# Patient Record
Sex: Male | Born: 1940 | Race: White | Hispanic: No | Marital: Married | Smoking: Former smoker
Health system: Southern US, Community
[De-identification: ages and names within clinical notes are randomized; demographics above are authoritative.]

## PROBLEM LIST (undated history)

## (undated) DIAGNOSIS — M519 Unspecified thoracic, thoracolumbar and lumbosacral intervertebral disc disorder: Secondary | ICD-10-CM

## (undated) DIAGNOSIS — N200 Calculus of kidney: Secondary | ICD-10-CM

## (undated) DIAGNOSIS — M069 Rheumatoid arthritis, unspecified: Secondary | ICD-10-CM

## (undated) DIAGNOSIS — I499 Cardiac arrhythmia, unspecified: Secondary | ICD-10-CM

## (undated) DIAGNOSIS — K279 Peptic ulcer, site unspecified, unspecified as acute or chronic, without hemorrhage or perforation: Secondary | ICD-10-CM

## (undated) DIAGNOSIS — E119 Type 2 diabetes mellitus without complications: Secondary | ICD-10-CM

## (undated) DIAGNOSIS — K219 Gastro-esophageal reflux disease without esophagitis: Secondary | ICD-10-CM

## (undated) DIAGNOSIS — G20A1 Parkinson's disease without dyskinesia, without mention of fluctuations: Secondary | ICD-10-CM

## (undated) DIAGNOSIS — J45909 Unspecified asthma, uncomplicated: Secondary | ICD-10-CM

## (undated) DIAGNOSIS — Z86711 Personal history of pulmonary embolism: Secondary | ICD-10-CM

## (undated) DIAGNOSIS — D472 Monoclonal gammopathy: Secondary | ICD-10-CM

## (undated) DIAGNOSIS — I219 Acute myocardial infarction, unspecified: Secondary | ICD-10-CM

## (undated) DIAGNOSIS — E039 Hypothyroidism, unspecified: Secondary | ICD-10-CM

## (undated) DIAGNOSIS — I2510 Atherosclerotic heart disease of native coronary artery without angina pectoris: Secondary | ICD-10-CM

## (undated) DIAGNOSIS — Z8249 Family history of ischemic heart disease and other diseases of the circulatory system: Secondary | ICD-10-CM

## (undated) DIAGNOSIS — I10 Essential (primary) hypertension: Secondary | ICD-10-CM

## (undated) DIAGNOSIS — K635 Polyp of colon: Secondary | ICD-10-CM

## (undated) DIAGNOSIS — M1990 Unspecified osteoarthritis, unspecified site: Secondary | ICD-10-CM

## (undated) DIAGNOSIS — E785 Hyperlipidemia, unspecified: Secondary | ICD-10-CM

## (undated) DIAGNOSIS — G20 Parkinson's disease: Secondary | ICD-10-CM

## (undated) DIAGNOSIS — D649 Anemia, unspecified: Secondary | ICD-10-CM

## (undated) DIAGNOSIS — Z87442 Personal history of urinary calculi: Secondary | ICD-10-CM

## (undated) DIAGNOSIS — N289 Disorder of kidney and ureter, unspecified: Secondary | ICD-10-CM

## (undated) DIAGNOSIS — Z86718 Personal history of other venous thrombosis and embolism: Secondary | ICD-10-CM

## (undated) DIAGNOSIS — R296 Repeated falls: Secondary | ICD-10-CM

## (undated) HISTORY — PX: CORONARY ANGIOPLASTY: SHX604

## (undated) HISTORY — DX: Family history of ischemic heart disease and other diseases of the circulatory system: Z82.49

## (undated) HISTORY — PX: JOINT REPLACEMENT: SHX530

## (undated) HISTORY — DX: Unspecified osteoarthritis, unspecified site: M19.90

## (undated) HISTORY — PX: ABLATION: SHX5711

## (undated) HISTORY — DX: Monoclonal gammopathy: D47.2

## (undated) HISTORY — DX: Polyp of colon: K63.5

## (undated) HISTORY — PX: NASAL/SINUS ENDOSCOPY: SHX288

## (undated) HISTORY — DX: Atherosclerotic heart disease of native coronary artery without angina pectoris: I25.10

## (undated) HISTORY — PX: REPLACEMENT TOTAL KNEE BILATERAL: SUR1225

## (undated) HISTORY — PX: CORONARY ARTERY BYPASS GRAFT: SHX141

## (undated) HISTORY — DX: Anemia, unspecified: D64.9

## (undated) HISTORY — DX: Personal history of other venous thrombosis and embolism: Z86.718

## (undated) HISTORY — DX: Gastro-esophageal reflux disease without esophagitis: K21.9

## (undated) HISTORY — PX: TOTAL HIP ARTHROPLASTY: SHX124

## (undated) HISTORY — PX: EYE SURGERY: SHX253

## (undated) HISTORY — DX: Disorder of kidney and ureter, unspecified: N28.9

## (undated) HISTORY — DX: Unspecified thoracic, thoracolumbar and lumbosacral intervertebral disc disorder: M51.9

## (undated) HISTORY — PX: COLONOSCOPY: SHX174

## (undated) HISTORY — DX: Peptic ulcer, site unspecified, unspecified as acute or chronic, without hemorrhage or perforation: K27.9

## (undated) HISTORY — DX: Hyperlipidemia, unspecified: E78.5

## (undated) HISTORY — DX: Rheumatoid arthritis, unspecified: M06.9

## (undated) HISTORY — PX: CARDIAC CATHETERIZATION: SHX172

## (undated) HISTORY — DX: Personal history of pulmonary embolism: Z86.711

## (undated) HISTORY — DX: Calculus of kidney: N20.0

## (undated) HISTORY — PX: CHOLECYSTECTOMY: SHX55

---

## 1996-03-17 DIAGNOSIS — Z8249 Family history of ischemic heart disease and other diseases of the circulatory system: Secondary | ICD-10-CM

## 1996-03-17 HISTORY — DX: Family history of ischemic heart disease and other diseases of the circulatory system: Z82.49

## 2004-02-19 ENCOUNTER — Ambulatory Visit: Payer: Self-pay | Admitting: Unknown Physician Specialty

## 2004-06-14 ENCOUNTER — Ambulatory Visit: Payer: Self-pay | Admitting: Otolaryngology

## 2004-06-19 ENCOUNTER — Ambulatory Visit: Payer: Self-pay | Admitting: Otolaryngology

## 2004-10-11 ENCOUNTER — Emergency Department: Payer: Self-pay | Admitting: General Practice

## 2004-10-23 ENCOUNTER — Ambulatory Visit: Payer: Self-pay | Admitting: Internal Medicine

## 2004-12-27 ENCOUNTER — Ambulatory Visit: Payer: Self-pay | Admitting: Unknown Physician Specialty

## 2005-01-10 ENCOUNTER — Ambulatory Visit: Payer: Self-pay | Admitting: Unknown Physician Specialty

## 2005-05-24 ENCOUNTER — Ambulatory Visit: Payer: Self-pay | Admitting: Ophthalmology

## 2006-06-22 ENCOUNTER — Other Ambulatory Visit: Payer: Self-pay

## 2006-06-22 ENCOUNTER — Emergency Department: Payer: Self-pay | Admitting: Emergency Medicine

## 2006-06-24 ENCOUNTER — Ambulatory Visit: Payer: Self-pay

## 2006-06-29 ENCOUNTER — Ambulatory Visit: Payer: Self-pay

## 2006-07-13 ENCOUNTER — Ambulatory Visit: Payer: Self-pay | Admitting: Rheumatology

## 2006-12-03 ENCOUNTER — Ambulatory Visit: Payer: Self-pay | Admitting: Cardiology

## 2007-02-01 ENCOUNTER — Other Ambulatory Visit: Payer: Self-pay

## 2007-02-01 ENCOUNTER — Ambulatory Visit: Payer: Self-pay | Admitting: Ophthalmology

## 2007-02-16 ENCOUNTER — Ambulatory Visit: Payer: Self-pay | Admitting: Ophthalmology

## 2007-03-18 DIAGNOSIS — Z86718 Personal history of other venous thrombosis and embolism: Secondary | ICD-10-CM

## 2007-03-18 HISTORY — DX: Personal history of other venous thrombosis and embolism: Z86.718

## 2007-03-22 ENCOUNTER — Ambulatory Visit: Payer: Self-pay | Admitting: Unknown Physician Specialty

## 2007-04-27 ENCOUNTER — Ambulatory Visit: Payer: Self-pay | Admitting: Ophthalmology

## 2007-05-11 ENCOUNTER — Ambulatory Visit: Payer: Self-pay | Admitting: Ophthalmology

## 2007-09-06 ENCOUNTER — Ambulatory Visit: Payer: Self-pay | Admitting: General Practice

## 2007-09-21 ENCOUNTER — Inpatient Hospital Stay: Payer: Self-pay | Admitting: General Practice

## 2007-11-19 ENCOUNTER — Inpatient Hospital Stay: Payer: Self-pay | Admitting: Specialist

## 2007-11-22 ENCOUNTER — Other Ambulatory Visit: Payer: Self-pay

## 2008-05-18 ENCOUNTER — Ambulatory Visit: Payer: Self-pay | Admitting: Vascular Surgery

## 2008-08-15 DIAGNOSIS — Z86711 Personal history of pulmonary embolism: Secondary | ICD-10-CM

## 2008-08-15 HISTORY — DX: Personal history of pulmonary embolism: Z86.711

## 2008-09-14 ENCOUNTER — Ambulatory Visit: Payer: Self-pay | Admitting: Internal Medicine

## 2008-10-03 ENCOUNTER — Ambulatory Visit: Payer: Self-pay | Admitting: Internal Medicine

## 2008-10-15 ENCOUNTER — Ambulatory Visit: Payer: Self-pay | Admitting: Internal Medicine

## 2009-01-03 ENCOUNTER — Ambulatory Visit: Payer: Self-pay | Admitting: Internal Medicine

## 2009-01-15 ENCOUNTER — Ambulatory Visit: Payer: Self-pay | Admitting: Internal Medicine

## 2009-04-05 ENCOUNTER — Ambulatory Visit: Payer: Self-pay | Admitting: Internal Medicine

## 2009-04-09 ENCOUNTER — Ambulatory Visit: Payer: Self-pay

## 2009-04-17 ENCOUNTER — Ambulatory Visit: Payer: Self-pay | Admitting: Internal Medicine

## 2009-05-16 ENCOUNTER — Ambulatory Visit: Payer: Self-pay | Admitting: Pain Medicine

## 2009-06-15 ENCOUNTER — Ambulatory Visit: Payer: Self-pay | Admitting: Internal Medicine

## 2009-07-04 ENCOUNTER — Ambulatory Visit: Payer: Self-pay | Admitting: Internal Medicine

## 2009-07-15 ENCOUNTER — Ambulatory Visit: Payer: Self-pay | Admitting: Internal Medicine

## 2009-08-02 ENCOUNTER — Ambulatory Visit: Payer: Self-pay | Admitting: Internal Medicine

## 2009-08-06 ENCOUNTER — Ambulatory Visit: Payer: Self-pay | Admitting: Internal Medicine

## 2009-08-15 ENCOUNTER — Ambulatory Visit: Payer: Self-pay | Admitting: Internal Medicine

## 2009-08-20 ENCOUNTER — Encounter: Admission: RE | Admit: 2009-08-20 | Discharge: 2009-08-20 | Payer: Self-pay | Admitting: Neurosurgery

## 2009-09-04 ENCOUNTER — Encounter: Admission: RE | Admit: 2009-09-04 | Discharge: 2009-09-04 | Payer: Self-pay | Admitting: Neurosurgery

## 2010-01-15 ENCOUNTER — Ambulatory Visit: Payer: Self-pay | Admitting: Internal Medicine

## 2010-08-21 ENCOUNTER — Ambulatory Visit: Payer: Self-pay | Admitting: General Practice

## 2010-08-30 ENCOUNTER — Ambulatory Visit: Payer: Self-pay | Admitting: Vascular Surgery

## 2010-09-02 ENCOUNTER — Inpatient Hospital Stay: Payer: Self-pay | Admitting: General Practice

## 2011-01-21 ENCOUNTER — Ambulatory Visit: Payer: Self-pay | Admitting: Vascular Surgery

## 2012-07-20 ENCOUNTER — Ambulatory Visit: Payer: Self-pay | Admitting: Neurology

## 2012-09-06 ENCOUNTER — Encounter: Payer: Self-pay | Admitting: Otolaryngology

## 2012-09-14 ENCOUNTER — Encounter: Payer: Self-pay | Admitting: Otolaryngology

## 2012-10-15 ENCOUNTER — Encounter: Payer: Self-pay | Admitting: Otolaryngology

## 2012-10-21 ENCOUNTER — Ambulatory Visit: Payer: Self-pay | Admitting: Cardiology

## 2012-10-27 ENCOUNTER — Ambulatory Visit: Payer: Self-pay | Admitting: Internal Medicine

## 2012-11-15 ENCOUNTER — Encounter: Payer: Self-pay | Admitting: Otolaryngology

## 2012-12-08 ENCOUNTER — Ambulatory Visit: Payer: Self-pay | Admitting: Unknown Physician Specialty

## 2012-12-20 ENCOUNTER — Other Ambulatory Visit: Payer: Self-pay | Admitting: Unknown Physician Specialty

## 2012-12-22 LAB — STOOL CULTURE

## 2013-01-26 ENCOUNTER — Ambulatory Visit: Payer: Self-pay | Admitting: Physical Medicine and Rehabilitation

## 2013-06-10 ENCOUNTER — Ambulatory Visit: Payer: Self-pay | Admitting: Internal Medicine

## 2013-06-21 ENCOUNTER — Ambulatory Visit: Payer: Self-pay | Admitting: Internal Medicine

## 2013-06-23 LAB — KAPPA/LAMBDA FREE LIGHT CHAINS (ARMC)

## 2013-06-23 LAB — PROT IMMUNOELECTROPHORES(ARMC)

## 2013-06-30 ENCOUNTER — Ambulatory Visit: Payer: Self-pay | Admitting: Unknown Physician Specialty

## 2013-07-01 LAB — PATHOLOGY REPORT

## 2013-07-15 ENCOUNTER — Ambulatory Visit: Payer: Self-pay | Admitting: Internal Medicine

## 2014-06-23 ENCOUNTER — Ambulatory Visit
Admit: 2014-06-23 | Disposition: A | Discharge status: Home / Self Care | Payer: Self-pay | Attending: Internal Medicine | Admitting: Internal Medicine

## 2014-06-23 LAB — CBC CANCER CENTER
BASOS ABS: 0.1 x10 3/mm (ref 0.0–0.1)
Basophil %: 1 %
EOS ABS: 0.2 x10 3/mm (ref 0.0–0.7)
Eosinophil %: 3.2 %
HCT: 41.8 % (ref 40.0–52.0)
HGB: 14 g/dL (ref 13.0–18.0)
LYMPHS ABS: 1.3 x10 3/mm (ref 1.0–3.6)
Lymphocyte %: 18.1 %
MCH: 31.3 pg (ref 26.0–34.0)
MCHC: 33.5 g/dL (ref 32.0–36.0)
MCV: 93 fL (ref 80–100)
MONO ABS: 0.7 x10 3/mm (ref 0.2–1.0)
Monocyte %: 9.4 %
NEUTROS PCT: 68.3 %
Neutrophil #: 5 x10 3/mm (ref 1.4–6.5)
PLATELETS: 217 x10 3/mm (ref 150–440)
RBC: 4.49 10*6/uL (ref 4.40–5.90)
RDW: 15.4 % — AB (ref 11.5–14.5)
WBC: 7.3 x10 3/mm (ref 3.8–10.6)

## 2014-06-23 LAB — URINALYSIS, COMPLETE
Bilirubin,UR: NEGATIVE
Blood: NEGATIVE
Glucose,UR: 50 mg/dL (ref 0–75)
KETONE: NEGATIVE
Nitrite: NEGATIVE
PH: 5 (ref 4.5–8.0)
SPECIFIC GRAVITY: 1.018 (ref 1.003–1.030)

## 2014-06-23 LAB — CALCIUM: Calcium, Total: 9.7 mg/dL

## 2014-06-23 LAB — CREATININE, SERUM
Creatinine: 1.59 mg/dL — ABNORMAL HIGH
EGFR (Non-African Amer.): 42 — ABNORMAL LOW
GFR CALC AF AMER: 48 — AB

## 2014-06-25 LAB — URINE CULTURE

## 2014-06-26 LAB — PROT IMMUNOELECTROPHORES(ARMC)

## 2015-06-18 ENCOUNTER — Emergency Department: Payer: Medicare Other

## 2015-06-18 ENCOUNTER — Inpatient Hospital Stay
Admission: EM | Admit: 2015-06-18 | Discharge: 2015-06-20 | DRG: 871 | Disposition: A | Discharge status: Home Health | Payer: Medicare Other | Attending: Internal Medicine | Admitting: Internal Medicine

## 2015-06-18 ENCOUNTER — Encounter: Payer: Self-pay | Admitting: Emergency Medicine

## 2015-06-18 DIAGNOSIS — I129 Hypertensive chronic kidney disease with stage 1 through stage 4 chronic kidney disease, or unspecified chronic kidney disease: Secondary | ICD-10-CM | POA: Diagnosis present

## 2015-06-18 DIAGNOSIS — Z7951 Long term (current) use of inhaled steroids: Secondary | ICD-10-CM | POA: Diagnosis not present

## 2015-06-18 DIAGNOSIS — N170 Acute kidney failure with tubular necrosis: Secondary | ICD-10-CM | POA: Diagnosis present

## 2015-06-18 DIAGNOSIS — E1122 Type 2 diabetes mellitus with diabetic chronic kidney disease: Secondary | ICD-10-CM | POA: Diagnosis present

## 2015-06-18 DIAGNOSIS — N183 Chronic kidney disease, stage 3 (moderate): Secondary | ICD-10-CM | POA: Diagnosis present

## 2015-06-18 DIAGNOSIS — Z7984 Long term (current) use of oral hypoglycemic drugs: Secondary | ICD-10-CM

## 2015-06-18 DIAGNOSIS — E872 Acidosis: Secondary | ICD-10-CM | POA: Diagnosis present

## 2015-06-18 DIAGNOSIS — Z7952 Long term (current) use of systemic steroids: Secondary | ICD-10-CM | POA: Diagnosis not present

## 2015-06-18 DIAGNOSIS — M069 Rheumatoid arthritis, unspecified: Secondary | ICD-10-CM | POA: Diagnosis present

## 2015-06-18 DIAGNOSIS — A419 Sepsis, unspecified organism: Secondary | ICD-10-CM | POA: Diagnosis present

## 2015-06-18 DIAGNOSIS — A047 Enterocolitis due to Clostridium difficile: Secondary | ICD-10-CM | POA: Diagnosis present

## 2015-06-18 DIAGNOSIS — A414 Sepsis due to anaerobes: Secondary | ICD-10-CM | POA: Diagnosis present

## 2015-06-18 DIAGNOSIS — Z794 Long term (current) use of insulin: Secondary | ICD-10-CM

## 2015-06-18 DIAGNOSIS — Z881 Allergy status to other antibiotic agents status: Secondary | ICD-10-CM | POA: Diagnosis not present

## 2015-06-18 DIAGNOSIS — N400 Enlarged prostate without lower urinary tract symptoms: Secondary | ICD-10-CM | POA: Diagnosis present

## 2015-06-18 DIAGNOSIS — R159 Full incontinence of feces: Secondary | ICD-10-CM | POA: Diagnosis present

## 2015-06-18 DIAGNOSIS — R6520 Severe sepsis without septic shock: Secondary | ICD-10-CM | POA: Diagnosis present

## 2015-06-18 DIAGNOSIS — I714 Abdominal aortic aneurysm, without rupture: Secondary | ICD-10-CM | POA: Diagnosis present

## 2015-06-18 DIAGNOSIS — Z79899 Other long term (current) drug therapy: Secondary | ICD-10-CM

## 2015-06-18 DIAGNOSIS — E876 Hypokalemia: Secondary | ICD-10-CM | POA: Diagnosis present

## 2015-06-18 DIAGNOSIS — I4891 Unspecified atrial fibrillation: Secondary | ICD-10-CM | POA: Diagnosis present

## 2015-06-18 DIAGNOSIS — N390 Urinary tract infection, site not specified: Secondary | ICD-10-CM | POA: Diagnosis present

## 2015-06-18 DIAGNOSIS — I2510 Atherosclerotic heart disease of native coronary artery without angina pectoris: Secondary | ICD-10-CM | POA: Diagnosis present

## 2015-06-18 DIAGNOSIS — Z7982 Long term (current) use of aspirin: Secondary | ICD-10-CM

## 2015-06-18 DIAGNOSIS — Z888 Allergy status to other drugs, medicaments and biological substances status: Secondary | ICD-10-CM

## 2015-06-18 DIAGNOSIS — R1084 Generalized abdominal pain: Secondary | ICD-10-CM

## 2015-06-18 HISTORY — DX: Type 2 diabetes mellitus without complications: E11.9

## 2015-06-18 HISTORY — DX: Essential (primary) hypertension: I10

## 2015-06-18 LAB — COMPREHENSIVE METABOLIC PANEL
ALBUMIN: 3.8 g/dL (ref 3.5–5.0)
ALK PHOS: 50 U/L (ref 38–126)
ALT: 29 U/L (ref 17–63)
AST: 33 U/L (ref 15–41)
Anion gap: 11 (ref 5–15)
BUN: 38 mg/dL — ABNORMAL HIGH (ref 6–20)
CALCIUM: 10.1 mg/dL (ref 8.9–10.3)
CHLORIDE: 105 mmol/L (ref 101–111)
CO2: 21 mmol/L — AB (ref 22–32)
CREATININE: 1.75 mg/dL — AB (ref 0.61–1.24)
GFR calc non Af Amer: 36 mL/min — ABNORMAL LOW (ref >60)
GFR, EST AFRICAN AMERICAN: 42 mL/min — AB (ref >60)
GLUCOSE: 246 mg/dL — AB (ref 65–99)
Potassium: 2.7 mmol/L — CL (ref 3.5–5.1)
SODIUM: 137 mmol/L (ref 135–145)
Total Bilirubin: 0.9 mg/dL (ref 0.3–1.2)
Total Protein: 7.2 g/dL (ref 6.5–8.1)

## 2015-06-18 LAB — URINALYSIS COMPLETE WITH MICROSCOPIC (ARMC ONLY)
Bilirubin Urine: NEGATIVE
Glucose, UA: 50 mg/dL — AB
KETONES UR: NEGATIVE mg/dL
NITRITE: NEGATIVE
PH: 5 (ref 5.0–8.0)
PROTEIN: 100 mg/dL — AB
SPECIFIC GRAVITY, URINE: 1.025 (ref 1.005–1.030)
Squamous Epithelial / LPF: NONE SEEN

## 2015-06-18 LAB — CBC WITH DIFFERENTIAL/PLATELET
BASOS PCT: 0 %
Basophils Absolute: 0.1 10*3/uL (ref 0–0.1)
EOS ABS: 0.1 10*3/uL (ref 0–0.7)
Eosinophils Relative: 1 %
HCT: 47.5 % (ref 40.0–52.0)
HEMOGLOBIN: 16 g/dL (ref 13.0–18.0)
LYMPHS ABS: 0.4 10*3/uL — AB (ref 1.0–3.6)
Lymphocytes Relative: 3 %
MCH: 30.3 pg (ref 26.0–34.0)
MCHC: 33.8 g/dL (ref 32.0–36.0)
MCV: 89.8 fL (ref 80.0–100.0)
MONOS PCT: 4 %
Monocytes Absolute: 0.5 10*3/uL (ref 0.2–1.0)
NEUTROS PCT: 92 %
Neutro Abs: 13.6 10*3/uL — ABNORMAL HIGH (ref 1.4–6.5)
Platelets: 202 10*3/uL (ref 150–440)
RBC: 5.29 MIL/uL (ref 4.40–5.90)
RDW: 16.7 % — ABNORMAL HIGH (ref 11.5–14.5)
WBC: 14.7 10*3/uL — ABNORMAL HIGH (ref 3.8–10.6)

## 2015-06-18 LAB — GLUCOSE, CAPILLARY: Glucose-Capillary: 151 mg/dL — ABNORMAL HIGH (ref 65–99)

## 2015-06-18 LAB — LIPASE, BLOOD: Lipase: 42 U/L (ref 11–51)

## 2015-06-18 LAB — RAPID INFLUENZA A&B ANTIGENS
Influenza A (ARMC): NEGATIVE
Influenza B (ARMC): NEGATIVE

## 2015-06-18 LAB — LACTIC ACID, PLASMA
Lactic Acid, Venous: 2.6 mmol/L (ref 0.5–2.0)
Lactic Acid, Venous: 3.6 mmol/L (ref 0.5–2.0)

## 2015-06-18 LAB — TROPONIN I: Troponin I: 0.08 ng/mL — ABNORMAL HIGH (ref <0.031)

## 2015-06-18 MED ORDER — DIATRIZOATE MEGLUMINE & SODIUM 66-10 % PO SOLN
15.0000 mL | Freq: Once | ORAL | Status: AC
  Administered 2015-06-18: 15 mL via ORAL

## 2015-06-18 MED ORDER — METHOTREXATE 2.5 MG PO TABS
12.5000 mg | ORAL_TABLET | ORAL | Status: DC
  Administered 2015-06-20: 12.5 mg via ORAL
  Filled 2015-06-18: qty 5

## 2015-06-18 MED ORDER — ENOXAPARIN SODIUM 40 MG/0.4ML ~~LOC~~ SOLN: 40.0000 mg | SUBCUTANEOUS | Status: DC

## 2015-06-18 MED ORDER — VANCOMYCIN 50 MG/ML ORAL SOLUTION
125.0000 mg | Freq: Four times a day (QID) | ORAL | Status: DC
  Administered 2015-06-19 (×2): 125 mg via ORAL
  Filled 2015-06-18 (×8): qty 2.5

## 2015-06-18 MED ORDER — FLUTICASONE PROPIONATE 50 MCG/ACT NA SUSP
2.0000 | Freq: Every day | NASAL | Status: DC
  Administered 2015-06-19 – 2015-06-20 (×2): 2 via NASAL
  Filled 2015-06-18 (×2): qty 16

## 2015-06-18 MED ORDER — ACETAMINOPHEN 325 MG PO TABS: 650.0000 mg | ORAL_TABLET | Freq: Four times a day (QID) | ORAL | Status: DC | PRN

## 2015-06-18 MED ORDER — LORATADINE 10 MG PO TABS
10.0000 mg | ORAL_TABLET | Freq: Every day | ORAL | Status: DC
  Administered 2015-06-19 – 2015-06-20 (×2): 10 mg via ORAL
  Filled 2015-06-18 (×2): qty 1

## 2015-06-18 MED ORDER — ONDANSETRON HCL 4 MG/2ML IJ SOLN: 4.0000 mg | Freq: Four times a day (QID) | INTRAMUSCULAR | Status: DC | PRN

## 2015-06-18 MED ORDER — ONDANSETRON HCL 4 MG/2ML IJ SOLN
4.0000 mg | Freq: Once | INTRAMUSCULAR | Status: AC
  Administered 2015-06-18: 4 mg via INTRAVENOUS
  Filled 2015-06-18: qty 2

## 2015-06-18 MED ORDER — GABAPENTIN 400 MG PO CAPS
400.0000 mg | ORAL_CAPSULE | Freq: Two times a day (BID) | ORAL | Status: DC
  Administered 2015-06-18 – 2015-06-20 (×4): 400 mg via ORAL
  Filled 2015-06-18 (×4): qty 1

## 2015-06-18 MED ORDER — HYDROXYCHLOROQUINE SULFATE 200 MG PO TABS
200.0000 mg | ORAL_TABLET | Freq: Every day | ORAL | Status: DC
  Administered 2015-06-19 – 2015-06-20 (×2): 200 mg via ORAL
  Filled 2015-06-18 (×2): qty 1

## 2015-06-18 MED ORDER — SIMVASTATIN 20 MG PO TABS
20.0000 mg | ORAL_TABLET | Freq: Every day | ORAL | Status: DC
  Administered 2015-06-18 – 2015-06-19 (×2): 20 mg via ORAL
  Filled 2015-06-18 (×2): qty 1

## 2015-06-18 MED ORDER — SODIUM CHLORIDE 0.9 % IV BOLUS (SEPSIS)
1000.0000 mL | Freq: Once | INTRAVENOUS | Status: AC
  Administered 2015-06-18: 1000 mL via INTRAVENOUS

## 2015-06-18 MED ORDER — INSULIN ASPART 100 UNIT/ML ~~LOC~~ SOLN
0.0000 [IU] | Freq: Three times a day (TID) | SUBCUTANEOUS | Status: DC
  Administered 2015-06-19 (×3): 2 [IU] via SUBCUTANEOUS
  Administered 2015-06-20: 1 [IU] via SUBCUTANEOUS
  Filled 2015-06-18 (×2): qty 2
  Filled 2015-06-18: qty 1
  Filled 2015-06-18: qty 2

## 2015-06-18 MED ORDER — IOPAMIDOL (ISOVUE-300) INJECTION 61%
75.0000 mL | Freq: Once | INTRAVENOUS | Status: AC | PRN
  Administered 2015-06-18: 75 mL via INTRAVENOUS

## 2015-06-18 MED ORDER — POTASSIUM CHLORIDE IN NACL 40-0.9 MEQ/L-% IV SOLN
INTRAVENOUS | Status: DC
  Administered 2015-06-18 – 2015-06-19 (×2): 100 mL/h via INTRAVENOUS
  Administered 2015-06-20: 50 mL/h via INTRAVENOUS
  Filled 2015-06-18 (×4): qty 1000

## 2015-06-18 MED ORDER — TAMSULOSIN HCL 0.4 MG PO CAPS
0.4000 mg | ORAL_CAPSULE | Freq: Every day | ORAL | Status: DC
  Administered 2015-06-19 – 2015-06-20 (×2): 0.4 mg via ORAL
  Filled 2015-06-18 (×2): qty 1

## 2015-06-18 MED ORDER — ACETAMINOPHEN 650 MG RE SUPP: 650.0000 mg | Freq: Four times a day (QID) | RECTAL | Status: DC | PRN

## 2015-06-18 MED ORDER — PREDNISONE 5 MG PO TABS
5.0000 mg | ORAL_TABLET | Freq: Every day | ORAL | Status: DC
  Administered 2015-06-19 – 2015-06-20 (×2): 5 mg via ORAL
  Filled 2015-06-18 (×2): qty 1

## 2015-06-18 MED ORDER — ASPIRIN EC 81 MG PO TBEC
81.0000 mg | DELAYED_RELEASE_TABLET | Freq: Every day | ORAL | Status: DC
  Administered 2015-06-19 – 2015-06-20 (×2): 81 mg via ORAL
  Filled 2015-06-18 (×2): qty 1

## 2015-06-18 MED ORDER — POTASSIUM CHLORIDE CRYS ER 20 MEQ PO TBCR
40.0000 meq | EXTENDED_RELEASE_TABLET | Freq: Once | ORAL | Status: AC
  Administered 2015-06-18: 40 meq via ORAL
  Filled 2015-06-18: qty 2

## 2015-06-18 MED ORDER — PANTOPRAZOLE SODIUM 40 MG PO TBEC
40.0000 mg | DELAYED_RELEASE_TABLET | Freq: Every day | ORAL | Status: DC
  Administered 2015-06-19 – 2015-06-20 (×2): 40 mg via ORAL
  Filled 2015-06-18 (×2): qty 1

## 2015-06-18 MED ORDER — ONDANSETRON HCL 4 MG PO TABS: 4.0000 mg | ORAL_TABLET | Freq: Four times a day (QID) | ORAL | Status: DC | PRN

## 2015-06-18 MED ORDER — FOLIC ACID 1 MG PO TABS
1.0000 mg | ORAL_TABLET | Freq: Every day | ORAL | Status: DC
  Administered 2015-06-19 – 2015-06-20 (×2): 1 mg via ORAL
  Filled 2015-06-18 (×3): qty 1

## 2015-06-18 MED ORDER — VANCOMYCIN HCL IN DEXTROSE 1-5 GM/200ML-% IV SOLN
1000.0000 mg | Freq: Once | INTRAVENOUS | Status: AC
  Administered 2015-06-18: 1000 mg via INTRAVENOUS
  Filled 2015-06-18: qty 200

## 2015-06-18 MED ORDER — LORAZEPAM 0.5 MG PO TABS: 0.5000 mg | ORAL_TABLET | Freq: Two times a day (BID) | ORAL | Status: DC | PRN

## 2015-06-18 MED ORDER — ATENOLOL 50 MG PO TABS
25.0000 mg | ORAL_TABLET | Freq: Every day | ORAL | Status: DC
  Administered 2015-06-19 – 2015-06-20 (×2): 25 mg via ORAL
  Filled 2015-06-18 (×2): qty 1

## 2015-06-18 MED ORDER — PIPERACILLIN-TAZOBACTAM 3.375 G IVPB
3.3750 g | Freq: Once | INTRAVENOUS | Status: AC
Start: 2015-06-18 — End: 2015-06-18
  Administered 2015-06-18: 3.375 g via INTRAVENOUS
  Filled 2015-06-18: qty 50

## 2015-06-18 NOTE — ED Notes (Signed)
Fever began yesterday, weak today. Denies cough. States has had burning with urination.

## 2015-06-18 NOTE — ED Notes (Signed)
Patient has had liquid yellow stool x 3 during ER stay. Soaked into diaper and unable to get specimen. Kept on enteric precautions. Pt speaks coherently when spoken to, cont weak and confused at time. Abdominal pain decreasing. Brother at bedside.

## 2015-06-18 NOTE — ED Provider Notes (Signed)
[facility] Emergency Department Provider Note  ____________________________________________  Time seen: Approximately 11:33 AM  I have reviewed the triage vital signs and the nursing notes.   HISTORY  Chief Complaint Code Sepsis    HPI Sean Armstrong is a 75 y.o. male with hypertension, diabetes, coronary artery disease who presents for evaluation of fever, nausea, vomiting, diarrhea since yesterday, gradual onset, constant since onset, severe. No coughing, sneezing, runny nose or congestion. He has had dysuria. He is complaining of diffuse abdominal pain.   Past Medical History  Diagnosis Date  . Diabetes mellitus without complication ([place])   . Hypertension     There are no active problems to display for this patient.   History reviewed. No pertinent past surgical history.  Current Outpatient Rx  Name  Route  Sig  Dispense  Refill  . aspirin EC 81 MG tablet   Oral   Take 81 mg by mouth daily.         Marland Kitchen atenolol (TENORMIN) 25 MG tablet   Oral   Take 25 mg by mouth daily.         . cetirizine (ZYRTEC) 10 MG tablet   Oral   Take 10 mg by mouth daily.         . fluticasone (FLONASE) 50 MCG/ACT nasal spray   Each Nare   Place 2 sprays into both nostrils daily.         . folic acid (FOLVITE) 1 MG tablet   Oral   Take 1 mg by mouth daily.         Marland Kitchen gabapentin (NEURONTIN) 400 MG capsule   Oral   Take 400 mg by mouth 2 (two) times daily.          Marland Kitchen glimepiride (AMARYL) 2 MG tablet   Oral   Take 2 mg by mouth 2 (two) times daily.          . hydroxychloroquine (PLAQUENIL) 200 MG tablet   Oral   Take 200 mg by mouth daily.         . Insulin Detemir (LEVEMIR FLEXTOUCH) 100 UNIT/ML Pen   Subcutaneous   Inject 48 Units into the skin at bedtime.         Marland Kitchen LORazepam (ATIVAN) 0.5 MG tablet   Oral   Take 0.5 mg by mouth 2 (two) times daily as needed for anxiety.          . methotrexate (RHEUMATREX) 2.5 MG tablet    Oral   Take 12.5 mg by mouth once a week. Pt takes on Wednesday.  Caution:Chemotherapy. Protect from light.         Marland Kitchen omeprazole (PRILOSEC) 20 MG capsule   Oral   Take 40 mg by mouth daily.         . potassium chloride SA (K-DUR,KLOR-CON) 20 MEQ tablet   Oral   Take 40 mEq by mouth daily.          . predniSONE (DELTASONE) 5 MG tablet   Oral   Take 5 mg by mouth daily with breakfast.         . simvastatin (ZOCOR) 20 MG tablet   Oral   Take 20 mg by mouth at bedtime.         . tamsulosin (FLOMAX) 0.4 MG CAPS capsule   Oral   Take 0.4 mg by mouth daily after breakfast.          . triamterene-hydrochlorothiazide (MAXZIDE) 75-50 MG tablet  Oral   Take 1 tablet by mouth daily.           Allergies Gentamycin and Propranolol  No family history on file.  Social History Social History  Substance Use Topics  . Smoking status: Never Smoker   . Smokeless tobacco: None  . Alcohol Use: No    Review of Systems Constitutional: + fever/chills Eyes: No visual changes. ENT: No sore throat. Cardiovascular: Denies chest pain. Respiratory: Denies shortness of breath. Gastrointestinal: +abdominal pain.  + nausea, + vomiting.  + diarrhea.  No constipation. Genitourinary: Negative for dysuria. Musculoskeletal: Negative for back pain. Skin: Negative for rash. Neurological: Negative for headaches, focal weakness or numbness.  10-point ROS otherwise negative.  ____________________________________________   PHYSICAL EXAM:  VITAL SIGNS: ED Triage Vitals  Enc Vitals Group     BP 06/18/15 1100 117/86 mmHg     Pulse Rate 06/18/15 1100 133     Resp 06/18/15 1100 20     Temp 06/18/15 1100 101.2 F (38.4 C)     Temp Source 06/18/15 1100 Oral     SpO2 06/18/15 1100 95 %     Weight 06/18/15 1100 189 lb (85.73 kg)     Height 06/18/15 1100 5\' 8"  (1.727 m)     Head Cir --      Peak Flow --      Pain Score --      Pain Loc --      Pain Edu? --      Excl. in [place]? --      Constitutional: Alert and oriented. Ill-appearing and fatigued but in no acute distress. Eyes: Conjunctivae are normal. PERRL. EOMI. Head: Atraumatic. Nose: No congestion/rhinnorhea. Mouth/Throat: Mucous membranes are dry.  Oropharynx non-erythematous. Neck: No stridor.  Supple without meningismus. Cardiovascular: Normal rate, regular rhythm. Grossly normal heart sounds.  Good peripheral circulation. Respiratory: Normal respiratory effort.  No retractions. Lungs CTAB. Gastrointestinal: Soft with diffuse moderate tenderness to palpation. No CVA tenderness. Genitourinary: Deferred Musculoskeletal: No lower extremity tenderness nor edema.  No joint effusions. Neurologic:  Normal speech and language. No gross focal neurologic deficits are appreciated.  Skin:  Skin is warm, dry and intact. No rash noted. Psychiatric: Mood and affect are normal. Speech and behavior are normal.  ____________________________________________   LABS (all labs ordered are listed, but only abnormal results are displayed)  Labs Reviewed  COMPREHENSIVE METABOLIC PANEL - Abnormal; Notable for the following:    Potassium 2.7 (*)    CO2 21 (*)    Glucose, Bld 246 (*)    BUN 38 (*)    Creatinine, Ser 1.75 (*)    GFR calc non Af Amer 36 (*)    GFR calc Af Amer 42 (*)    All other components within normal limits  CBC WITH DIFFERENTIAL/PLATELET - Abnormal; Notable for the following:    WBC 14.7 (*)    RDW 16.7 (*)    Neutro Abs 13.6 (*)    Lymphs Abs 0.4 (*)    All other components within normal limits  URINALYSIS COMPLETEWITH MICROSCOPIC ([facility] ONLY) - Abnormal; Notable for the following:    Color, Urine AMBER (*)    APPearance HAZY (*)    Glucose, UA 50 (*)    Hgb urine dipstick 2+ (*)    Protein, ur 100 (*)    Leukocytes, UA TRACE (*)    Bacteria, UA RARE (*)    All other components within normal limits  LACTIC ACID, PLASMA - Abnormal; Notable for the  following:    Lactic Acid, Venous 2.6 (*)     All other components within normal limits  TROPONIN I - Abnormal; Notable for the following:    Troponin I 0.08 (*)    All other components within normal limits  CULTURE, BLOOD (ROUTINE X 2)  CULTURE, BLOOD (ROUTINE X 2)  URINE CULTURE  RAPID INFLUENZA A&B ANTIGENS ([facility] ONLY)  LIPASE, BLOOD  LACTIC ACID, PLASMA   ____________________________________________  EKG  ED ECG REPORT I, Joanne Gavel, the attending physician, personally viewed and interpreted this ECG.   Date: 06/18/2015  EKG Time: 11:05  Rate: 135  Rhythm: atrial tachycardia  Axis: normal  Intervals:none  ST&T Change: No acute ST elevation. ST depression in lead 2, aVF, V5, V6.   ED ECG REPORT I, Joanne Gavel, the attending physician, personally viewed and interpreted this ECG.   Date: 06/18/2015  EKG Time: 14:41  Rate: 81  Rhythm: atrial fibrillation, rate 81  Axis: normal  Intervals:none  ST&T Change: No acute ST elevation. Q waves in V1, V2, V3.   ____________________________________________  RADIOLOGY   CT abdomen and pelvis IMPRESSION: No extra luminal bowel inflammatory process, free fluid or free air is noted. Portions of the colon appear fluid filled.  Complex lower abdominal aortic aneurysm with maximal transverse dimension of 4.2 x 3.1 cm versus prior 3.7 x 2.7 cm. Prominent plaque within the abdominal aortic aneurysm. Prominent plaque lower thoracic/ upper abdominal aorta placing the patient at risk for shower of emboli. Prominent coronary artery calcifications.  Postcholecystectomy.  Fatty infiltration liver.  Nonspecific small cysts pancreatic tail region without change (measuring up to 1.1 cm). Stability can be confirmed on follow-up in 6-12 months.  Multiple bilateral renal low-density structures, larger ones which are cysts. Others too small to characterize. Nonobstructing right lower pole renal calculus. Scarring right lower pole may be related to prior obstruction or  infection.  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 Slightly prominent size portacaval lymph node unchanged.  Enlarged prostate gland. Impression upon the bladder base. Clinical and laboratory correlation.  Prominent degenerative changes throughout the lower thoracic and lumbar spine with high-grade stenosis L3-4 level.  CXR IMPRESSION: No active disease.   ____________________________________________   PROCEDURES  Procedure(s) performed: None  Critical Care performed: Yes, see critical care note(s). Total critical care time spent 35 minutes.  ____________________________________________   INITIAL IMPRESSION / ASSESSMENT AND PLAN / ED COURSE  Pertinent labs & imaging results that were available during my care of the patient were reviewed by me and considered in my medical decision making (see chart for details).  NELTON NIP is a 75 y.o. male with hypertension, diabetes, coronary artery disease who presents for evaluation of fever, nausea, vomiting, diarrhea since yesterday. On exam, he is ill-appearing and fatigued. He is needing 3 out of 4 Sirs criteria for fever, tachycardia, tachypnea. Code sepsis initially on his arrival. We will give IV fluids, IV vancomycin and Zosyn. We'll obtain labs, chest x-ray, urinalysis and CT of the abdomen and pelvis and anticipate admission. EKG showed atrial tachycardia which was noted to be a.fib when the rate slowed.  ----------------------------------------- 3:32 PM on 06/18/2015 ----------------------------------------- On Chart review, atrial fibrillation appears to be new today. Patient has no history of A. fib. Labs are reviewed. CBC with leukocytosis, CMP with hypokalemia, we'll replete. Creatinine mildly elevated at 1.75. Urinalysis with trace leukocytes, numerous red blood cells and some white blood cells, this may  represent a urinary traction infection or  could be secondary to traumatic specimen. We'll send urine cultures. Lactic acid was elevated at 2.6. I discussed the CT scan of the abdomen and pelvis with Dr. Ronalee Belts of vascular surgery who has reviewed the images, recommends no acute intervention for the noted AAA which is slightly increased in size when compared to the prior scan. He also reports that the SMA and celiac arteries are widely patent, there is no clinical concern for mesenteric ischemia. Case discussed with hospitalist at this time for admission. Patient received 2 L of IV fluids in the emergency department, he is mentating appropriately, maintaining blood pressure 126/89 at this time, his tachycardia has resolved, current heart rate is 75 bpm. Will not give full 30 mL/kg bolus of normal saline given his age, comorbidities, risk of precipitating flash pulmonary edema.   ____________________________________________   FINAL CLINICAL IMPRESSION(S) / ED DIAGNOSES  Final diagnoses:  Sepsis secondary to UTI [facility])  Generalized abdominal pain  Atrial fibrillation, unspecified type (H[facility])      Joanne Gavel, MD 06/18/15 1536

## 2015-06-18 NOTE — H&P (Signed)
[facility] [place] NAME: [name]    MR#:  [id number]  DATE OF BIRTH:  [birth year]  DATE OF ADMISSION:  06/18/2015  PRIMARY CARE PHYSICIAN: No primary care provider on file.   REQUESTING/REFERRING PHYSICIAN: Dr.Eryka gayle  CHIEF COMPLAINT:   Chief Complaint  Patient presents with  . Code Sepsis    HISTORY OF PRESENT ILLNESS:  Sean Armstrong  is a 75 y.o. male with a known history of attention, diabetes mellitus, rheumatoid arthritis, GERD and by the family because of severe diarrhea, vomiting since the holidays morning around 3 AM. Patient had about 3-5 episodes of vomiting associated with perfuse diarrhea with incontinence. Patient also was confused to according to the brother. The temperature was 103 Fahrenheit when the EMS arrived this morning. And in the ER temperature is 101.2 Fahrenheit. Patient has lactic acidosis with a lactic acid of 3.6. As also white count 15.7 with BUN/creatinine 38/1.75, patient right now is alert. He was confused this morning because of fever but after the fever came down and he became more alert. Patienthe is. Denies any stomach pain. And following commands. According to the brother patient was normal yesterday. The patient for severe sepsis with acute renal failure. Patient has diarrhea and the stool smells like C. difficile but unable to obtain the sample because of incontinence.  PAST MEDICAL HISTORY:   Past Medical History  Diagnosis Date  . Diabetes mellitus without complication ([place])   . Hypertension     PAST SURGICAL HISTOIRY:  History reviewed. No pertinent past surgical history.  SOCIAL HISTORY:   Social History  Substance Use Topics  . Smoking status: Never Smoker   . Smokeless tobacco: Not on file  . Alcohol Use: No    FAMILY HISTORY:  No family history on file.  DRUG ALLERGIES:   Allergies  Allergen Reactions  . Gentamycin [Gentamicin] Other (See Comments)    Reaction:   Caused kidney issues for pt   . Propranolol Palpitations    REVIEW OF SYSTEMS:  CONSTITUTIONAL ; High fever today.  EYES: No blurred or double vision.  EARS, NOSE, AND THROAT: No tinnitus or ear pain.  RESPIRATORY: No cough, shortness of breath, wheezing or hemoptysis.  CARDIOVASCULAR: No chest pain, orthopnea, edema.  GASTROINTESTINAL:As nausea, vomiting, diarrhea since early morning.Marland Kitchen  GENITOURINARY: No dysuria, hematuria.  ENDOCRINE: No polyuria, nocturia,  HEMATOLOGY: No anemia, easy bruising or bleeding SKIN: No rash or lesion. MUSCULOSKELETAL: No joint pain or arthritis.   NEUROLOGIC: No tingling, numbness, weakness.  PSYCHIATRY: No anxiety or depression.   MEDICATIONS AT HOME:   Prior to Admission medications   Medication Sig Start Date End Date Taking? Authorizing Provider  aspirin EC 81 MG tablet Take 81 mg by mouth daily.   Yes Historical Provider, MD  atenolol (TENORMIN) 25 MG tablet Take 25 mg by mouth daily.   Yes Historical Provider, MD  cetirizine (ZYRTEC) 10 MG tablet Take 10 mg by mouth daily.   Yes Historical Provider, MD  fluticasone (FLONASE) 50 MCG/ACT nasal spray Place 2 sprays into both nostrils daily.   Yes Historical Provider, MD  folic acid (FOLVITE) 1 MG tablet Take 1 mg by mouth daily.   Yes Historical Provider, MD  gabapentin (NEURONTIN) 400 MG capsule Take 400 mg by mouth 2 (two) times daily.    Yes Historical Provider, MD  glimepiride (AMARYL) 2 MG tablet Take 2 mg by mouth 2 (two) times daily.    Yes Historical Provider,  MD  hydroxychloroquine (PLAQUENIL) 200 MG tablet Take 200 mg by mouth daily.   Yes Historical Provider, MD  Insulin Detemir (LEVEMIR FLEXTOUCH) 100 UNIT/ML Pen Inject 48 Units into the skin at bedtime.   Yes Historical Provider, MD  LORazepam (ATIVAN) 0.5 MG tablet Take 0.5 mg by mouth 2 (two) times daily as needed for anxiety.    Yes Historical Provider, MD  methotrexate (RHEUMATREX) 2.5 MG tablet Take 12.5 mg by mouth once a week. Pt  takes on Wednesday.  Caution:Chemotherapy. Protect from light.   Yes Historical Provider, MD  omeprazole (PRILOSEC) 20 MG capsule Take 40 mg by mouth daily.   Yes Historical Provider, MD  potassium chloride SA (K-DUR,KLOR-CON) 20 MEQ tablet Take 40 mEq by mouth daily.    Yes Historical Provider, MD  predniSONE (DELTASONE) 5 MG tablet Take 5 mg by mouth daily with breakfast.   Yes Historical Provider, MD  simvastatin (ZOCOR) 20 MG tablet Take 20 mg by mouth at bedtime.   Yes Historical Provider, MD  tamsulosin (FLOMAX) 0.4 MG CAPS capsule Take 0.4 mg by mouth daily after breakfast.    Yes Historical Provider, MD  triamterene-hydrochlorothiazide (MAXZIDE) 75-50 MG tablet Take 1 tablet by mouth daily.   Yes Historical Provider, MD      VITAL SIGNS:  Blood pressure 128/68, pulse 77, temperature 99.6 F (37.6 C), temperature source Oral, resp. rate 20, height 5\' 8"  (1.727 m), weight 85.73 kg (189 lb), SpO2 100 %.  PHYSICAL EXAMINATION:  GENERAL:  75 y.o.-year-old patient lying in the bed ,clinically  appears dry.  EYES: Pupils equal, round, reactive to light and accommodation. No scleral icterus. Extraocular muscles intact.  HEENT: Head atraumatic, normocephalic. Oropharynx and nasopharynx clear.  NECK:  Supple, no jugular venous distention. No thyroid enlargement, no tenderness.  LUNGS: Normal breath sounds bilaterally, no wheezing, rales,rhonchi or crepitation. No use of accessory muscles of respiration.  CARDIOVASCULAR: S1, S2 normal.Tachycardic.o murmurs, rubs, or gallops.  ABDOMEN: Soft, nontender, nondistended. Bowel sounds present. No organomegaly or mass.  EXTREMITIES: No pedal edema, cyanosis, or clubbing.  NEUROLOGIC: Cranial nerves II through XII are intact. Muscle strength 5/5 in all extremities. Sensation intact. Gait not checked.  PSYCHIATRIC: The patient is alert and oriented x 3.  SKIN: No obvious rash, lesion, or ulcer.   LABORATORY PANEL:   CBC  Recent Labs Lab  06/18/15 1113  WBC 14.7*  HGB 16.0  HCT 47.5  PLT 202   ------------------------------------------------------------------------------------------------------------------  Chemistries   Recent Labs Lab 06/18/15 1113  NA 137  K 2.7*  CL 105  CO2 21*  GLUCOSE 246*  BUN 38*  CREATININE 1.75*  CALCIUM 10.1  AST 33  ALT 29  ALKPHOS 50  BILITOT 0.9   ------------------------------------------------------------------------------------------------------------------  Cardiac Enzymes  Recent Labs Lab 06/18/15 1113  TROPONINI 0.08*   ------------------------------------------------------------------------------------------------------------------  RADIOLOGY:  Dg Chest 1 View  06/18/2015  CLINICAL DATA:  Fever yesterday, weakness today. EXAM: CHEST 1 VIEW COMPARISON:  11/19/2007 FINDINGS: The heart size and mediastinal contours are within normal limits. Both lungs are clear. The visualized skeletal structures are unremarkable. IMPRESSION: No active disease. Electronically Signed   By: Rolm Baptise M.D.   On: 06/18/2015 12:16   Ct Abdomen Pelvis W Contrast  06/18/2015  CLINICAL DATA:  75 year old hypertensive diabetic male with fever and weakness. Burning with urination. Initial encounter. EXAM: CT ABDOMEN AND PELVIS WITH CONTRAST TECHNIQUE: Multidetector CT imaging of the abdomen and pelvis was performed using the standard protocol following bolus administration of intravenous  contrast. CONTRAST:  30mL ISOVUE-300 IOPAMIDOL (ISOVUE-300) INJECTION 61% COMPARISON:  12/08/2012 CT. FINDINGS: Lower chest: Tiny nodule left lung base unchanged. No worrisome abnormality. Hepatobiliary: Postcholecystectomy. Minimally lobulated contour of the liver without other findings to suggest cirrhosis. Fatty infiltration of the liver without worrisome mass. Pancreas: Nonspecific small cysts pancreatic tail region without change (measuring up to 1.1 cm). Spleen: Nonspecific peripheral calcifications without  worrisome mass. Adrenals/Urinary Tract: Multiple bilateral renal low-density structures larger ones which are cysts. Other ones too small to characterize. Nonobstructing right lower pole renal calculus. Scarring right lower pole may be related to prior obstruction or infection. No adrenal lesion. Stomach/Bowel: Underdistended stomach without gross abnormality noted. No extra luminal bowel inflammatory process, free fluid or free air. Specifically, no inflammation surrounds the appendix. Vascular/Lymphatic: Complex lower abdominal aortic aneurysm with maximal transverse dimension of 4.2 x 3.1 cm versus prior 3.7 x 2.7 cm. Prominent plaque within the abdominal aortic aneurysm. Prominent plaque lower thoracic/ upper abdominal aorta placing the patient at risk for shower of emboli. Prominent coronary artery calcifications. 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 Slightly prominent size portacaval lymph node unchanged. Reproductive: Enlarged prostate gland. Impression upon the bladder base. Clinical and laboratory correlation. Evaluation urinary bladder limited by artifact caused by hip replacement. Other: Fatty containing right inguinal hernia. Musculoskeletal: Post right hip with streak artifact. Prominent degenerative changes throughout the lower thoracic and lumbar spine with high-grade stenosis L3-4 level. IMPRESSION: No extra luminal bowel inflammatory process, free fluid or free air is noted. Portions of the colon appear fluid filled. Complex lower abdominal aortic aneurysm with maximal transverse dimension of 4.2 x 3.1 cm versus prior 3.7 x 2.7 cm. Prominent plaque within the abdominal aortic aneurysm. Prominent plaque lower thoracic/ upper abdominal aorta placing the patient at risk for shower of emboli. Prominent coronary artery calcifications. Postcholecystectomy. Fatty infiltration liver. Nonspecific small cysts pancreatic tail region  without change (measuring up to 1.1 cm). Stability can be confirmed on follow-up in 6-12 months. Multiple bilateral renal low-density structures, larger ones which are cysts. Others too small to characterize. Nonobstructing right lower pole renal calculus. Scarring right lower pole may be related to prior obstruction or infection. 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 Slightly prominent size portacaval lymph node unchanged. Enlarged prostate gland. Impression upon the bladder base. Clinical and laboratory correlation. Prominent degenerative changes throughout the lower thoracic and lumbar spine with high-grade stenosis L3-4 level. Electronically Signed   By: Genia Del M.D.   On: 06/18/2015 14:57    EKG:   Orders placed or performed during the hospital encounter of 06/18/15  . EKG 12-Lead  . EKG 12-Lead   EKG shows atrial tachycardia with heart rate in 90s.  IMPRESSION AND PLAN:  1.. Sepsis present on admission likely secondary to C. difficile colitis: Patient has severe diarrhea and vomiting this morning. Unable to send a stool sample because of incontinence. Empirically give the treatment for C. difficile colitis with by mouth vancomycin, continue enteric precautions. Follow  blood cultures, urine cultures, urine cultures. monitor on telemetry. #2 evidence of sepsis patient has fever, elevated white count, acute renal failure and hypokalemia and lactic acid 3.6 on admission #3 acute renal failure due to ATN from sepsis: Continue IV hydration. #4 chronic kidney disease: Likely stage III with baseline creatinine around 1.59. #5 diabetes mellitus type 2: Hold the remainder because of significant nausea vomiting diarrhea and poor by  mouth intake: Start the patient on clear liquids. And use the sliding scale with coverage only. #6 significant hypokalemia secondary to GI losses: Replace the potassium. History of rheumatoid  arthritis: Patient is on black male, prednisone, folic acid, methotrexate. #7.essential hypertension: Controlled continue atenolol.  8 history of chronic cystitis, BPH: Patient sees Dr. cope as an outpatient.  All the records are reviewed and case discussed with ED provider. Management plans discussed with the patient, family and they are in agreement.  CODE STATUS: Full  TOTAL TIME TAKING CARE OF THIS PATIENT: 63minutes. Critical care  Maicey Barrientez M.D on 06/18/2015 at 5:02 PM  Between 7am to 6pm - Pager - [phone]  After 6pm go to [email or web address] - password [email or web address] [facility]  [place]  Office  [phone]  CC: Primary care physician; No primary care provider on file.  Note: This dictation was prepared with Dragon dictation along with smaller phrase technology. Any transcriptional errors that result from this process are unintentional.

## 2015-06-19 LAB — CBC
HCT: 41.1 % (ref 40.0–52.0)
Hemoglobin: 14 g/dL (ref 13.0–18.0)
MCH: 30.8 pg (ref 26.0–34.0)
MCHC: 34 g/dL (ref 32.0–36.0)
MCV: 90.6 fL (ref 80.0–100.0)
PLATELETS: 143 10*3/uL — AB (ref 150–440)
RBC: 4.53 MIL/uL (ref 4.40–5.90)
RDW: 17 % — AB (ref 11.5–14.5)
WBC: 9.9 10*3/uL (ref 3.8–10.6)

## 2015-06-19 LAB — GASTROINTESTINAL PANEL BY PCR, STOOL (REPLACES STOOL CULTURE)
ADENOVIRUS F40/41: NOT DETECTED
ASTROVIRUS: NOT DETECTED
CAMPYLOBACTER SPECIES: NOT DETECTED
Cryptosporidium: NOT DETECTED
Cyclospora cayetanensis: NOT DETECTED
E. coli O157: NOT DETECTED
ENTEROAGGREGATIVE E COLI (EAEC): NOT DETECTED
ENTEROPATHOGENIC E COLI (EPEC): NOT DETECTED
ENTEROTOXIGENIC E COLI (ETEC): NOT DETECTED
Entamoeba histolytica: NOT DETECTED
GIARDIA LAMBLIA: NOT DETECTED
NOROVIRUS GI/GII: DETECTED — AB
PLESIMONAS SHIGELLOIDES: NOT DETECTED
ROTAVIRUS A: NOT DETECTED
SHIGA LIKE TOXIN PRODUCING E COLI (STEC): NOT DETECTED
Salmonella species: NOT DETECTED
Sapovirus (I, II, IV, and V): NOT DETECTED
Shigella/Enteroinvasive E coli (EIEC): NOT DETECTED
Vibrio cholerae: NOT DETECTED
Vibrio species: NOT DETECTED
YERSINIA ENTEROCOLITICA: NOT DETECTED

## 2015-06-19 LAB — BASIC METABOLIC PANEL
Anion gap: 6 (ref 5–15)
BUN: 32 mg/dL — AB (ref 6–20)
CHLORIDE: 109 mmol/L (ref 101–111)
CO2: 24 mmol/L (ref 22–32)
CREATININE: 1.6 mg/dL — AB (ref 0.61–1.24)
Calcium: 8.8 mg/dL — ABNORMAL LOW (ref 8.9–10.3)
GFR calc Af Amer: 46 mL/min — ABNORMAL LOW (ref >60)
GFR calc non Af Amer: 40 mL/min — ABNORMAL LOW (ref >60)
GLUCOSE: 148 mg/dL — AB (ref 65–99)
Potassium: 2.9 mmol/L — CL (ref 3.5–5.1)
SODIUM: 139 mmol/L (ref 135–145)

## 2015-06-19 LAB — GLUCOSE, CAPILLARY
GLUCOSE-CAPILLARY: 97 mg/dL (ref 65–99)
Glucose-Capillary: 172 mg/dL — ABNORMAL HIGH (ref 65–99)
Glucose-Capillary: 194 mg/dL — ABNORMAL HIGH (ref 65–99)
Glucose-Capillary: 168 mg/dL — ABNORMAL HIGH (ref 65–99)

## 2015-06-19 LAB — C DIFFICILE QUICK SCREEN W PCR REFLEX
C DIFFICILE (CDIFF) TOXIN: NEGATIVE
C DIFFICLE (CDIFF) ANTIGEN: NEGATIVE
C Diff interpretation: NEGATIVE

## 2015-06-19 LAB — LACTIC ACID, PLASMA
Lactic Acid, Venous: 1.6 mmol/L (ref 0.5–2.0)
Lactic Acid, Venous: 2 mmol/L (ref 0.5–2.0)

## 2015-06-19 LAB — POTASSIUM: POTASSIUM: 3.9 mmol/L (ref 3.5–5.1)

## 2015-06-19 LAB — MAGNESIUM: Magnesium: 1.7 mg/dL (ref 1.7–2.4)

## 2015-06-19 MED ORDER — POTASSIUM CHLORIDE CRYS ER 20 MEQ PO TBCR
40.0000 meq | EXTENDED_RELEASE_TABLET | ORAL | Status: AC
  Administered 2015-06-19 (×2): 40 meq via ORAL
  Filled 2015-06-19 (×2): qty 2

## 2015-06-19 MED ORDER — POTASSIUM CHLORIDE CRYS ER 20 MEQ PO TBCR: 40.0000 meq | EXTENDED_RELEASE_TABLET | ORAL | Status: DC

## 2015-06-19 MED ORDER — INSULIN DETEMIR 100 UNIT/ML ~~LOC~~ SOLN
15.0000 [IU] | Freq: Every day | SUBCUTANEOUS | Status: DC
  Administered 2015-06-19 – 2015-06-20 (×2): 15 [IU] via SUBCUTANEOUS
  Filled 2015-06-19 (×2): qty 0.15

## 2015-06-19 MED ORDER — MAGNESIUM SULFATE 2 GM/50ML IV SOLN
2.0000 g | Freq: Once | INTRAVENOUS | Status: AC
  Administered 2015-06-19: 2 g via INTRAVENOUS
  Filled 2015-06-19: qty 50

## 2015-06-19 MED ORDER — ENOXAPARIN SODIUM 40 MG/0.4ML ~~LOC~~ SOLN
40.0000 mg | SUBCUTANEOUS | Status: DC
  Administered 2015-06-19 – 2015-06-20 (×2): 40 mg via SUBCUTANEOUS
  Filled 2015-06-19 (×2): qty 0.4

## 2015-06-19 NOTE — Progress Notes (Signed)
Pt potassium level 2.9 this AM. MD Pyreddy notified. No new orders given at this time. Will continue to monitor.   Iran Sizer M

## 2015-06-19 NOTE — Care Management (Signed)
Admitted with fever and weakness, vomiting and diarrhea.  Admitted under sepsis protocol.  PCP is Benita Stabile. Uses [facility] in [place].Independent in all adls, denies issues accessing medical care, obtaining medications or with transportation.  Current with her PCP.  No discharge needs identified at present by care manager or members of care team at present time.  Discussed need to mobilize patient when medically clear.  He has rule out for C diff.  Other stool cultures pending

## 2015-06-19 NOTE — Progress Notes (Signed)
[place] [facility] [place] NAME: Sean Armstrong    MR#:  [id number]  DATE OF BIRTH:  07-12-1940  SUBJECTIVE:  CHIEF COMPLAINT:   Chief Complaint  Patient presents with  . Code Sepsis   Admitted for diarrhea and sepsis.  Continues to have diarrhea and nausea. No vomiting. Lower abdominal pain. Afebrile.  REVIEW OF SYSTEMS:    Review of Systems  Constitutional: Positive for chills and malaise/fatigue. Negative for fever.  HENT: Negative for sore throat.   Eyes: Negative for blurred vision, double vision and pain.  Respiratory: Negative for cough, hemoptysis, shortness of breath and wheezing.   Cardiovascular: Negative for chest pain, palpitations, orthopnea and leg swelling.  Gastrointestinal: Positive for nausea, abdominal pain and diarrhea. Negative for heartburn, vomiting and constipation.  Genitourinary: Negative for dysuria and hematuria.  Musculoskeletal: Negative for back pain and joint pain.  Skin: Negative for rash.  Neurological: Positive for weakness. Negative for sensory change, speech change, focal weakness and headaches.  Endo/Heme/Allergies: Does not bruise/bleed easily.  Psychiatric/Behavioral: Negative for depression. The patient is not nervous/anxious.     DRUG ALLERGIES:   Allergies  Allergen Reactions  . Gentamycin [Gentamicin] Other (See Comments)    Reaction:  Caused kidney issues for pt   . Propranolol Palpitations    VITALS:  Blood pressure 141/61, pulse 66, temperature 98.8 F (37.1 C), temperature source Oral, resp. rate 18, height 5\' 8"  (1.727 m), weight 86.32 kg (190 lb 4.8 oz), SpO2 98 %.  PHYSICAL EXAMINATION:   Physical Exam  GENERAL:  75 y.o.-year-old patient lying in the bed with no acute distress.  EYES: Pupils equal, round, reactive to light and accommodation. No scleral icterus. Extraocular muscles intact.  HEENT: Head atraumatic, normocephalic. Oropharynx and nasopharynx clear.  NECK:   Supple, no jugular venous distention. No thyroid enlargement, no tenderness.  LUNGS: Normal breath sounds bilaterally, no wheezing, rales, rhonchi. No use of accessory muscles of respiration.  CARDIOVASCULAR: S1, S2 normal. No murmurs, rubs, or gallops.  ABDOMEN: Soft. Tenderness lower abdomen. No rigidity guarding. Bowel sounds normal. EXTREMITIES: No cyanosis, clubbing or edema b/l.    NEUROLOGIC: Cranial nerves II through XII are intact. No focal Motor or sensory deficits b/l.   PSYCHIATRIC: The patient is alert and awake SKIN: No obvious rash, lesion, or ulcer.   LABORATORY PANEL:   CBC  Recent Labs Lab 06/19/15 0426  WBC 9.9  HGB 14.0  HCT 41.1  PLT 143*   ------------------------------------------------------------------------------------------------------------------ Chemistries   Recent Labs Lab 06/18/15 1113 06/19/15 0426  NA 137 139  K 2.7* 2.9*  CL 105 109  CO2 21* 24  GLUCOSE 246* 148*  BUN 38* 32*  CREATININE 1.75* 1.60*  CALCIUM 10.1 8.8*  MG  --  1.7  AST 33  --   ALT 29  --   ALKPHOS 50  --   BILITOT 0.9  --    ------------------------------------------------------------------------------------------------------------------  Cardiac Enzymes  Recent Labs Lab 06/18/15 1113  TROPONINI 0.08*   ------------------------------------------------------------------------------------------------------------------  RADIOLOGY:  Dg Chest 1 View  06/18/2015  CLINICAL DATA:  Fever yesterday, weakness today. EXAM: CHEST 1 VIEW COMPARISON:  11/19/2007 FINDINGS: The heart size and mediastinal contours are within normal limits. Both lungs are clear. The visualized skeletal structures are unremarkable. IMPRESSION: No active disease. Electronically Signed   By: Rolm Baptise M.D.   On: 06/18/2015 12:16   Ct Abdomen Pelvis W Contrast  06/18/2015  CLINICAL DATA:  75 year old hypertensive diabetic male with  fever and weakness. Burning with urination. Initial encounter.  EXAM: CT ABDOMEN AND PELVIS WITH CONTRAST TECHNIQUE: Multidetector CT imaging of the abdomen and pelvis was performed using the standard protocol following bolus administration of intravenous contrast. CONTRAST:  18mL ISOVUE-300 IOPAMIDOL (ISOVUE-300) INJECTION 61% COMPARISON:  12/08/2012 CT. FINDINGS: Lower chest: Tiny nodule left lung base unchanged. No worrisome abnormality. Hepatobiliary: Postcholecystectomy. Minimally lobulated contour of the liver without other findings to suggest cirrhosis. Fatty infiltration of the liver without worrisome mass. Pancreas: Nonspecific small cysts pancreatic tail region without change (measuring up to 1.1 cm). Spleen: Nonspecific peripheral calcifications without worrisome mass. Adrenals/Urinary Tract: Multiple bilateral renal low-density structures larger ones which are cysts. Other ones too small to characterize. Nonobstructing right lower pole renal calculus. Scarring right lower pole may be related to prior obstruction or infection. No adrenal lesion. Stomach/Bowel: Underdistended stomach without gross abnormality noted. No extra luminal bowel inflammatory process, free fluid or free air. Specifically, no inflammation surrounds the appendix. Vascular/Lymphatic: Complex lower abdominal aortic aneurysm with maximal transverse dimension of 4.2 x 3.1 cm versus prior 3.7 x 2.7 cm. Prominent plaque within the abdominal aortic aneurysm. Prominent plaque lower thoracic/ upper abdominal aorta placing the patient at risk for shower of emboli. Prominent coronary artery calcifications. 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 Slightly prominent size portacaval lymph node unchanged. Reproductive: Enlarged prostate gland. Impression upon the bladder base. Clinical and laboratory correlation. Evaluation urinary bladder limited by artifact caused by hip replacement. Other: Fatty containing right inguinal hernia.  Musculoskeletal: Post right hip with streak artifact. Prominent degenerative changes throughout the lower thoracic and lumbar spine with high-grade stenosis L3-4 level. IMPRESSION: No extra luminal bowel inflammatory process, free fluid or free air is noted. Portions of the colon appear fluid filled. Complex lower abdominal aortic aneurysm with maximal transverse dimension of 4.2 x 3.1 cm versus prior 3.7 x 2.7 cm. Prominent plaque within the abdominal aortic aneurysm. Prominent plaque lower thoracic/ upper abdominal aorta placing the patient at risk for shower of emboli. Prominent coronary artery calcifications. Postcholecystectomy. Fatty infiltration liver. Nonspecific small cysts pancreatic tail region without change (measuring up to 1.1 cm). Stability can be confirmed on follow-up in 6-12 months. Multiple bilateral renal low-density structures, larger ones which are cysts. Others too small to characterize. Nonobstructing right lower pole renal calculus. Scarring right lower pole may be related to prior obstruction or infection. 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 Slightly prominent size portacaval lymph node unchanged. Enlarged prostate gland. Impression upon the bladder base. Clinical and laboratory correlation. Prominent degenerative changes throughout the lower thoracic and lumbar spine with high-grade stenosis L3-4 level. Electronically Signed   By: Genia Del M.D.   On: 06/18/2015 14:57     ASSESSMENT AND PLAN:   * Severe diarrhea and vomiting with sepsis C. difficile has returned negative. We'll wait for stool PCR. Discontinue vancomycin IV fluids.  * CKD stage III is stable  * Insulin-dependent diabetes mellitus Start Lantus at lower dose of 15 units. Sliding scale insulin per  * Hypokalemia due to GI losses Replace orally. Also mild hypomagnesemia. Replace through IV.  * Hypertension. Continue atenolol  from home  * DVT prophylaxis with Lovenox  All the records are reviewed and case discussed with Care Management/Social Workerr. Management plans discussed with the patient, family and they are in agreement.  CODE STATUS: FULL CODE  DVT Prophylaxis: SCDs  TOTAL TIME TAKING  CARE OF THIS PATIENT: 30 minutes.   POSSIBLE D/C IN 1-2 DAYS, DEPENDING ON CLINICAL CONDITION.  Hillary Bow R M.D on 06/19/2015 at 11:34 AM  Between 7am to 6pm - Pager - [phone]  After 6pm go to [email or web address] - password [email or web address] [facility]  [facility]  Office  [phone]  CC: Primary care physician; No primary care provider on file.  Note: This dictation was prepared with Dragon dictation along with smaller phrase technology. Any transcriptional errors that result from this process are unintentional.

## 2015-06-20 LAB — BASIC METABOLIC PANEL
ANION GAP: 1 — AB (ref 5–15)
BUN: 23 mg/dL — ABNORMAL HIGH (ref 6–20)
CALCIUM: 8.6 mg/dL — AB (ref 8.9–10.3)
CO2: 23 mmol/L (ref 22–32)
Chloride: 114 mmol/L — ABNORMAL HIGH (ref 101–111)
Creatinine, Ser: 1.15 mg/dL (ref 0.61–1.24)
GFR, EST NON AFRICAN AMERICAN: 60 mL/min — AB (ref >60)
GLUCOSE: 113 mg/dL — AB (ref 65–99)
Potassium: 3.8 mmol/L (ref 3.5–5.1)
SODIUM: 138 mmol/L (ref 135–145)

## 2015-06-20 LAB — GLUCOSE, CAPILLARY
GLUCOSE-CAPILLARY: 114 mg/dL — AB (ref 65–99)
GLUCOSE-CAPILLARY: 127 mg/dL — AB (ref 65–99)
GLUCOSE-CAPILLARY: 188 mg/dL — AB (ref 65–99)

## 2015-06-20 MED ORDER — LOPERAMIDE HCL 2 MG PO CAPS: 2.0000 mg | ORAL_CAPSULE | ORAL | Status: DC | PRN

## 2015-06-20 NOTE — Progress Notes (Signed)
Pt discharged home. DC instructions provided and explained. Medications reviewed. Rx given. All questions answered. Pt stable at discharge.

## 2015-06-20 NOTE — Progress Notes (Signed)
Physical Therapy Evaluation Patient Details Name: Sean Armstrong MRN: [id number] DOB: 23-Jul-1940 Today's Date: 06/20/2015   History of Present Illness  Sean Armstrong is a 75 y.o. male with a known history of attention, diabetes mellitus, rheumatoid arthritis, GERD and by the family because of severe diarrhea, vomiting since the holidays morning around 3 AM. Patient had about 3-5 episodes of vomiting associated with perfuse diarrhea with incontinence. Patient also was confused to according to the brother. c-diff negative; positive for norovirus.  Clinical Impression  Pt able to get out of bed modified independent with HOB flat via log roll using L bed rail.  Pt able to maintain standing using urinal but fatigues quickly and needs seated rest break before ambulation.  Pt able to ambulate 76' with RW and supervision.  Pt would benefit from RW and HHPT after discharge home.  No further PT due to impending discharge today.    Follow Up Recommendations Home health PT    Equipment Recommendations  Rolling walker with 5" wheels    Recommendations for Other Services       Precautions / Restrictions Precautions Precaution Comments: enteric Restrictions Weight Bearing Restrictions: No      Mobility  Bed Mobility Overal bed mobility: Modified Independent             General bed mobility comments: HOB flat, using L bed rail via log roll  Transfers Overall transfer level: Modified independent Equipment used: Rolling walker (2 wheeled)             General transfer comment: Sit<>stand from bed and straight chair using arm rests to rise into standing; good body mechanics and balance.  Ambulation/Gait Ambulation/Gait assistance: Modified independent (Device/Increase time)   Assistive device: Rolling walker (2 wheeled) Gait Pattern/deviations: Step-through pattern     General Gait Details: Limited distance due to fatigue/weakness; gait slow but steady.  Stairs             Wheelchair Mobility    Modified Rankin (Stroke Patients Only)       Balance Overall balance assessment: Modified Independent                                           Pertinent Vitals/Pain Pain Assessment: 0-10 Pain Location: sore on perineium    Home Living Family/patient expects to be discharged to:: Private residence Living Arrangements: Spouse/significant other Available Help at Discharge: Family (son) Type of Home: House Home Access: Ramped entrance     Home Layout: One level Home Equipment: Cane - single point Additional Comments: Wife not well, son available to help some.    Prior Function Level of Independence: Independent with assistive device(s)         Comments: ambulates with cane     Hand Dominance        Extremity/Trunk Assessment   Upper Extremity Assessment: Overall WFL for tasks assessed           Lower Extremity Assessment: Overall WFL for tasks assessed;Generalized weakness         Communication   Communication: No difficulties  Cognition Arousal/Alertness: Awake/alert Behavior During Therapy: WFL for tasks assessed/performed Overall Cognitive Status: Within Functional Limits for tasks assessed                      General Comments      Exercises  Assessment/Plan    PT Assessment All further PT needs can be met in the next venue of care  PT Diagnosis Generalized weakness   PT Problem List Decreased strength;Decreased activity tolerance  PT Treatment Interventions     PT Goals (Current goals can be found in the Care Plan section) Acute Rehab PT Goals Patient Stated Goal: Go home. PT Goal Formulation: With patient Time For Goal Achievement: 06/27/15 Potential to Achieve Goals: Good    Frequency     Barriers to discharge Decreased caregiver support intermittent help at home    Co-evaluation               End of Session Equipment Utilized During Treatment: Gait  belt Activity Tolerance: Patient tolerated treatment well Patient left: in bed Nurse Communication: Mobility status         Time: 1335-1405 PT Time Calculation (min) (ACUTE ONLY): 30 min   Charges:   PT Evaluation $PT Eval Low Complexity: 1 Procedure     PT G Codes:        Sean Armstrong A Sean Armstrong, PT 06/20/2015, 2:48 PM

## 2015-06-20 NOTE — Care Management Note (Signed)
Case Management Note  Patient Details  Name: Sean Armstrong MRN: [id number] Date of Birth: 1941-01-21  Subjective/Objective:                  Met with patient prior to discharge and prior to PT evaluation. He was able to ambulate with rolling walker to nurses station. He is aware that he is discharging to home and agrees to HHPT and rolling walker. He lives with his wife and she is disabled due to COPD. His son recently had shoulder surgery and cannot assist. His wife (he thinks) has used Waucoma. He lives in [place]. He would [facility]. He agrees to CM obtaining rolling walker. Denies difficulty obtaining Rx.   Action/Plan: List of home health care agencies left with patient. Referral made to [facility]. Rolling walker also requested from [facility] to be delivered to patient prior to discharge. No further RNCM needs. Case close.d   Expected Discharge Date:                  Expected Discharge Plan:     In-House Referral:  Clinical Social Work  Discharge planning Services  CM Consult  Post Acute Care Choice:  Home Health Choice offered to:  Patient  DME Arranged:  Walker rolling DME Agency:  [place]:  PT [place] Agency:  [place]  Status of Service:  Completed, signed off  Medicare Important Message Given:    Date Medicare IM Given:    Medicare IM give by:    Date Additional Medicare IM Given:    Additional Medicare Important Message give by:     If discussed at [place] of Stay Meetings, dates discussed:    Additional Comments:  Marshell Garfinkel, RN 06/20/2015, 1:51 PM

## 2015-06-20 NOTE — NC FL2 (Signed)
[place] LEVEL OF CARE SCREENING TOOL     IDENTIFICATION  Patient Name: Sean Armstrong Birthdate: Jul 27, 1940 Sex: male Admission Date (Current Location): 06/18/2015  [place] and [place] Number:  Engineering geologist and Address:  [facility], [address] [place], [place] [address]      Provider Number: [id number]  Attending Physician Name and Address:  Hillary Bow, MD  Relative Name and Phone Number:       Current Level of Care: Hospital Recommended Level of Care: [place] Prior Approval Number:    Date Approved/Denied:   PASRR Number:  ([id number])  Discharge Plan: SNF    Current Diagnoses: Patient Active Problem List   Diagnosis Date Noted  . Sepsis ([place]) 06/18/2015    Orientation RESPIRATION BLADDER Height & Weight     Self, Time, Situation, Place  Normal Continent Weight: 199 lb (90.266 kg) Height:  5\' 8"  (172.7 cm)  BEHAVIORAL SYMPTOMS/MOOD NEUROLOGICAL BOWEL NUTRITION STATUS   (none )  (none ) Incontinent Diet (Diet: Carb Modified )  AMBULATORY STATUS COMMUNICATION OF NEEDS Skin   Extensive Assist Verbally Normal                       Personal Care Assistance Level of Assistance  Bathing, Feeding, Dressing Bathing Assistance: Limited assistance Feeding assistance: Independent Dressing Assistance: Limited assistance     Functional Limitations Info  Sight, Hearing, Speech Sight Info: Adequate Hearing Info: Adequate Speech Info: Adequate    SPECIAL CARE FACTORS FREQUENCY  PT (By licensed PT), OT (By licensed OT)     PT Frequency:  (5) OT Frequency:  (5)            Contractures      Additional Factors Info  Code Status, Allergies, Insulin Sliding Scale, Isolation Precautions Code Status Info:  (Full Code. ) Allergies Info:  (Gentamycin, Propranolol)   Insulin Sliding Scale Info:  (NovoLog Insulin Injections. )       Current Medications (06/20/2015):  This is the current  hospital active medication list Current Facility-Administered Medications  Medication Dose Route Frequency Provider Last Rate Last Dose  . acetaminophen (TYLENOL) tablet 650 mg  650 mg Oral Q6H PRN Epifanio Lesches, MD       Or  . acetaminophen (TYLENOL) suppository 650 mg  650 mg Rectal Q6H PRN Epifanio Lesches, MD      . aspirin EC tablet 81 mg  81 mg Oral Daily Epifanio Lesches, MD   81 mg at 06/20/15 0924  . atenolol (TENORMIN) tablet 25 mg  25 mg Oral Daily Epifanio Lesches, MD   25 mg at 06/20/15 0923  . enoxaparin (LOVENOX) injection 40 mg  40 mg Subcutaneous Q24H Hillary Bow, MD   40 mg at 06/19/15 1535  . fluticasone (FLONASE) 50 MCG/ACT nasal spray 2 spray  2 spray Each Nare Daily Epifanio Lesches, MD   2 spray at 06/19/15 1000  . folic acid (FOLVITE) tablet 1 mg  1 mg Oral Daily Epifanio Lesches, MD   1 mg at 06/20/15 [id number]  . gabapentin (NEURONTIN) capsule 400 mg  400 mg Oral BID Epifanio Lesches, MD   400 mg at 06/20/15 [id number]  . hydroxychloroquine (PLAQUENIL) tablet 200 mg  200 mg Oral Daily Epifanio Lesches, MD   200 mg at 06/20/15 0923  . insulin aspart (novoLOG) injection 0-9 Units  0-9 Units Subcutaneous TID WC Epifanio Lesches, MD   2 Units at 06/19/15 1738  . insulin  detemir (LEVEMIR) injection 15 Units  15 Units Subcutaneous Daily Hillary Bow, MD   15 Units at 06/20/15 [phone]  . loperamide (IMODIUM) capsule 2 mg  2 mg Oral PRN Srikar Sudini, MD      . loratadine (CLARITIN) tablet 10 mg  10 mg Oral Daily Epifanio Lesches, MD   10 mg at 06/20/15 0923  . LORazepam (ATIVAN) tablet 0.5 mg  0.5 mg Oral BID PRN Epifanio Lesches, MD      . methotrexate (RHEUMATREX) tablet 12.5 mg  12.5 mg Oral Q Wed Epifanio Lesches, MD   12.5 mg at 06/20/15 0934  . ondansetron (ZOFRAN) tablet 4 mg  4 mg Oral Q6H PRN Epifanio Lesches, MD       Or  . ondansetron (ZOFRAN) injection 4 mg  4 mg Intravenous Q6H PRN Epifanio Lesches, MD      . pantoprazole  (PROTONIX) EC tablet 40 mg  40 mg Oral Daily Epifanio Lesches, MD   40 mg at 06/20/15 0925  . predniSONE (DELTASONE) tablet 5 mg  5 mg Oral Q breakfast Epifanio Lesches, MD   5 mg at 06/20/15 0923  . simvastatin (ZOCOR) tablet 20 mg  20 mg Oral QHS Epifanio Lesches, MD   20 mg at 06/19/15 2251  . tamsulosin (FLOMAX) capsule 0.4 mg  0.4 mg Oral QPC breakfast Epifanio Lesches, MD   0.4 mg at 06/20/15 [id number]     Discharge Medications: Please see discharge summary for a list of discharge medications.  Relevant Imaging Results:  Relevant Lab Results:   Additional Information  (SSN: [id number])  Loralyn Freshwater, LCSW

## 2015-06-20 NOTE — Progress Notes (Signed)
RN Case Manager consulted CSW for possible SNF placement. PT is recommending home health. RN Case Manager is aware of above. Please reconsult if future social work needs arise. CSW signing off.   Blima Rich, LCSW [phone]

## 2015-06-20 NOTE — Discharge Instructions (Signed)

## 2015-06-21 NOTE — Discharge Summary (Signed)
[place] [facility] [place] NAME: Sean Armstrong    MR#:  [id number]  DATE OF BIRTH:  1940/05/10  DATE OF ADMISSION:  06/18/2015 ADMITTING PHYSICIAN: Epifanio Lesches, MD  DATE OF DISCHARGE: 06/20/2015  4:52 PM  PRIMARY CARE PHYSICIAN: No primary care provider on file.   ADMISSION DIAGNOSIS:  Generalized abdominal pain [R10.84] Sepsis secondary to UTI ([place]) [A41.9, N39.0] Atrial fibrillation, unspecified type ([place]) [I48.91]  DISCHARGE DIAGNOSIS:  Active Problems:   Sepsis ([place])   SECONDARY DIAGNOSIS:   Past Medical History  Diagnosis Date  . Diabetes mellitus without complication ([place])   . Hypertension      ADMITTING HISTORY  Sean Armstrong is a 75 y.o. male with a known history of attention, diabetes mellitus, rheumatoid arthritis, GERD and by the family because of severe diarrhea, vomiting since the holidays morning around 3 AM. Patient had about 3-5 episodes of vomiting associated with perfuse diarrhea with incontinence. Patient also was confused to according to the brother. The temperature was 103 Fahrenheit when the EMS arrived this morning. And in the ER temperature is 101.2 Fahrenheit. Patient has lactic acidosis with a lactic acid of 3.6. As also white count 15.7 with BUN/creatinine 38/1.75, patient right now is alert. He was confused this morning because of fever but after the fever came down and he became more alert. Patienthe is. Denies any stomach pain. And following commands. According to the brother patient was normal yesterday. The patient for severe sepsis with acute renal failure. Patient has diarrhea and the stool smells like C. difficile but unable to obtain the sample because of incontinence.   HOSPITAL COURSE:   * Severe diarrhea and vomiting with sepsis - resolved C. difficile has returned negative. Stool PCR positive for norovirus Oral vancomycin was started due to suspicion for C. difficile on admission which was  discontinued. IV fluids for dehydration  * CKD stage III is stable  * Insulin-dependent diabetes mellitus stable  * Hypokalemia due to GI losses Replaced orally and IV. Resolved  * Hypertension. Continued atenolol from home  * DVT prophylaxis with Lovenox and hospital stay  Patient was seen by physical therapy and home health has been recommended. This has been set up.  Patient was discharged home in a stable condition now that his diarrhea stopped and he is tolerating his food.   CONSULTS OBTAINED:     DRUG ALLERGIES:   Allergies  Allergen Reactions  . Gentamycin [Gentamicin] Other (See Comments)    Reaction:  Caused kidney issues for pt   . Propranolol Palpitations    DISCHARGE MEDICATIONS:   Discharge Medication List as of 06/20/2015  4:42 PM    START taking these medications   Details  loperamide (IMODIUM) 2 MG capsule Take 1 capsule (2 mg total) by mouth every 4 (four) hours as needed for diarrhea or loose stools., Starting 06/20/2015, Until Discontinued, Print      CONTINUE these medications which have NOT CHANGED   Details  aspirin EC 81 MG tablet Take 81 mg by mouth daily., Until Discontinued, Historical Med    atenolol (TENORMIN) 25 MG tablet Take 25 mg by mouth daily., Until Discontinued, Historical Med    cetirizine (ZYRTEC) 10 MG tablet Take 10 mg by mouth daily., Until Discontinued, Historical Med    fluticasone (FLONASE) 50 MCG/ACT nasal spray Place 2 sprays into both nostrils daily., Until Discontinued, Historical Med    folic acid (FOLVITE) 1 MG tablet Take 1 mg by mouth  daily., Until Discontinued, Historical Med    gabapentin (NEURONTIN) 400 MG capsule Take 400 mg by mouth 2 (two) times daily. , Until Discontinued, Historical Med    glimepiride (AMARYL) 2 MG tablet Take 2 mg by mouth 2 (two) times daily. , Until Discontinued, Historical Med    hydroxychloroquine (PLAQUENIL) 200 MG tablet Take 200 mg by mouth daily., Until Discontinued, Historical  Med    Insulin Detemir (LEVEMIR FLEXTOUCH) 100 UNIT/ML Pen Inject 48 Units into the skin at bedtime., Until Discontinued, Historical Med    LORazepam (ATIVAN) 0.5 MG tablet Take 0.5 mg by mouth 2 (two) times daily as needed for anxiety. , Until Discontinued, Historical Med    methotrexate (RHEUMATREX) 2.5 MG tablet Take 12.5 mg by mouth once a week. Pt takes on Wednesday.  Caution:Chemotherapy. Protect from light., Until Discontinued, Historical Med    omeprazole (PRILOSEC) 20 MG capsule Take 40 mg by mouth daily., Until Discontinued, Historical Med    potassium chloride SA (K-DUR,KLOR-CON) 20 MEQ tablet Take 40 mEq by mouth daily. , Until Discontinued, Historical Med    predniSONE (DELTASONE) 5 MG tablet Take 5 mg by mouth daily with breakfast., Until Discontinued, Historical Med    simvastatin (ZOCOR) 20 MG tablet Take 20 mg by mouth at bedtime., Until Discontinued, Historical Med    tamsulosin (FLOMAX) 0.4 MG CAPS capsule Take 0.4 mg by mouth daily after breakfast. , Until Discontinued, Historical Med    triamterene-hydrochlorothiazide (MAXZIDE) 75-50 MG tablet Take 1 tablet by mouth daily., Until Discontinued, Historical Med        Today   VITAL SIGNS:  Blood pressure 128/78, pulse 59, temperature 98.3 F (36.8 C), temperature source Oral, resp. rate 18, height 5\' 8"  (1.727 m), weight 90.266 kg (199 lb), SpO2 98 %.  I/O:   Intake/Output Summary (Last 24 hours) at 06/21/15 1352 Last data filed at 06/20/15 1600  Gross per 24 hour  Intake      0 ml  Output    300 ml  Net   -300 ml    PHYSICAL EXAMINATION:  Physical Exam  GENERAL:  75 y.o.-year-old patient lying in the bed with no acute distress.  LUNGS: Normal breath sounds bilaterally, no wheezing, rales,rhonchi or crepitation. No use of accessory muscles of respiration.  CARDIOVASCULAR: S1, S2 normal. No murmurs, rubs, or gallops.  ABDOMEN: Soft, non-tender, non-distended. Bowel sounds present. No organomegaly or  mass.  NEUROLOGIC: Moves all 4 extremities. PSYCHIATRIC: The patient is alert and oriented x 3.  SKIN: No obvious rash, lesion, or ulcer.   DATA REVIEW:   CBC  Recent Labs Lab 06/19/15 0426  WBC 9.9  HGB 14.0  HCT 41.1  PLT 143*    Chemistries   Recent Labs Lab 06/18/15 1113 06/19/15 0426  06/20/15 0540  NA 137 139  --  138  K 2.7* 2.9*  < > 3.8  CL 105 109  --  114*  CO2 21* 24  --  23  GLUCOSE 246* 148*  --  113*  BUN 38* 32*  --  23*  CREATININE 1.75* 1.60*  --  1.15  CALCIUM 10.1 8.8*  --  8.6*  MG  --  1.7  --   --   AST 33  --   --   --   ALT 29  --   --   --   ALKPHOS 50  --   --   --   BILITOT 0.9  --   --   --   < > =  values in this interval not displayed.  Cardiac Enzymes  Recent Labs Lab 06/18/15 1113  TROPONINI 0.08*    Microbiology Results  Results for orders placed or performed during the hospital encounter of 06/18/15  Culture, blood (routine x 2)     Status: None (Preliminary result)   Collection Time: 06/18/15 11:14 AM  Result Value Ref Range Status   Specimen Description BLOOD riv  Final   Special Requests BOTTLES DRAWN AEROBIC AND ANAEROBIC 2ML  Final   Culture NO GROWTH 3 DAYS  Final   Report Status PENDING  Incomplete  Culture, blood (routine x 2)     Status: None (Preliminary result)   Collection Time: 06/18/15 11:22 AM  Result Value Ref Range Status   Specimen Description BLOOD 2ND SET  Final   Special Requests BOTTLES DRAWN AEROBIC AND ANAEROBIC 1ML  Final   Culture NO GROWTH 3 DAYS  Final   Report Status PENDING  Incomplete  Urine culture     Status: None (Preliminary result)   Collection Time: 06/18/15  2:31 PM  Result Value Ref Range Status   Specimen Description URINE, RANDOM  Final   Special Requests NONE  Final   Culture HOLDING FOR POSSIBLE PATHOGEN  Final   Report Status PENDING  Incomplete  Rapid Influenza A&B Antigens ([place] only)     Status: None   Collection Time: 06/18/15  3:20 PM  Result Value Ref Range  Status   Influenza A ([place]) NEGATIVE NEGATIVE Final   Influenza B ([facility]) NEGATIVE NEGATIVE Final  Gastrointestinal Panel by PCR , Stool     Status: Abnormal   Collection Time: 06/19/15  9:25 AM  Result Value Ref Range Status   Campylobacter species NOT DETECTED NOT DETECTED Final   Plesimonas shigelloides NOT DETECTED NOT DETECTED Final   Salmonella species NOT DETECTED NOT DETECTED Final   Yersinia enterocolitica NOT DETECTED NOT DETECTED Final   Vibrio species NOT DETECTED NOT DETECTED Final   Vibrio cholerae NOT DETECTED NOT DETECTED Final   Enteroaggregative E coli (EAEC) NOT DETECTED NOT DETECTED Final   Enteropathogenic E coli (EPEC) NOT DETECTED NOT DETECTED Final   Enterotoxigenic E coli (ETEC) NOT DETECTED NOT DETECTED Final   Shiga like toxin producing E coli (STEC) NOT DETECTED NOT DETECTED Final   E. coli O157 NOT DETECTED NOT DETECTED Final   Shigella/Enteroinvasive E coli (EIEC) NOT DETECTED NOT DETECTED Final   Cryptosporidium NOT DETECTED NOT DETECTED Final   Cyclospora cayetanensis NOT DETECTED NOT DETECTED Final   Entamoeba histolytica NOT DETECTED NOT DETECTED Final   Giardia lamblia NOT DETECTED NOT DETECTED Final   Adenovirus F40/41 NOT DETECTED NOT DETECTED Final   Astrovirus NOT DETECTED NOT DETECTED Final   Norovirus GI/GII DETECTED (A) NOT DETECTED Final    Comment: CRITICAL RESULT CALLED TO, READ BACK BY AND VERIFIED WITH: TAMMY TODD AT 1320 ON 06/19/15. CTJ    Rotavirus A NOT DETECTED NOT DETECTED Final   Sapovirus (I, II, IV, and V) NOT DETECTED NOT DETECTED Final  C difficile quick scan w PCR reflex     Status: None   Collection Time: 06/19/15  9:25 AM  Result Value Ref Range Status   C Diff antigen NEGATIVE NEGATIVE Final   C Diff toxin NEGATIVE NEGATIVE Final   C Diff interpretation Negative for C. difficile  Final    RADIOLOGY:  No results found.  Follow up with PCP in 1 week.  Management plans discussed with the patient, family and they are in  agreement.  CODE STATUS:  Code Status History    Date Active Date Inactive Code Status Order ID Comments User Context   06/18/2015  5:01 PM 06/20/2015  7:52 PM Full Code [id number]  Epifanio Lesches, MD ED      TOTAL TIME TAKING CARE OF THIS PATIENT ON DAY OF DISCHARGE: more than 30 minutes.   Hillary Bow R M.D on 06/21/2015 at 1:52 PM  Between 7am to 6pm - Pager - [phone]  After 6pm go to [email or web address] - password [email or web address] [facility]  [facility]  Office  [phone]  CC: Primary care physician; No primary care provider on file.  Note: This dictation was prepared with Dragon dictation along with smaller phrase technology. Any transcriptional errors that result from this process are unintentional.

## 2015-06-23 LAB — URINE CULTURE: Culture: 50000 — AB

## 2015-06-23 LAB — CULTURE, BLOOD (ROUTINE X 2)
CULTURE: NO GROWTH
Culture: NO GROWTH

## 2015-06-25 ENCOUNTER — Inpatient Hospital Stay: Payer: Medicare Other | Admitting: Internal Medicine

## 2015-06-25 ENCOUNTER — Inpatient Hospital Stay: Payer: Medicare Other

## 2015-07-02 ENCOUNTER — Ambulatory Visit
Admission: RE | Admit: 2015-07-02 | Discharge: 2015-07-02 | Disposition: A | Discharge status: Home / Self Care | Payer: Medicare Other | Source: Ambulatory Visit | Attending: Unknown Physician Specialty | Admitting: Unknown Physician Specialty

## 2015-07-02 ENCOUNTER — Ambulatory Visit: Payer: Medicare Other | Admitting: Anesthesiology

## 2015-07-02 ENCOUNTER — Encounter
Admission: RE | Disposition: A | Discharge status: Home / Self Care | Payer: Self-pay | Source: Ambulatory Visit | Attending: Unknown Physician Specialty

## 2015-07-02 DIAGNOSIS — Z7951 Long term (current) use of inhaled steroids: Secondary | ICD-10-CM | POA: Insufficient documentation

## 2015-07-02 DIAGNOSIS — Z794 Long term (current) use of insulin: Secondary | ICD-10-CM | POA: Diagnosis not present

## 2015-07-02 DIAGNOSIS — K219 Gastro-esophageal reflux disease without esophagitis: Secondary | ICD-10-CM | POA: Diagnosis not present

## 2015-07-02 DIAGNOSIS — E039 Hypothyroidism, unspecified: Secondary | ICD-10-CM | POA: Diagnosis not present

## 2015-07-02 DIAGNOSIS — E119 Type 2 diabetes mellitus without complications: Secondary | ICD-10-CM | POA: Diagnosis not present

## 2015-07-02 DIAGNOSIS — I252 Old myocardial infarction: Secondary | ICD-10-CM | POA: Diagnosis not present

## 2015-07-02 DIAGNOSIS — K2970 Gastritis, unspecified, without bleeding: Secondary | ICD-10-CM | POA: Diagnosis not present

## 2015-07-02 DIAGNOSIS — Z859 Personal history of malignant neoplasm, unspecified: Secondary | ICD-10-CM | POA: Diagnosis not present

## 2015-07-02 DIAGNOSIS — R1013 Epigastric pain: Secondary | ICD-10-CM | POA: Diagnosis present

## 2015-07-02 DIAGNOSIS — Z79899 Other long term (current) drug therapy: Secondary | ICD-10-CM | POA: Diagnosis not present

## 2015-07-02 DIAGNOSIS — R110 Nausea: Secondary | ICD-10-CM | POA: Insufficient documentation

## 2015-07-02 DIAGNOSIS — Z7982 Long term (current) use of aspirin: Secondary | ICD-10-CM | POA: Insufficient documentation

## 2015-07-02 DIAGNOSIS — Z96653 Presence of artificial knee joint, bilateral: Secondary | ICD-10-CM | POA: Insufficient documentation

## 2015-07-02 DIAGNOSIS — Z87891 Personal history of nicotine dependence: Secondary | ICD-10-CM | POA: Insufficient documentation

## 2015-07-02 DIAGNOSIS — Z96643 Presence of artificial hip joint, bilateral: Secondary | ICD-10-CM | POA: Insufficient documentation

## 2015-07-02 DIAGNOSIS — I10 Essential (primary) hypertension: Secondary | ICD-10-CM | POA: Diagnosis not present

## 2015-07-02 DIAGNOSIS — R1084 Generalized abdominal pain: Secondary | ICD-10-CM | POA: Diagnosis not present

## 2015-07-02 DIAGNOSIS — Z888 Allergy status to other drugs, medicaments and biological substances status: Secondary | ICD-10-CM | POA: Insufficient documentation

## 2015-07-02 HISTORY — PX: ESOPHAGOGASTRODUODENOSCOPY (EGD) WITH PROPOFOL: SHX5813

## 2015-07-02 HISTORY — DX: Hypothyroidism, unspecified: E03.9

## 2015-07-02 HISTORY — DX: Acute myocardial infarction, unspecified: I21.9

## 2015-07-02 LAB — GLUCOSE, CAPILLARY
GLUCOSE-CAPILLARY: 58 mg/dL — AB (ref 65–99)
GLUCOSE-CAPILLARY: 121 mg/dL — AB (ref 65–99)
Glucose-Capillary: 155 mg/dL — ABNORMAL HIGH (ref 65–99)

## 2015-07-02 SURGERY — ESOPHAGOGASTRODUODENOSCOPY (EGD) WITH PROPOFOL
Anesthesia: General

## 2015-07-02 MED ORDER — SODIUM CHLORIDE 0.9 % IV SOLN: INTRAVENOUS | Status: DC

## 2015-07-02 MED ORDER — PROPOFOL 10 MG/ML IV BOLUS
INTRAVENOUS | Status: DC | PRN
  Administered 2015-07-02: 50 mg via INTRAVENOUS

## 2015-07-02 MED ORDER — PROPOFOL 500 MG/50ML IV EMUL
INTRAVENOUS | Status: DC | PRN
  Administered 2015-07-02: 75 ug/kg/min via INTRAVENOUS

## 2015-07-02 MED ORDER — SODIUM CHLORIDE 0.9 % IV SOLN
INTRAVENOUS | Status: DC
  Administered 2015-07-02: 11:00:00 via INTRAVENOUS

## 2015-07-02 MED ORDER — DEXTROSE-NACL 5-0.45 % IV SOLN
INTRAVENOUS | Status: DC
  Administered 2015-07-02: 1000 mL via INTRAVENOUS
  Administered 2015-07-02: 11:00:00 via INTRAVENOUS

## 2015-07-02 NOTE — Transfer of Care (Signed)
Immediate Anesthesia Transfer of Care Note  Patient: Sean Armstrong  Procedure(s) Performed: Procedure(s): ESOPHAGOGASTRODUODENOSCOPY (EGD) WITH PROPOFOL (N/A)  Patient Location: PACU and Endoscopy Unit  Anesthesia Type:General  Level of Consciousness: awake, alert  and oriented  Airway & Oxygen Therapy: Patient Spontanous Breathing and Patient connected to nasal cannula oxygen  Post-op Assessment: Report given to RN and Post -op Vital signs reviewed and stable  Post vital signs: Reviewed and stable  Last Vitals:  Filed Vitals:   07/02/15 1037  BP: 120/79  Pulse: 69  Temp: 36.2 C  Resp: 17    Complications: No apparent anesthesia complications

## 2015-07-02 NOTE — Op Note (Signed)
[facility] Gastroenterology Patient Name: Sean Armstrong Procedure Date: 07/02/2015 11:20 AM MRN: [id number] Account #: [id number] Date of Birth: 1940-05-26 Admit Type: Outpatient Age: 75 Room: [facility] ENDO ROOM 1 Gender: Male Note Status: Finalized Procedure:            Upper GI endoscopy Indications:          Epigastric abdominal pain, Generalized abdominal pain Providers:            Manya Silvas, MD Referring MD:         Leona Carry. Hall Busing, MD (Referring MD) Medicines:            Propofol per Anesthesia Complications:        No immediate complications. Procedure:            Pre-Anesthesia Assessment:                       - After reviewing the risks and benefits, the patient                        was deemed in satisfactory condition to undergo the                        procedure.                       After obtaining informed consent, the endoscope was                        passed under direct vision. Throughout the procedure,                        the patient's blood pressure, pulse, and oxygen                        saturations were monitored continuously. The Endoscope                        was introduced through the mouth, and advanced to the                        second part of duodenum. The upper GI endoscopy was                        accomplished without difficulty. The patient tolerated                        the procedure well. Findings:      The examined esophagus was normal.      Diffuse and patchy mild inflammation characterized by erythema and       granularity was found in the gastric body and in the gastric antrum.       Biopsies were taken with a cold forceps for histology. Biopsies were       taken with a cold forceps for Helicobacter pylori testing.      The examined duodenum was normal. Impression:           - Normal esophagus.                       - Gastritis. Biopsied.                       -  Normal examined  duodenum. Recommendation:       - Await pathology results. Take medicine. Manya Silvas, MD 07/02/2015 11:39:16 AM This report has been signed electronically. Number of Addenda: 0 Note Initiated On: 07/02/2015 11:20 AM      [facility]

## 2015-07-02 NOTE — Anesthesia Preprocedure Evaluation (Signed)
Anesthesia Evaluation  Patient identified by MRN, date of birth, ID band Patient awake    Reviewed: Allergy & Precautions, H&P , NPO status , Patient's Chart, lab work & pertinent test results, reviewed documented beta blocker date and time   Airway Mallampati: III   Neck ROM: full    Dental  (+) Upper Dentures, Lower Dentures   Pulmonary neg pulmonary ROS, former smoker,    Pulmonary exam normal        Cardiovascular hypertension, + Past MI  negative cardio ROS Normal cardiovascular exam Rate:Normal     Neuro/Psych negative neurological ROS  negative psych ROS   GI/Hepatic negative GI ROS, Neg liver ROS,   Endo/Other  negative endocrine ROSdiabetesHypothyroidism   Renal/GU negative Renal ROS  negative genitourinary   Musculoskeletal   Abdominal   Peds  Hematology negative hematology ROS (+)   Anesthesia Other Findings Past Medical History:   Diabetes mellitus without complication ([place])                 Hypertension                                                 Myocardial infarction ([place])                                  Hypothyroidism                                               Cancer ([place])                                               Past Surgical History:   CORONARY ARTERY BYPASS GRAFT                                  CARDIAC CATHETERIZATION                                       CHOLECYSTECTOMY                                               REPLACEMENT TOTAL KNEE BILATERAL                              TOTAL HIP ARTHROPLASTY                          Right              EYE SURGERY  JOINT REPLACEMENT                                           BMI    Body Mass Index   28.39 kg/m 2     Reproductive/Obstetrics                             Anesthesia Physical Anesthesia Plan  ASA: IV  Anesthesia Plan: General   Post-op Pain  Management:    Induction:   Airway Management Planned:   Additional Equipment:   Intra-op Plan:   Post-operative Plan:   Informed Consent: I have reviewed the patients History and Physical, chart, labs and discussed the procedure including the risks, benefits and alternatives for the proposed anesthesia with the patient or authorized representative who has indicated his/her understanding and acceptance.   Dental Advisory Given  Plan Discussed with: CRNA  Anesthesia Plan Comments:         Anesthesia Quick Evaluation

## 2015-07-02 NOTE — H&P (Signed)
Primary Care Physician:  Albina Billet, MD Primary Gastroenterologist:  Dr. Vira Agar  Pre-Procedure History & Physical: HPI:  Sean Armstrong is a 75 y.o. male is here for an endoscopy.   Past Medical History  Diagnosis Date  . Diabetes mellitus without complication ([place])   . Hypertension   . Myocardial infarction ([place])   . Hypothyroidism   . Cancer [facility])     Past Surgical History  Procedure Laterality Date  . Coronary artery bypass graft    . Cardiac catheterization    . Cholecystectomy    . Replacement total knee bilateral    . Total hip arthroplasty Right   . Eye surgery    . Joint replacement      Prior to Admission medications   Medication Sig Start Date End Date Taking? Authorizing Provider  atenolol (TENORMIN) 25 MG tablet Take 25 mg by mouth daily.   Yes Historical Provider, MD  triamterene-hydrochlorothiazide (MAXZIDE) 75-50 MG tablet Take 1 tablet by mouth daily.   Yes Historical Provider, MD  aspirin EC 81 MG tablet Take 81 mg by mouth daily.    Historical Provider, MD  cetirizine (ZYRTEC) 10 MG tablet Take 10 mg by mouth daily.    Historical Provider, MD  fluticasone (FLONASE) 50 MCG/ACT nasal spray Place 2 sprays into both nostrils daily.    Historical Provider, MD  folic acid (FOLVITE) 1 MG tablet Take 1 mg by mouth daily.    Historical Provider, MD  gabapentin (NEURONTIN) 400 MG capsule Take 400 mg by mouth 2 (two) times daily.     Historical Provider, MD  glimepiride (AMARYL) 2 MG tablet Take 2 mg by mouth 2 (two) times daily.     Historical Provider, MD  hydroxychloroquine (PLAQUENIL) 200 MG tablet Take 200 mg by mouth daily.    Historical Provider, MD  Insulin Detemir (LEVEMIR FLEXTOUCH) 100 UNIT/ML Pen Inject 48 Units into the skin at bedtime.    Historical Provider, MD  loperamide (IMODIUM) 2 MG capsule Take 1 capsule (2 mg total) by mouth every 4 (four) hours as needed for diarrhea or loose stools. 06/20/15   Srikar Sudini, MD  LORazepam (ATIVAN) 0.5 MG  tablet Take 0.5 mg by mouth 2 (two) times daily as needed for anxiety.     Historical Provider, MD  methotrexate (RHEUMATREX) 2.5 MG tablet Take 12.5 mg by mouth once a week. Pt takes on Wednesday.  Caution:Chemotherapy. Protect from light.    Historical Provider, MD  omeprazole (PRILOSEC) 20 MG capsule Take 40 mg by mouth daily.    Historical Provider, MD  potassium chloride SA (K-DUR,KLOR-CON) 20 MEQ tablet Take 40 mEq by mouth daily.     Historical Provider, MD  predniSONE (DELTASONE) 5 MG tablet Take 5 mg by mouth daily with breakfast.    Historical Provider, MD  simvastatin (ZOCOR) 20 MG tablet Take 20 mg by mouth at bedtime.    Historical Provider, MD  tamsulosin (FLOMAX) 0.4 MG CAPS capsule Take 0.4 mg by mouth daily after breakfast.     Historical Provider, MD    Allergies as of 06/25/2015 - Review Complete 06/18/2015  Allergen Reaction Noted  . Gentamycin [gentamicin] Other (See Comments) 06/18/2015  . Propranolol Palpitations 06/18/2015    History reviewed. No pertinent family history.  Social History   Social History  . Marital Status: Married    Spouse Name: N/A  . Number of Children: N/A  . Years of Education: N/A   Occupational History  . Not on file.  Social History Main Topics  . Smoking status: Former Research scientist (life sciences)  . Smokeless tobacco: Current User    Types: Chew  . Alcohol Use: No  . Drug Use: No  . Sexual Activity: Not on file   Other Topics Concern  . Not on file   Social History Narrative    Review of Systems: See HPI, otherwise negative ROS  Physical Exam: BP 144/82 mmHg  Pulse 59  Temp(Src) 96.9 F (36.1 C) (Tympanic)  Resp 14  Ht 5' 9.5" (1.765 m)  Wt 88.451 kg (195 lb)  BMI 28.39 kg/m2  SpO2 100% General:   Alert,  pleasant and cooperative in NAD Head:  Normocephalic and atraumatic. Neck:  Supple; no masses or thyromegaly. Lungs:  Clear throughout to auscultation.    Heart:  Regular rate and rhythm. Abdomen:  Soft, nontender and  nondistended. Normal bowel sounds, without guarding, and without rebound.   Neurologic:  Alert and  oriented x4;  grossly normal neurologically.  Impression/Plan: Sean Armstrong is here for an endoscopy to be performed for abdominal pain after eating  Risks, benefits, limitations, and alternatives regarding  endoscopy have been reviewed with the patient.  Questions have been answered.  All parties agreeable.   Gaylyn Cheers, MD  07/02/2015, 2:48 PM

## 2015-07-03 LAB — SURGICAL PATHOLOGY

## 2015-07-03 NOTE — Anesthesia Postprocedure Evaluation (Signed)
Anesthesia Post Note  Patient: Sean Armstrong  Procedure(s) Performed: Procedure(s) (LRB): ESOPHAGOGASTRODUODENOSCOPY (EGD) WITH PROPOFOL (N/A)  Patient location during evaluation: PACU Anesthesia Type: General Level of consciousness: awake and alert Pain management: pain level controlled Vital Signs Assessment: post-procedure vital signs reviewed and stable Respiratory status: spontaneous breathing, nonlabored ventilation, respiratory function stable and patient connected to nasal cannula oxygen Cardiovascular status: blood pressure returned to baseline and stable Postop Assessment: no signs of nausea or vomiting Anesthetic complications: no    Last Vitals:  Filed Vitals:   07/02/15 1212 07/02/15 1222  BP: 144/82 144/82  Pulse: 61 59  Temp:    Resp: 15 14    Last Pain:  Filed Vitals:   07/03/15 0758  PainSc: 0-No pain                 Molli Barrows

## 2015-07-06 ENCOUNTER — Inpatient Hospital Stay: Payer: Medicare Other | Admitting: Internal Medicine

## 2015-07-06 ENCOUNTER — Inpatient Hospital Stay: Payer: Medicare Other

## 2015-08-02 ENCOUNTER — Encounter: Payer: Self-pay | Admitting: *Deleted

## 2015-08-03 ENCOUNTER — Inpatient Hospital Stay: Payer: Medicare Other | Attending: Internal Medicine

## 2015-08-03 ENCOUNTER — Ambulatory Visit: Payer: Medicare Other | Admitting: Internal Medicine

## 2015-08-03 ENCOUNTER — Inpatient Hospital Stay (HOSPITAL_BASED_OUTPATIENT_CLINIC_OR_DEPARTMENT_OTHER): Payer: Medicare Other | Admitting: Internal Medicine

## 2015-08-03 ENCOUNTER — Other Ambulatory Visit: Payer: Self-pay | Admitting: *Deleted

## 2015-08-03 VITALS — BP 155/72 | HR 62 | Temp 97.2°F | Resp 18 | Wt 195.5 lb

## 2015-08-03 DIAGNOSIS — I471 Supraventricular tachycardia: Secondary | ICD-10-CM | POA: Insufficient documentation

## 2015-08-03 DIAGNOSIS — M7989 Other specified soft tissue disorders: Secondary | ICD-10-CM | POA: Insufficient documentation

## 2015-08-03 DIAGNOSIS — E039 Hypothyroidism, unspecified: Secondary | ICD-10-CM | POA: Diagnosis not present

## 2015-08-03 DIAGNOSIS — K219 Gastro-esophageal reflux disease without esophagitis: Secondary | ICD-10-CM | POA: Insufficient documentation

## 2015-08-03 DIAGNOSIS — M069 Rheumatoid arthritis, unspecified: Secondary | ICD-10-CM

## 2015-08-03 DIAGNOSIS — I252 Old myocardial infarction: Secondary | ICD-10-CM | POA: Insufficient documentation

## 2015-08-03 DIAGNOSIS — E119 Type 2 diabetes mellitus without complications: Secondary | ICD-10-CM

## 2015-08-03 DIAGNOSIS — R5383 Other fatigue: Secondary | ICD-10-CM | POA: Diagnosis not present

## 2015-08-03 DIAGNOSIS — M5136 Other intervertebral disc degeneration, lumbar region: Secondary | ICD-10-CM

## 2015-08-03 DIAGNOSIS — I129 Hypertensive chronic kidney disease with stage 1 through stage 4 chronic kidney disease, or unspecified chronic kidney disease: Secondary | ICD-10-CM | POA: Diagnosis not present

## 2015-08-03 DIAGNOSIS — Z87442 Personal history of urinary calculi: Secondary | ICD-10-CM | POA: Diagnosis not present

## 2015-08-03 DIAGNOSIS — Z86718 Personal history of other venous thrombosis and embolism: Secondary | ICD-10-CM

## 2015-08-03 DIAGNOSIS — Z86711 Personal history of pulmonary embolism: Secondary | ICD-10-CM

## 2015-08-03 DIAGNOSIS — D472 Monoclonal gammopathy: Secondary | ICD-10-CM

## 2015-08-03 DIAGNOSIS — Z7982 Long term (current) use of aspirin: Secondary | ICD-10-CM

## 2015-08-03 DIAGNOSIS — Z86010 Personal history of colonic polyps: Secondary | ICD-10-CM | POA: Insufficient documentation

## 2015-08-03 DIAGNOSIS — N189 Chronic kidney disease, unspecified: Secondary | ICD-10-CM | POA: Insufficient documentation

## 2015-08-03 DIAGNOSIS — I2510 Atherosclerotic heart disease of native coronary artery without angina pectoris: Secondary | ICD-10-CM | POA: Insufficient documentation

## 2015-08-03 DIAGNOSIS — E785 Hyperlipidemia, unspecified: Secondary | ICD-10-CM | POA: Insufficient documentation

## 2015-08-03 DIAGNOSIS — Z79899 Other long term (current) drug therapy: Secondary | ICD-10-CM | POA: Insufficient documentation

## 2015-08-03 DIAGNOSIS — Z794 Long term (current) use of insulin: Secondary | ICD-10-CM | POA: Insufficient documentation

## 2015-08-03 DIAGNOSIS — Z8711 Personal history of peptic ulcer disease: Secondary | ICD-10-CM | POA: Insufficient documentation

## 2015-08-03 DIAGNOSIS — M1990 Unspecified osteoarthritis, unspecified site: Secondary | ICD-10-CM

## 2015-08-03 DIAGNOSIS — Z87891 Personal history of nicotine dependence: Secondary | ICD-10-CM | POA: Diagnosis not present

## 2015-08-03 LAB — CBC WITH DIFFERENTIAL/PLATELET
BASOS ABS: 0.1 10*3/uL (ref 0–0.1)
Basophils Relative: 1 %
EOS ABS: 0.3 10*3/uL (ref 0–0.7)
EOS PCT: 3 %
HCT: 38.2 % — ABNORMAL LOW (ref 40.0–52.0)
HEMOGLOBIN: 12.9 g/dL — AB (ref 13.0–18.0)
LYMPHS PCT: 15 %
Lymphs Abs: 1.2 10*3/uL (ref 1.0–3.6)
MCH: 29.8 pg (ref 26.0–34.0)
MCHC: 33.7 g/dL (ref 32.0–36.0)
MCV: 88.5 fL (ref 80.0–100.0)
Monocytes Absolute: 0.5 10*3/uL (ref 0.2–1.0)
Monocytes Relative: 6 %
NEUTROS PCT: 75 %
Neutro Abs: 6.1 10*3/uL (ref 1.4–6.5)
PLATELETS: 187 10*3/uL (ref 150–440)
RBC: 4.32 MIL/uL — AB (ref 4.40–5.90)
RDW: 16.3 % — ABNORMAL HIGH (ref 11.5–14.5)
WBC: 8.1 10*3/uL (ref 3.8–10.6)

## 2015-08-03 LAB — COMPREHENSIVE METABOLIC PANEL
ALK PHOS: 49 U/L (ref 38–126)
ALT: 21 U/L (ref 17–63)
AST: 26 U/L (ref 15–41)
Albumin: 3.9 g/dL (ref 3.5–5.0)
Anion gap: 6 (ref 5–15)
BUN: 21 mg/dL — AB (ref 6–20)
CHLORIDE: 103 mmol/L (ref 101–111)
CO2: 26 mmol/L (ref 22–32)
CREATININE: 1.35 mg/dL — AB (ref 0.61–1.24)
Calcium: 10.3 mg/dL (ref 8.9–10.3)
GFR calc Af Amer: 57 mL/min — ABNORMAL LOW (ref >60)
GFR calc non Af Amer: 49 mL/min — ABNORMAL LOW (ref >60)
GLUCOSE: 229 mg/dL — AB (ref 65–99)
Potassium: 3.9 mmol/L (ref 3.5–5.1)
SODIUM: 135 mmol/L (ref 135–145)
Total Bilirubin: 0.8 mg/dL (ref 0.3–1.2)
Total Protein: 7.2 g/dL (ref 6.5–8.1)

## 2015-08-03 NOTE — Progress Notes (Signed)
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 I reviewed the patient's prior charts/pertinent labs/imaging in detail; findings are summarized above.   75 year old male patient with a history of MGUS is here for follow-up.  Patient in the interim was admitted to the hospital for severe diarrhea- diagnosed with norvo virus infection/also had sepsis renal failure. Patient has been discharged home.   Diarrhea resolved. However continues to complain of fatigue. Denies any unusual bone pain. Denies any unusual swelling in the legs. Chronic mild swelling in the legs. No nausea no vomiting. No tingling and numbness. No unusual chest pain or shortness of breath or cough.   REVIEW OF SYSTEMS:  A complete 10 point review of system is done which is negative except mentioned above/history of present illness.   PAST MEDICAL HISTORY :  Past Medical History  Diagnosis Date  . Diabetes mellitus without complication ([place])   . Hypertension   . Myocardial infarction ([place])   . Hypothyroidism   . MGUS (monoclonal gammopathy of unknown significance)   . Anemia   . Rheumatoid arthritis ([place])   . Osteoarthritis   . FHx: SVT (supraventricular tachycardia) 1998  . Coronary artery disease   . Hyperlipidemia   . Nephrolithiasis   . Peptic ulcer disease   . Lumbar disc disease   . History of DVT (deep vein thrombosis) 2009  . History of pulmonary embolism 08/2008  . Renal insufficiency   . Colon polyp   . GERD (gastroesophageal reflux disease)     PAST SURGICAL HISTORY :   Past Surgical History  Procedure Laterality Date  . Coronary artery bypass graft    . Cardiac  catheterization    . Cholecystectomy    . Replacement total knee bilateral    . Total hip arthroplasty Right   . Eye surgery    . Joint replacement    . Esophagogastroduodenoscopy (egd) with propofol N/A 07/02/2015    Procedure: ESOPHAGOGASTRODUODENOSCOPY (EGD) WITH PROPOFOL;  Surgeon: Manya Silvas, MD;  Location: [facility];  Service: Endoscopy;  Laterality: N/A;  . Colonoscopy  03/2007, 2006    FAMILY HISTORY :  No family history on file.  SOCIAL HISTORY:   Social History  Substance Use Topics  . Smoking status: Former Smoker    Types: Cigarettes  . Smokeless tobacco: Current User    Types: Chew     Comment: quit smoking cigerates 1995  . Alcohol Use: No    ALLERGIES:  is allergic to gentamycin; iodinated diagnostic agents; meloxicam; penicillin v potassium; celecoxib; ciprofloxacin; hydrocodone; and propranolol.  MEDICATIONS:  Current Outpatient Prescriptions  Medication Sig Dispense Refill  . aspirin EC 81 MG tablet Take 81 mg by mouth daily.    Marland Kitchen atenolol (TENORMIN) 25 MG tablet Take 25 mg by mouth daily.    . cetirizine (ZYRTEC) 10 MG tablet Take 10 mg by mouth daily.    . fluticasone (FLONASE) 50 MCG/ACT nasal spray Place 2 sprays into both nostrils daily.    . folic acid (FOLVITE) 1 MG tablet Take 1 mg by mouth daily.    Marland Kitchen gabapentin (NEURONTIN) 400 MG capsule Take 400 mg by mouth 2 (two) times daily.     Marland Kitchen  glimepiride (AMARYL) 2 MG tablet Take 2 mg by mouth 2 (two) times daily.     . hydroxychloroquine (PLAQUENIL) 200 MG tablet Take 200 mg by mouth daily.    . Insulin Detemir (LEVEMIR FLEXTOUCH) 100 UNIT/ML Pen Inject 48 Units into the skin at bedtime.    Marland Kitchen loperamide (IMODIUM) 2 MG capsule Take 1 capsule (2 mg total) by mouth every 4 (four) hours as needed for diarrhea or loose stools. 20 capsule 0  . LORazepam (ATIVAN) 0.5 MG tablet Take 0.5 mg by mouth 2 (two) times daily as needed for anxiety.     . methotrexate (RHEUMATREX) 2.5 MG tablet Take 12.5 mg by  mouth once a week. Pt takes on Wednesday.  Caution:Chemotherapy. Protect from light.    . Multiple Vitamins-Minerals (CENTRUM SILVER PO) Take by mouth.    Marland Kitchen omeprazole (PRILOSEC) 20 MG capsule Take 40 mg by mouth daily.    . potassium chloride SA (K-DUR,KLOR-CON) 20 MEQ tablet Take 40 mEq by mouth daily.     . predniSONE (DELTASONE) 5 MG tablet Take 5 mg by mouth daily with breakfast.    . ranitidine (ZANTAC) 150 MG tablet     . simvastatin (ZOCOR) 20 MG tablet Take 20 mg by mouth at bedtime.    . sucralfate (CARAFATE) 1 g tablet Take by mouth.    . tamsulosin (FLOMAX) 0.4 MG CAPS capsule Take 0.4 mg by mouth daily after breakfast.     . traMADol (ULTRAM) 50 MG tablet Take 50 mg by mouth.    . triamterene-hydrochlorothiazide (MAXZIDE) 75-50 MG tablet Take 1 tablet by mouth daily.     No current facility-administered medications for this visit.    PHYSICAL EXAMINATION:   BP 155/72 mmHg  Pulse 62  Temp(Src) 97.2 F (36.2 C) (Tympanic)  Resp 18  Wt 195 lb 8.8 oz (88.7 kg)  Filed Weights   08/03/15 1035  Weight: 195 lb 8.8 oz (88.7 kg)    GENERAL: Well-nourished well-developed; Alert, no distress and comfortable.   Accompanied by his son. EYES: no pallor or icterus OROPHARYNX: no thrush or ulceration; good dentition  NECK: supple, no masses felt LYMPH:  no palpable lymphadenopathy in the cervical, axillary or inguinal regions LUNGS: clear to auscultation and  No wheeze or crackles HEART/CVS: regular rate & rhythm and no murmurs; 1+ bilateral lower extremity edema ABDOMEN:abdomen soft, non-tender and normal bowel sounds Musculoskeletal:no cyanosis of digits and no clubbing  PSYCH: alert & oriented x 3 with fluent speech NEURO: no focal motor/sensory deficits SKIN:  no rashes or significant lesions  LABORATORY DATA:  I have reviewed the data as listed    Component Value Date/Time   NA 135 08/03/2015 0935   K 3.9 08/03/2015 0935   CL 103 08/03/2015 0935   CO2 26 08/03/2015  0935   GLUCOSE 229* 08/03/2015 0935   BUN 21* 08/03/2015 0935   CREATININE 1.35* 08/03/2015 0935   CREATININE 1.59* 06/23/2014 1115   CALCIUM 10.3 08/03/2015 0935   CALCIUM 9.7 06/23/2014 1115   PROT 7.2 08/03/2015 0935   ALBUMIN 3.9 08/03/2015 0935   AST 26 08/03/2015 0935   ALT 21 08/03/2015 0935   ALKPHOS 49 08/03/2015 0935   BILITOT 0.8 08/03/2015 0935   GFRNONAA 49* 08/03/2015 0935   GFRNONAA 42* 06/23/2014 1115   GFRAA 57* 08/03/2015 0935   GFRAA 48* 06/23/2014 1115    No results found for: SPEP, UPEP  Lab Results  Component Value Date   WBC 8.1 08/03/2015  NEUTROABS 6.1 08/03/2015   HGB 12.9* 08/03/2015   HCT 38.2* 08/03/2015   MCV 88.5 08/03/2015   PLT 187 08/03/2015      Chemistry      Component Value Date/Time   NA 135 08/03/2015 0935   K 3.9 08/03/2015 0935   CL 103 08/03/2015 0935   CO2 26 08/03/2015 0935   BUN 21* 08/03/2015 0935   CREATININE 1.35* 08/03/2015 0935   CREATININE 1.59* 06/23/2014 1115      Component Value Date/Time   CALCIUM 10.3 08/03/2015 0935   CALCIUM 9.7 06/23/2014 1115   ALKPHOS 49 08/03/2015 0935   AST 26 08/03/2015 0935   ALT 21 08/03/2015 0935   BILITOT 0.8 08/03/2015 0935        ASSESSMENT & PLAN:   # MGUS- no obvious evidence of any clinical suspicion for multiple myeloma. Hemoglobin slightly low at 12.9. Chronic kidney disease [see below]; await myeloma panel from today.if the myeloma panel steady- no new recommendations at this time.  # CKD- 1.35/ stable.  # severe fatigue- likely secondary to recent sepsis/ renal failure for norvo virus infection. Unlikely  From his mild anemia/ history of MGUS.  # recommend follow-up in one year with labs.      Cammie Sickle, MD 08/03/2015 10:44 AM

## 2015-08-03 NOTE — Progress Notes (Signed)
Former patient of Dr. Ma Hillock.  Seen for anemia. Patient states he had Noro virus a few months ago.  Still feels weak. Otherwise no complaints.

## 2015-08-06 LAB — PROTEIN ELECTROPHORESIS, SERUM
A/G RATIO SPE: 1 (ref 0.7–1.7)
Albumin ELP: 3.3 g/dL (ref 2.9–4.4)
Alpha-1-Globulin: 0.2 g/dL (ref 0.0–0.4)
Alpha-2-Globulin: 0.9 g/dL (ref 0.4–1.0)
BETA GLOBULIN: 1.2 g/dL (ref 0.7–1.3)
GAMMA GLOBULIN: 1 g/dL (ref 0.4–1.8)
GLOBULIN, TOTAL: 3.3 g/dL (ref 2.2–3.9)
M-SPIKE, %: 0.2 g/dL — AB
Total Protein ELP: 6.6 g/dL (ref 6.0–8.5)

## 2015-08-06 LAB — KAPPA/LAMBDA LIGHT CHAINS
KAPPA FREE LGHT CHN: 58.5 mg/L — AB (ref 3.30–19.40)
Kappa, lambda light chain ratio: 1.15 (ref 0.26–1.65)
LAMDA FREE LIGHT CHAINS: 50.76 mg/L — AB (ref 5.71–26.30)

## 2015-08-08 LAB — MULTIPLE MYELOMA PANEL, SERUM
Albumin SerPl Elph-Mcnc: 3.5 g/dL (ref 2.9–4.4)
Albumin/Glob SerPl: 1.2 (ref 0.7–1.7)
Alpha 1: 0.2 g/dL (ref 0.0–0.4)
Alpha2 Glob SerPl Elph-Mcnc: 0.8 g/dL (ref 0.4–1.0)
B-GLOBULIN SERPL ELPH-MCNC: 1.1 g/dL (ref 0.7–1.3)
Gamma Glob SerPl Elph-Mcnc: 0.9 g/dL (ref 0.4–1.8)
Globulin, Total: 3.1 g/dL (ref 2.2–3.9)
IGA: 400 mg/dL (ref 61–437)
IgG (Immunoglobin G), Serum: 1000 mg/dL (ref 700–1600)
IgM, Serum: 158 mg/dL — ABNORMAL HIGH (ref 15–143)
M PROTEIN SERPL ELPH-MCNC: 0.3 g/dL — AB
TOTAL PROTEIN ELP: 6.6 g/dL (ref 6.0–8.5)

## 2015-12-05 ENCOUNTER — Emergency Department: Payer: Medicare Other

## 2015-12-05 ENCOUNTER — Observation Stay
Admission: EM | Admit: 2015-12-05 | Discharge: 2015-12-07 | Disposition: A | Discharge status: Home / Self Care | Payer: Medicare Other | Attending: Internal Medicine | Admitting: Internal Medicine

## 2015-12-05 ENCOUNTER — Encounter: Payer: Self-pay | Admitting: Intensive Care

## 2015-12-05 DIAGNOSIS — I2510 Atherosclerotic heart disease of native coronary artery without angina pectoris: Secondary | ICD-10-CM | POA: Insufficient documentation

## 2015-12-05 DIAGNOSIS — Z96641 Presence of right artificial hip joint: Secondary | ICD-10-CM | POA: Insufficient documentation

## 2015-12-05 DIAGNOSIS — I638 Other cerebral infarction: Principal | ICD-10-CM | POA: Insufficient documentation

## 2015-12-05 DIAGNOSIS — E039 Hypothyroidism, unspecified: Secondary | ICD-10-CM | POA: Insufficient documentation

## 2015-12-05 DIAGNOSIS — I639 Cerebral infarction, unspecified: Secondary | ICD-10-CM

## 2015-12-05 DIAGNOSIS — Z881 Allergy status to other antibiotic agents status: Secondary | ICD-10-CM | POA: Insufficient documentation

## 2015-12-05 DIAGNOSIS — Z86711 Personal history of pulmonary embolism: Secondary | ICD-10-CM | POA: Insufficient documentation

## 2015-12-05 DIAGNOSIS — N3090 Cystitis, unspecified without hematuria: Secondary | ICD-10-CM

## 2015-12-05 DIAGNOSIS — K219 Gastro-esophageal reflux disease without esophagitis: Secondary | ICD-10-CM | POA: Diagnosis present

## 2015-12-05 DIAGNOSIS — Z87891 Personal history of nicotine dependence: Secondary | ICD-10-CM | POA: Insufficient documentation

## 2015-12-05 DIAGNOSIS — N281 Cyst of kidney, acquired: Secondary | ICD-10-CM | POA: Insufficient documentation

## 2015-12-05 DIAGNOSIS — N289 Disorder of kidney and ureter, unspecified: Secondary | ICD-10-CM | POA: Diagnosis not present

## 2015-12-05 DIAGNOSIS — Z880 Allergy status to penicillin: Secondary | ICD-10-CM | POA: Insufficient documentation

## 2015-12-05 DIAGNOSIS — M6281 Muscle weakness (generalized): Secondary | ICD-10-CM

## 2015-12-05 DIAGNOSIS — E119 Type 2 diabetes mellitus without complications: Secondary | ICD-10-CM

## 2015-12-05 DIAGNOSIS — Z87442 Personal history of urinary calculi: Secondary | ICD-10-CM | POA: Diagnosis not present

## 2015-12-05 DIAGNOSIS — R531 Weakness: Secondary | ICD-10-CM

## 2015-12-05 DIAGNOSIS — I714 Abdominal aortic aneurysm, without rupture: Secondary | ICD-10-CM | POA: Insufficient documentation

## 2015-12-05 DIAGNOSIS — Z7982 Long term (current) use of aspirin: Secondary | ICD-10-CM | POA: Insufficient documentation

## 2015-12-05 DIAGNOSIS — Z794 Long term (current) use of insulin: Secondary | ICD-10-CM | POA: Insufficient documentation

## 2015-12-05 DIAGNOSIS — N390 Urinary tract infection, site not specified: Secondary | ICD-10-CM | POA: Diagnosis not present

## 2015-12-05 DIAGNOSIS — M069 Rheumatoid arthritis, unspecified: Secondary | ICD-10-CM | POA: Diagnosis not present

## 2015-12-05 DIAGNOSIS — M5136 Other intervertebral disc degeneration, lumbar region: Secondary | ICD-10-CM | POA: Insufficient documentation

## 2015-12-05 DIAGNOSIS — Z8711 Personal history of peptic ulcer disease: Secondary | ICD-10-CM | POA: Diagnosis not present

## 2015-12-05 DIAGNOSIS — B952 Enterococcus as the cause of diseases classified elsewhere: Secondary | ICD-10-CM | POA: Insufficient documentation

## 2015-12-05 DIAGNOSIS — E1142 Type 2 diabetes mellitus with diabetic polyneuropathy: Secondary | ICD-10-CM | POA: Insufficient documentation

## 2015-12-05 DIAGNOSIS — I252 Old myocardial infarction: Secondary | ICD-10-CM | POA: Insufficient documentation

## 2015-12-05 DIAGNOSIS — I4891 Unspecified atrial fibrillation: Secondary | ICD-10-CM | POA: Diagnosis not present

## 2015-12-05 DIAGNOSIS — I471 Supraventricular tachycardia: Secondary | ICD-10-CM | POA: Insufficient documentation

## 2015-12-05 DIAGNOSIS — G510 Bell's palsy: Secondary | ICD-10-CM | POA: Insufficient documentation

## 2015-12-05 DIAGNOSIS — M5127 Other intervertebral disc displacement, lumbosacral region: Secondary | ICD-10-CM | POA: Insufficient documentation

## 2015-12-05 DIAGNOSIS — Z888 Allergy status to other drugs, medicaments and biological substances status: Secondary | ICD-10-CM | POA: Insufficient documentation

## 2015-12-05 DIAGNOSIS — Z961 Presence of intraocular lens: Secondary | ICD-10-CM | POA: Insufficient documentation

## 2015-12-05 DIAGNOSIS — Z96653 Presence of artificial knee joint, bilateral: Secondary | ICD-10-CM | POA: Insufficient documentation

## 2015-12-05 DIAGNOSIS — E669 Obesity, unspecified: Secondary | ICD-10-CM | POA: Insufficient documentation

## 2015-12-05 DIAGNOSIS — F419 Anxiety disorder, unspecified: Secondary | ICD-10-CM | POA: Insufficient documentation

## 2015-12-05 DIAGNOSIS — Z1639 Resistance to other specified antimicrobial drug: Secondary | ICD-10-CM | POA: Insufficient documentation

## 2015-12-05 DIAGNOSIS — Z79899 Other long term (current) drug therapy: Secondary | ICD-10-CM | POA: Insufficient documentation

## 2015-12-05 DIAGNOSIS — G8194 Hemiplegia, unspecified affecting left nondominant side: Secondary | ICD-10-CM | POA: Insufficient documentation

## 2015-12-05 DIAGNOSIS — E782 Mixed hyperlipidemia: Secondary | ICD-10-CM | POA: Insufficient documentation

## 2015-12-05 DIAGNOSIS — E1165 Type 2 diabetes mellitus with hyperglycemia: Secondary | ICD-10-CM | POA: Diagnosis not present

## 2015-12-05 DIAGNOSIS — R739 Hyperglycemia, unspecified: Secondary | ICD-10-CM | POA: Diagnosis present

## 2015-12-05 DIAGNOSIS — Z885 Allergy status to narcotic agent status: Secondary | ICD-10-CM | POA: Diagnosis not present

## 2015-12-05 DIAGNOSIS — M5126 Other intervertebral disc displacement, lumbar region: Secondary | ICD-10-CM | POA: Insufficient documentation

## 2015-12-05 DIAGNOSIS — R29898 Other symptoms and signs involving the musculoskeletal system: Secondary | ICD-10-CM | POA: Diagnosis present

## 2015-12-05 DIAGNOSIS — I10 Essential (primary) hypertension: Secondary | ICD-10-CM | POA: Diagnosis not present

## 2015-12-05 DIAGNOSIS — N400 Enlarged prostate without lower urinary tract symptoms: Secondary | ICD-10-CM | POA: Insufficient documentation

## 2015-12-05 DIAGNOSIS — Z86010 Personal history of colonic polyps: Secondary | ICD-10-CM | POA: Insufficient documentation

## 2015-12-05 DIAGNOSIS — Z951 Presence of aortocoronary bypass graft: Secondary | ICD-10-CM | POA: Insufficient documentation

## 2015-12-05 DIAGNOSIS — M48061 Spinal stenosis, lumbar region without neurogenic claudication: Secondary | ICD-10-CM

## 2015-12-05 DIAGNOSIS — Z86718 Personal history of other venous thrombosis and embolism: Secondary | ICD-10-CM | POA: Diagnosis not present

## 2015-12-05 DIAGNOSIS — G8929 Other chronic pain: Secondary | ICD-10-CM | POA: Insufficient documentation

## 2015-12-05 DIAGNOSIS — M4806 Spinal stenosis, lumbar region: Secondary | ICD-10-CM | POA: Insufficient documentation

## 2015-12-05 DIAGNOSIS — Z8249 Family history of ischemic heart disease and other diseases of the circulatory system: Secondary | ICD-10-CM | POA: Insufficient documentation

## 2015-12-05 DIAGNOSIS — Z6828 Body mass index (BMI) 28.0-28.9, adult: Secondary | ICD-10-CM | POA: Insufficient documentation

## 2015-12-05 DIAGNOSIS — D472 Monoclonal gammopathy: Secondary | ICD-10-CM | POA: Insufficient documentation

## 2015-12-05 DIAGNOSIS — Z833 Family history of diabetes mellitus: Secondary | ICD-10-CM | POA: Insufficient documentation

## 2015-12-05 DIAGNOSIS — E785 Hyperlipidemia, unspecified: Secondary | ICD-10-CM | POA: Diagnosis present

## 2015-12-05 DIAGNOSIS — Z9049 Acquired absence of other specified parts of digestive tract: Secondary | ICD-10-CM | POA: Insufficient documentation

## 2015-12-05 DIAGNOSIS — Z720 Tobacco use: Secondary | ICD-10-CM | POA: Insufficient documentation

## 2015-12-05 DIAGNOSIS — I739 Peripheral vascular disease, unspecified: Secondary | ICD-10-CM | POA: Insufficient documentation

## 2015-12-05 LAB — URINALYSIS COMPLETE WITH MICROSCOPIC (ARMC ONLY)
BACTERIA UA: NONE SEEN
Bilirubin Urine: NEGATIVE
Ketones, ur: NEGATIVE mg/dL
Nitrite: NEGATIVE
Protein, ur: 100 mg/dL — AB
SQUAMOUS EPITHELIAL / LPF: NONE SEEN
Specific Gravity, Urine: 1.014 (ref 1.005–1.030)
pH: 5 (ref 5.0–8.0)

## 2015-12-05 LAB — CBC WITH DIFFERENTIAL/PLATELET
BASOS ABS: 0 10*3/uL (ref 0–0.1)
Basophils Relative: 1 %
Eosinophils Absolute: 0.2 10*3/uL (ref 0–0.7)
Eosinophils Relative: 3 %
HEMATOCRIT: 42.3 % (ref 40.0–52.0)
Hemoglobin: 14.2 g/dL (ref 13.0–18.0)
LYMPHS PCT: 14 %
Lymphs Abs: 1 10*3/uL (ref 1.0–3.6)
MCH: 29.6 pg (ref 26.0–34.0)
MCHC: 33.5 g/dL (ref 32.0–36.0)
MCV: 88.4 fL (ref 80.0–100.0)
MONO ABS: 0.6 10*3/uL (ref 0.2–1.0)
Monocytes Relative: 9 %
NEUTROS ABS: 5.4 10*3/uL (ref 1.4–6.5)
Neutrophils Relative %: 73 %
Platelets: 204 10*3/uL (ref 150–440)
RBC: 4.79 MIL/uL (ref 4.40–5.90)
RDW: 18.6 % — AB (ref 11.5–14.5)
WBC: 7.3 10*3/uL (ref 3.8–10.6)

## 2015-12-05 LAB — URINE DRUG SCREEN, QUALITATIVE (ARMC ONLY)
AMPHETAMINES, UR SCREEN: NOT DETECTED
BARBITURATES, UR SCREEN: NOT DETECTED
BENZODIAZEPINE, UR SCRN: NOT DETECTED
Cannabinoid 50 Ng, Ur ~~LOC~~: NOT DETECTED
Cocaine Metabolite,Ur ~~LOC~~: NOT DETECTED
MDMA (Ecstasy)Ur Screen: NOT DETECTED
METHADONE SCREEN, URINE: NOT DETECTED
Opiate, Ur Screen: NOT DETECTED
Phencyclidine (PCP) Ur S: NOT DETECTED
TRICYCLIC, UR SCREEN: NOT DETECTED

## 2015-12-05 LAB — COMPREHENSIVE METABOLIC PANEL
ALK PHOS: 44 U/L (ref 38–126)
ALT: 24 U/L (ref 17–63)
ANION GAP: 7 (ref 5–15)
AST: 25 U/L (ref 15–41)
Albumin: 3.3 g/dL — ABNORMAL LOW (ref 3.5–5.0)
BUN: 22 mg/dL — ABNORMAL HIGH (ref 6–20)
CALCIUM: 10.1 mg/dL (ref 8.9–10.3)
CO2: 26 mmol/L (ref 22–32)
CREATININE: 1.24 mg/dL (ref 0.61–1.24)
Chloride: 103 mmol/L (ref 101–111)
GFR, EST NON AFRICAN AMERICAN: 54 mL/min — AB (ref >60)
Glucose, Bld: 281 mg/dL — ABNORMAL HIGH (ref 65–99)
Potassium: 4.3 mmol/L (ref 3.5–5.1)
SODIUM: 136 mmol/L (ref 135–145)
TOTAL PROTEIN: 6.6 g/dL (ref 6.5–8.1)
Total Bilirubin: 0.2 mg/dL — ABNORMAL LOW (ref 0.3–1.2)

## 2015-12-05 LAB — GLUCOSE, CAPILLARY
GLUCOSE-CAPILLARY: 275 mg/dL — AB (ref 65–99)
GLUCOSE-CAPILLARY: 310 mg/dL — AB (ref 65–99)
Glucose-Capillary: 294 mg/dL — ABNORMAL HIGH (ref 65–99)

## 2015-12-05 LAB — TROPONIN I: TROPONIN I: 0.03 ng/mL — AB (ref <0.03)

## 2015-12-05 MED ORDER — ASPIRIN EC 81 MG PO TBEC
81.0000 mg | DELAYED_RELEASE_TABLET | Freq: Every day | ORAL | Status: DC
  Administered 2015-12-06 – 2015-12-07 (×2): 81 mg via ORAL
  Filled 2015-12-05 (×2): qty 1

## 2015-12-05 MED ORDER — TAMSULOSIN HCL 0.4 MG PO CAPS
0.4000 mg | ORAL_CAPSULE | Freq: Every day | ORAL | Status: DC
  Administered 2015-12-06 – 2015-12-07 (×2): 0.4 mg via ORAL
  Filled 2015-12-05 (×2): qty 1

## 2015-12-05 MED ORDER — TRAMADOL HCL 50 MG PO TABS
50.0000 mg | ORAL_TABLET | Freq: Four times a day (QID) | ORAL | Status: DC | PRN
Start: 2015-12-05 — End: 2015-12-07

## 2015-12-05 MED ORDER — DEXTROSE 5 % IV SOLN
1.0000 g | Freq: Once | INTRAVENOUS | Status: AC
  Administered 2015-12-05: 1 g via INTRAVENOUS
  Filled 2015-12-05: qty 10

## 2015-12-05 MED ORDER — ACETAMINOPHEN 650 MG RE SUPP: 650.0000 mg | Freq: Four times a day (QID) | RECTAL | Status: DC | PRN

## 2015-12-05 MED ORDER — ENOXAPARIN SODIUM 40 MG/0.4ML ~~LOC~~ SOLN
40.0000 mg | SUBCUTANEOUS | Status: DC
  Administered 2015-12-06: 40 mg via SUBCUTANEOUS
  Filled 2015-12-05: qty 0.4

## 2015-12-05 MED ORDER — PANTOPRAZOLE SODIUM 40 MG PO TBEC
40.0000 mg | DELAYED_RELEASE_TABLET | Freq: Every day | ORAL | Status: DC
  Administered 2015-12-06 – 2015-12-07 (×2): 40 mg via ORAL
  Filled 2015-12-05 (×2): qty 1

## 2015-12-05 MED ORDER — PREDNISONE 10 MG PO TABS
5.0000 mg | ORAL_TABLET | Freq: Every day | ORAL | Status: DC
  Administered 2015-12-06 – 2015-12-07 (×2): 5 mg via ORAL
  Filled 2015-12-05 (×2): qty 1

## 2015-12-05 MED ORDER — SIMVASTATIN 20 MG PO TABS
20.0000 mg | ORAL_TABLET | Freq: Every day | ORAL | Status: DC
  Administered 2015-12-06: 20 mg via ORAL
  Filled 2015-12-05: qty 1

## 2015-12-05 MED ORDER — ACETAMINOPHEN 325 MG PO TABS: 650.0000 mg | ORAL_TABLET | Freq: Four times a day (QID) | ORAL | Status: DC | PRN

## 2015-12-05 MED ORDER — INSULIN ASPART 100 UNIT/ML ~~LOC~~ SOLN
0.0000 [IU] | Freq: Three times a day (TID) | SUBCUTANEOUS | Status: DC
  Administered 2015-12-06: 9 [IU] via SUBCUTANEOUS
  Administered 2015-12-06 – 2015-12-07 (×3): 5 [IU] via SUBCUTANEOUS
  Administered 2015-12-07: 2 [IU] via SUBCUTANEOUS
  Filled 2015-12-05: qty 9
  Filled 2015-12-05: qty 5
  Filled 2015-12-05: qty 2
  Filled 2015-12-05 (×2): qty 5

## 2015-12-05 MED ORDER — DEXAMETHASONE SODIUM PHOSPHATE 10 MG/ML IJ SOLN
10.0000 mg | Freq: Once | INTRAMUSCULAR | Status: AC
  Administered 2015-12-05: 10 mg via INTRAVENOUS
  Filled 2015-12-05: qty 1

## 2015-12-05 MED ORDER — LORAZEPAM 0.5 MG PO TABS: 0.5000 mg | ORAL_TABLET | Freq: Two times a day (BID) | ORAL | Status: DC | PRN

## 2015-12-05 MED ORDER — INSULIN ASPART 100 UNIT/ML ~~LOC~~ SOLN
0.0000 [IU] | Freq: Every day | SUBCUTANEOUS | Status: DC
  Administered 2015-12-06 (×2): 3 [IU] via SUBCUTANEOUS
  Filled 2015-12-05: qty 2
  Filled 2015-12-05: qty 3

## 2015-12-05 MED ORDER — HYDROXYCHLOROQUINE SULFATE 200 MG PO TABS
200.0000 mg | ORAL_TABLET | Freq: Every day | ORAL | Status: DC
  Administered 2015-12-06 – 2015-12-07 (×2): 200 mg via ORAL
  Filled 2015-12-05 (×2): qty 1

## 2015-12-05 MED ORDER — ONDANSETRON HCL 4 MG PO TABS: 4.0000 mg | ORAL_TABLET | Freq: Four times a day (QID) | ORAL | Status: DC | PRN

## 2015-12-05 MED ORDER — GABAPENTIN 400 MG PO CAPS
400.0000 mg | ORAL_CAPSULE | Freq: Two times a day (BID) | ORAL | Status: DC
  Administered 2015-12-06 – 2015-12-07 (×4): 400 mg via ORAL
  Filled 2015-12-05 (×4): qty 1

## 2015-12-05 MED ORDER — ATENOLOL 25 MG PO TABS
25.0000 mg | ORAL_TABLET | Freq: Every day | ORAL | Status: DC
  Filled 2015-12-05: qty 1

## 2015-12-05 MED ORDER — SODIUM CHLORIDE 0.9% FLUSH
3.0000 mL | Freq: Two times a day (BID) | INTRAVENOUS | Status: DC
  Administered 2015-12-06 – 2015-12-07 (×3): 3 mL via INTRAVENOUS

## 2015-12-05 MED ORDER — SUCRALFATE 1 G PO TABS
1.0000 g | ORAL_TABLET | Freq: Two times a day (BID) | ORAL | Status: DC
  Administered 2015-12-06 – 2015-12-07 (×4): 1 g via ORAL
  Filled 2015-12-05 (×4): qty 1

## 2015-12-05 MED ORDER — ONDANSETRON HCL 4 MG/2ML IJ SOLN: 4.0000 mg | Freq: Four times a day (QID) | INTRAMUSCULAR | Status: DC | PRN

## 2015-12-05 NOTE — ED Notes (Signed)
Pt assisted with urination. Pt stood beside bed using a urinal with 1 nurse assist to stand. Pt legs were stable and planted firmly on ground, pt arms shaky.

## 2015-12-05 NOTE — ED Notes (Signed)
Pt provided diabetic [place] sandwich tray and diet coke to drink. Table set up over lap in stretcher, pt sitting up to eat meal.

## 2015-12-05 NOTE — ED Notes (Signed)
Patient transported to X-ray 

## 2015-12-05 NOTE — ED Notes (Signed)
Patient transported to CT 

## 2015-12-05 NOTE — ED Provider Notes (Addendum)
[facility] Emergency Department Provider Note  ____________________________________________   I have reviewed the triage vital signs and the nursing notes.   HISTORY  Chief Complaint Extremity Weakness and Hyperglycemia    HPI Sean Armstrong is a 75 y.o. male with a history of peripheral neuropathy, sciatica, and known spinal stenosis with left lower extremity weakness which she normally manages with a cane. Patient gets serial steroid shots for this. Most recently was in August of this year, August 2. Patient states that he was getting up from a [employer] recliner 2 days ago and he lost his footing because his leg was weak and he fell. He hit his head. Did not pass out. Since that time as been unable to bear weight on that leg. He does not believe this to be a traumatic event, he has no pain, he states that the leg just feels weak. He denies any focal numbness weakness any other part of his body. Specifically he deniesheadache numbness or weakness in the upper extremities on the right lower extremity. It feels to him that this is an acute worsening of his chronic pain which did not show getting gradually worse over the last several months. He denies incontinence of bowel or bladder. Is difficult to fully appreciate lower extremity discomfort or sensation because of peripheral neuropathy from diabetes however, patient feels that it is at its baseline. He denies any difficulty speaking or other neurologic complaint and he does not feel that he has had hard to pass out.  Was a non-syncopal fall. He did try to walk today with EMS but was unable to bear weight.    Past Medical History:  Diagnosis Date  . Anemia   . Colon polyp   . Coronary artery disease   . Diabetes mellitus without complication ([place])   . FHx: SVT (supraventricular tachycardia) 1998  . GERD (gastroesophageal reflux disease)   . History of DVT (deep vein thrombosis) 2009  . History of pulmonary  embolism 08/2008  . Hyperlipidemia   . Hypertension   . Hypothyroidism   . Lumbar disc disease   . MGUS (monoclonal gammopathy of unknown significance)   . Myocardial infarction ([place])   . Nephrolithiasis   . Osteoarthritis   . Peptic ulcer disease   . Renal insufficiency   . Rheumatoid arthritis [facility])     Patient Active Problem List   Diagnosis Date Noted  . Sepsis ([place]) 06/18/2015    Past Surgical History:  Procedure Laterality Date  . CARDIAC CATHETERIZATION    . CHOLECYSTECTOMY    . COLONOSCOPY  03/2007, 2006  . CORONARY ARTERY BYPASS GRAFT    . ESOPHAGOGASTRODUODENOSCOPY (EGD) WITH PROPOFOL N/A 07/02/2015   Procedure: ESOPHAGOGASTRODUODENOSCOPY (EGD) WITH PROPOFOL;  Surgeon: Manya Silvas, MD;  Location: [facility] ENDOSCOPY;  Service: Endoscopy;  Laterality: N/A;  . EYE SURGERY    . JOINT REPLACEMENT    . REPLACEMENT TOTAL KNEE BILATERAL    . TOTAL HIP ARTHROPLASTY Right     Prior to Admission medications   Medication Sig Start Date End Date Taking? Authorizing Provider  aspirin EC 81 MG tablet Take 81 mg by mouth daily.    Historical Provider, MD  atenolol (TENORMIN) 25 MG tablet Take 25 mg by mouth daily.    Historical Provider, MD  cetirizine (ZYRTEC) 10 MG tablet Take 10 mg by mouth daily.    Historical Provider, MD  fluticasone (FLONASE) 50 MCG/ACT nasal spray Place 2 sprays into both nostrils daily.  Historical Provider, MD  folic acid (FOLVITE) 1 MG tablet Take 1 mg by mouth daily.    Historical Provider, MD  gabapentin (NEURONTIN) 400 MG capsule Take 400 mg by mouth 2 (two) times daily.     Historical Provider, MD  glimepiride (AMARYL) 2 MG tablet Take 2 mg by mouth 2 (two) times daily.     Historical Provider, MD  hydroxychloroquine (PLAQUENIL) 200 MG tablet Take 200 mg by mouth daily.    Historical Provider, MD  Insulin Detemir (LEVEMIR FLEXTOUCH) 100 UNIT/ML Pen Inject 48 Units into the skin at bedtime.    Historical Provider, MD  loperamide (IMODIUM) 2 MG  capsule Take 1 capsule (2 mg total) by mouth every 4 (four) hours as needed for diarrhea or loose stools. 06/20/15   Srikar Sudini, MD  LORazepam (ATIVAN) 0.5 MG tablet Take 0.5 mg by mouth 2 (two) times daily as needed for anxiety.     Historical Provider, MD  methotrexate (RHEUMATREX) 2.5 MG tablet Take 12.5 mg by mouth once a week. Pt takes on Wednesday.  Caution:Chemotherapy. Protect from light.    Historical Provider, MD  Multiple Vitamins-Minerals (CENTRUM SILVER PO) Take by mouth.    Historical Provider, MD  omeprazole (PRILOSEC) 20 MG capsule Take 40 mg by mouth daily.    Historical Provider, MD  potassium chloride SA (K-DUR,KLOR-CON) 20 MEQ tablet Take 40 mEq by mouth daily.     Historical Provider, MD  predniSONE (DELTASONE) 5 MG tablet Take 5 mg by mouth daily with breakfast.    Historical Provider, MD  ranitidine (ZANTAC) 150 MG tablet  05/07/15   Historical Provider, MD  simvastatin (ZOCOR) 20 MG tablet Take 20 mg by mouth at bedtime.    Historical Provider, MD  sucralfate (CARAFATE) 1 g tablet Take by mouth. 06/05/15 06/04/16  Historical Provider, MD  tamsulosin (FLOMAX) 0.4 MG CAPS capsule Take 0.4 mg by mouth daily after breakfast.     Historical Provider, MD  traMADol (ULTRAM) 50 MG tablet Take 50 mg by mouth. 01/22/15   Historical Provider, MD  triamterene-hydrochlorothiazide (MAXZIDE) 75-50 MG tablet Take 1 tablet by mouth daily.    Historical Provider, MD    Allergies Gentamycin [gentamicin]; Iodinated diagnostic agents; Meloxicam; Penicillin v potassium; Celecoxib; Ciprofloxacin; Hydrocodone; and Propranolol  History reviewed. No pertinent family history.  Social History Social History  Substance Use Topics  . Smoking status: Former Smoker    Types: Cigarettes  . Smokeless tobacco: Current User    Types: Chew     Comment: quit smoking cigerates 1995  . Alcohol use No    Review of Systems Constitutional: No fever/chills Eyes: No visual changes. ENT: No sore throat. No  stiff neck no neck pain Cardiovascular: Denies chest pain. Respiratory: Denies shortness of breath. Gastrointestinal:   no vomiting.  No diarrhea.  No constipation. Genitourinary: Negative for dysuria. Musculoskeletal: Negative lower extremity swelling Skin: Negative for rash. Neurological: See history of present illness 10-point ROS otherwise negative.  ____________________________________________   PHYSICAL EXAM:  VITAL SIGNS: ED Triage Vitals  Enc Vitals Group     BP 12/05/15 1809 (!) 149/78     Pulse Rate 12/05/15 1809 63     Resp 12/05/15 1809 19     Temp 12/05/15 1809 98.1 F (36.7 C)     Temp Source 12/05/15 1809 Oral     SpO2 12/05/15 1809 97 %     Weight 12/05/15 1810 190 lb (86.2 kg)     Height 12/05/15 1810 5\' 8"  (1.727  m)     Head Circumference --      Peak Flow --      Pain Score --      Pain Loc --      Pain Edu? --      Excl. in [place]? --     Constitutional: Alert and oriented. Well appearing and in no acute distress. Eyes: Conjunctivae are normal. PERRL. EOMI. Head: Atraumatic. Nose: No congestion/rhinnorhea. Mouth/Throat: Mucous membranes are moist.  Oropharynx non-erythematous. Neck: No stridor.   Nontender with no meningismus Cardiovascular: Normal rate, regular rhythm. Grossly normal heart sounds.  Good peripheral circulation. Respiratory: Normal respiratory effort.  No retractions. Lungs CTAB. Abdominal: Soft and nontender. No distention. No guarding no rebound Back:  There is no focal tenderness or step off.  there is no midline tenderness there are no lesions noted. there is no CVA tenderness Musculoskeletal: No lower extremity tenderness, no upper extremity tenderness. No joint effusions, no DVT signs strong distal pulses no edema Neurologic:  Normal speech and language. Cranial nerves are grossly intact. Patient has a history of Bell's palsy but I do not appreciate any weakness on the right where his Bell's palsy as. Patient's bilateral upper  extremity and finger to nose is within normal limits right lower extremity has no drift. Left lower shortly does have a drift but he is able to hold it up against gravity for partially 7 seconds. No saddle anesthesia Skin:  Skin is warm, dry and intact. No rash noted. Psychiatric: Mood and affect are normal. Speech and behavior are normal.  ____________________________________________   LABS (all labs ordered are listed, but only abnormal results are displayed)  Labs Reviewed  GLUCOSE, CAPILLARY - Abnormal; Notable for the following:       Result Value   Glucose-Capillary 275 (*)    All other components within normal limits  CBC WITH DIFFERENTIAL/PLATELET  COMPREHENSIVE METABOLIC PANEL  URINE DRUG SCREEN, QUALITATIVE ([facility] ONLY)  CBG MONITORING, ED   ____________________________________________  EKG  I personally interpreted any EKGs ordered by me or triage Atrial fibrillation rate is 81 bpm, normal axis and narrow complex no acute ST elevation or depression, poor R-wave progression noted ____________________________________________  RADIOLOGY  I reviewed any imaging ordered by me or triage that were performed during my shift and, if possible, patient and/or family made aware of any abnormal findings. ____________________________________________   PROCEDURES  Procedure(s) performed: None  Procedures  Critical Care performed: None  ____________________________________________   INITIAL IMPRESSION / ASSESSMENT AND PLAN / ED COURSE  Pertinent labs & imaging results that were available during my care of the patient were reviewed by me and considered in my medical decision making (see chart for details).  Patient here after a non-syncopal fall he did hit his head, he has a progressively worsening left lower extremity weakness which got to the point where he cannot bear weight. He is otherwise well-appearing. Does not appear likely that this was a CVA with it wasn't  happened 2 days ago and recently is outside the window for TPA. We did a CT scan of the head. Patient is in a narrow complex and I do not see P waves and worried that he may be in atrial fibrillation. This only couldn't increase his risk for CVA. More likely however given the current intensity of his complaint is that he has a progressive spinal stenosis which is got to the point where he is having difficulty bearing weight. He may require MR for this. I will  obtain x-ray of the lower lumbar spine. Patient has had MRIs but not for 3 years. He has prostate hypertrophy but not known cancer. An x-ray may be a good screening test for possible metastases. If that is negative, patient may require further imaging. In addition he has an abnormal EKG which does appear to be possibly atrial fibrillation. This was a new finding for the patient. We will continue to assess her closely here..  ----------------------------------------- 7:16 PM on 12/05/2015 -----------------------------------------  Signed out at the end of my shift to dr Jimmye Norman  Clinical Course   ____________________________________________   FINAL CLINICAL IMPRESSION(S) / ED DIAGNOSES  Final diagnoses:  None      This chart was dictated using voice recognition software.  Despite best efforts to proofread,  errors can occur which can change meaning.      Schuyler Amor, MD 12/05/15 1900    Schuyler Amor, MD 12/05/15 [phone]

## 2015-12-05 NOTE — ED Triage Notes (Addendum)
Pt arrived by EMS from home for weakness that has gradually increased over the last six months. Patient reports not being able to put weight on L leg since Monday. Denies pain in leg. Pt has HX of bells palsy and diabetes. Pt reports falling on Sunday. Blood sugar per EMS 343

## 2015-12-05 NOTE — ED Notes (Signed)
Called lab to make sure they had blood tube to add on troponin, they stated they could run the test.

## 2015-12-05 NOTE — ED Notes (Signed)
Dr Williams at bedside 

## 2015-12-05 NOTE — ED Provider Notes (Signed)
Labs Reviewed  CBC WITH DIFFERENTIAL/PLATELET - Abnormal; Notable for the following:       Result Value   RDW 18.6 (*)    All other components within normal limits  COMPREHENSIVE METABOLIC PANEL - Abnormal; Notable for the following:    Glucose, Bld 281 (*)    BUN 22 (*)    Albumin 3.3 (*)    Total Bilirubin 0.2 (*)    GFR calc non Af Amer 54 (*)    All other components within normal limits  GLUCOSE, CAPILLARY - Abnormal; Notable for the following:    Glucose-Capillary 275 (*)    All other components within normal limits  URINE DRUG SCREEN, QUALITATIVE ([facility] ONLY)  CBG MONITORING, ED    Patient is in no distress, states he is too weak to walk and has particular weakness in the left leg. Xray revealed  IMPRESSION: Lower lumbar predominant degenerative disc disease and facet arthrosis. No acute fracture or listhesis. We will recommend admission, MRI and PT consult. He may also have new onset atrial fibrillation which will need to be addressed as well.   Earleen Newport, MD 12/05/15 2004

## 2015-12-05 NOTE — ED Notes (Signed)
Called lab to make sure they still had urine to add on UA, they stated they could run the test.

## 2015-12-05 NOTE — ED Notes (Signed)
Pt back from CT

## 2015-12-05 NOTE — H&P (Signed)
[facility] [place] NAME: Sean Armstrong    MR#:  [id number]  DATE OF BIRTH:  1941/03/15  DATE OF ADMISSION:  12/05/2015  PRIMARY CARE PHYSICIAN: Albina Billet, MD   REQUESTING/REFERRING PHYSICIAN: Jimmye Norman, MD  CHIEF COMPLAINT:   Chief Complaint  Patient presents with  . Extremity Weakness  . Hyperglycemia    HISTORY OF PRESENT ILLNESS:  Sean Armstrong  is a 75 y.o. male who presents with Progressive left lower extremity weakness. Patient states that he's had some general weakness for some time, but states that his left leg has acutely become weaker. His son is in the room with him and states that patient had a fall a couple of days ago, but that his left leg weakness began this morning. Workup here in the ED showed likely UTI, and x-ray of the lumbar spine showed significant degenerative changes. Patient denies anything like saddle anesthesia or difficulty controlling bowel movements. He has had some urinary hesitancy, but states that he has had this for some time due to BPH.  PAST MEDICAL HISTORY:   Past Medical History:  Diagnosis Date  . Anemia   . Colon polyp   . Coronary artery disease   . Diabetes mellitus without complication ([place])   . FHx: SVT (supraventricular tachycardia) 1998  . GERD (gastroesophageal reflux disease)   . History of DVT (deep vein thrombosis) 2009  . History of pulmonary embolism 08/2008  . Hyperlipidemia   . Hypertension   . Hypothyroidism   . Lumbar disc disease   . MGUS (monoclonal gammopathy of unknown significance)   . Myocardial infarction ([place])   . Nephrolithiasis   . Osteoarthritis   . Peptic ulcer disease   . Renal insufficiency   . Rheumatoid arthritis ([place])     PAST SURGICAL HISTORY:   Past Surgical History:  Procedure Laterality Date  . CARDIAC CATHETERIZATION    . CHOLECYSTECTOMY    . COLONOSCOPY  03/2007, 2006  . CORONARY ARTERY BYPASS GRAFT    . ESOPHAGOGASTRODUODENOSCOPY  (EGD) WITH PROPOFOL N/A 07/02/2015   Procedure: ESOPHAGOGASTRODUODENOSCOPY (EGD) WITH PROPOFOL;  Surgeon: Manya Silvas, MD;  Location: [facility] ENDOSCOPY;  Service: Endoscopy;  Laterality: N/A;  . EYE SURGERY    . JOINT REPLACEMENT    . REPLACEMENT TOTAL KNEE BILATERAL    . TOTAL HIP ARTHROPLASTY Right     SOCIAL HISTORY:   Social History  Substance Use Topics  . Smoking status: Former Smoker    Types: Cigarettes  . Smokeless tobacco: Current User    Types: Chew     Comment: quit smoking cigerates 1995  . Alcohol use No    FAMILY HISTORY:   Family History  Problem Relation Age of Onset  . Heart attack Father   . Diabetes Father   . Heart attack Brother     DRUG ALLERGIES:   Allergies  Allergen Reactions  . Gentamycin [Gentamicin] Other (See Comments)    Reaction:  Caused kidney issues for pt   . Iodinated Diagnostic Agents     Other reaction(s): Other (See Comments) "Burning sensation throughout whole body"  . Meloxicam     Other reaction(s): Other (See Comments) GI UPSET  . Penicillin V Potassium     Other reaction(s): Other (See Comments) No effect with ampicillin  . Celecoxib Nausea Only    Other reaction(s): Other (See Comments) GI upset  . Ciprofloxacin Nausea Only  . Hydrocodone Nausea Only  . Propranolol Palpitations  MEDICATIONS AT HOME:   Prior to Admission medications   Medication Sig Start Date End Date Taking? Authorizing Provider  aspirin EC 81 MG tablet Take 81 mg by mouth daily.   Yes Historical Provider, MD  atenolol (TENORMIN) 25 MG tablet Take 25 mg by mouth daily.   Yes Historical Provider, MD  cetirizine (ZYRTEC) 10 MG tablet Take 10 mg by mouth daily.   Yes Historical Provider, MD  fluticasone (FLONASE) 50 MCG/ACT nasal spray Place 2 sprays into both nostrils daily.   Yes Historical Provider, MD  folic acid (FOLVITE) 1 MG tablet Take 1 mg by mouth daily.   Yes Historical Provider, MD  gabapentin (NEURONTIN) 400 MG capsule Take 400 mg  by mouth 2 (two) times daily.    Yes Historical Provider, MD  glimepiride (AMARYL) 2 MG tablet Take 2 mg by mouth 2 (two) times daily.    Yes Historical Provider, MD  hydroxychloroquine (PLAQUENIL) 200 MG tablet Take 200 mg by mouth daily.   Yes Historical Provider, MD  Insulin Detemir (LEVEMIR FLEXTOUCH) 100 UNIT/ML Pen Inject 48 Units into the skin at bedtime.   Yes Historical Provider, MD  loperamide (IMODIUM) 2 MG capsule Take 1 capsule (2 mg total) by mouth every 4 (four) hours as needed for diarrhea or loose stools. 06/20/15  Yes Srikar Sudini, MD  LORazepam (ATIVAN) 0.5 MG tablet Take 0.5 mg by mouth 2 (two) times daily as needed for anxiety.    Yes Historical Provider, MD  methotrexate (RHEUMATREX) 2.5 MG tablet Take 12.5 mg by mouth once a week. Pt takes on Wednesday.  Caution:Chemotherapy. Protect from light.   Yes Historical Provider, MD  Multiple Vitamins-Minerals (CENTRUM SILVER PO) Take 1 tablet by mouth daily.    Yes Historical Provider, MD  omeprazole (PRILOSEC) 20 MG capsule Take 40 mg by mouth daily.   Yes Historical Provider, MD  potassium chloride SA (K-DUR,KLOR-CON) 20 MEQ tablet Take 40 mEq by mouth daily.    Yes Historical Provider, MD  predniSONE (DELTASONE) 5 MG tablet Take 5 mg by mouth daily with breakfast.   Yes Historical Provider, MD  ranitidine (ZANTAC) 150 MG tablet Take 150 mg by mouth 2 (two) times daily.  05/07/15  Yes Historical Provider, MD  simvastatin (ZOCOR) 20 MG tablet Take 20 mg by mouth at bedtime.   Yes Historical Provider, MD  sucralfate (CARAFATE) 1 g tablet Take 1 g by mouth 2 (two) times daily.  06/05/15 06/04/16 Yes Historical Provider, MD  tamsulosin (FLOMAX) 0.4 MG CAPS capsule Take 0.4 mg by mouth daily after breakfast.    Yes Historical Provider, MD  traMADol (ULTRAM) 50 MG tablet Take 50 mg by mouth every 6 (six) hours as needed.  01/22/15  Yes Historical Provider, MD  triamterene-hydrochlorothiazide (MAXZIDE) 75-50 MG tablet Take 1 tablet by mouth  daily.   Yes Historical Provider, MD    REVIEW OF SYSTEMS:  Review of Systems  Constitutional: Negative for chills, fever, malaise/fatigue and weight loss.  HENT: Negative for ear pain, hearing loss and tinnitus.   Eyes: Negative for blurred vision, double vision, pain and redness.  Respiratory: Negative for cough, hemoptysis and shortness of breath.   Cardiovascular: Negative for chest pain, palpitations, orthopnea and leg swelling.  Gastrointestinal: Negative for abdominal pain, constipation, diarrhea, nausea and vomiting.  Genitourinary: Negative for dysuria, frequency and hematuria.       +hesitancy  Musculoskeletal: Positive for falls. Negative for back pain, joint pain and neck pain.  Skin:  No acne, rash, or lesions  Neurological: Positive for focal weakness. Negative for dizziness and tremors.  Endo/Heme/Allergies: Negative for polydipsia. Does not bruise/bleed easily.  Psychiatric/Behavioral: Negative for depression. The patient is not nervous/anxious and does not have insomnia.      VITAL SIGNS:   Vitals:   12/05/15 1852 12/05/15 1900 12/05/15 1930 12/05/15 2030  BP:  (!) 144/71 134/77 (!) 149/64  Pulse: 60 61 (!) 50 63  Resp: 14 12 11 14   Temp:      TempSrc:      SpO2: 98% 96% 97% 100%  Weight:      Height:       Wt Readings from Last 3 Encounters:  12/05/15 86.2 kg (190 lb)  08/03/15 88.7 kg (195 lb 8.8 oz)  07/02/15 88.5 kg (195 lb)    PHYSICAL EXAMINATION:  Physical Exam  Vitals reviewed. Constitutional: He is oriented to person, place, and time. He appears well-developed and well-nourished. No distress.  HENT:  Head: Normocephalic and atraumatic.  Mouth/Throat: Oropharynx is clear and moist.  Eyes: Conjunctivae and EOM are normal. Pupils are equal, round, and reactive to light. No scleral icterus.  Neck: Normal range of motion. Neck supple. No JVD present. No thyromegaly present.  Cardiovascular: Normal rate and intact distal pulses.  Exam reveals  no gallop and no friction rub.   No murmur heard. Irregular rhythm  Respiratory: Effort normal and breath sounds normal. No respiratory distress. He has no wheezes. He has no rales.  GI: Soft. Bowel sounds are normal. He exhibits no distension. There is no tenderness.  Musculoskeletal: Normal range of motion. He exhibits no edema.  No arthritis, no gout  Lymphadenopathy:    He has no cervical adenopathy.  Neurological: He is alert and oriented to person, place, and time. No cranial nerve deficit.  No dysarthria, no aphasia  Skin: Skin is warm and dry. No rash noted. No erythema.  Psychiatric: He has a normal mood and affect. His behavior is normal. Judgment and thought content normal.    LABORATORY PANEL:   CBC  Recent Labs Lab 12/05/15 1809  WBC 7.3  HGB 14.2  HCT 42.3  PLT 204   ------------------------------------------------------------------------------------------------------------------  Chemistries   Recent Labs Lab 12/05/15 1809  NA 136  K 4.3  CL 103  CO2 26  GLUCOSE 281*  BUN 22*  CREATININE 1.24  CALCIUM 10.1  AST 25  ALT 24  ALKPHOS 44  BILITOT 0.2*   ------------------------------------------------------------------------------------------------------------------  Cardiac Enzymes  Recent Labs Lab 12/05/15 1809  TROPONINI 0.03*   ------------------------------------------------------------------------------------------------------------------  RADIOLOGY:  Dg Lumbar Spine Complete  Result Date: 12/05/2015 CLINICAL DATA:  Recent fall. EXAM: LUMBAR SPINE - COMPLETE 4+ VIEW COMPARISON:  None. FINDINGS: Five non-rib-bearing lumbar vertebral bodies. There is extensive osteophyte formation throughout the lumbar spine. No acute fracture or listhesis. There is narrowing of the intervertebral disc spaces from L3-L5 with associated facet arthrosis. IMPRESSION: Lower lumbar predominant degenerative disc disease and facet arthrosis. No acute fracture or  listhesis. Electronically Signed   By: Ulyses Jarred M.D.   On: 12/05/2015 19:12   Ct Head Wo Contrast  Result Date: 12/05/2015 CLINICAL DATA:  Gradually worsening weakness for 6 months. Unable to bear weight on LEFT leg. History of diabetes, hypertension, Bell's palsy. EXAM: CT HEAD WITHOUT CONTRAST TECHNIQUE: Contiguous axial images were obtained from the base of the skull through the vertex without intravenous contrast. COMPARISON:  MRI of the head Jul 20, 2012 FINDINGS: BRAIN: The ventricles and sulci are  normal for age. No intraparenchymal hemorrhage, mass effect nor midline shift. Patchy supratentorial white matter hypodensities less than expected for patient's age, though non-specific are most compatible with chronic small vessel ischemic disease. No acute large vascular territory infarcts. No abnormal extra-axial fluid collections. Basal cisterns are patent. VASCULAR: Moderate calcific atherosclerosis of the carotid siphons. SKULL: No skull fracture. No significant scalp soft tissue swelling. SINUSES/ORBITS: The mastoid air-cells and included paranasal sinuses are well-aerated. Status post bilateral ocular lens implants. The included ocular globes and orbital contents are non-suspicious. OTHER: None. IMPRESSION: Negative CT HEAD for age. Electronically Signed   By: Elon Alas M.D.   On: 12/05/2015 18:24    EKG:   Orders placed or performed during the hospital encounter of 12/05/15  . EKG 12-Lead  . EKG 12-Lead    IMPRESSION AND PLAN:  Principal Problem:   Left leg weakness - unclear etiology in setting of likely acute UTI, degenerative lumbar disease, and other strong comorbid conditions.  Will get MRI lumbar spine, treat uti.  If no significant pathology found on MRI, might consider stroke workup or neurology consult, Especially given his new onset A. fib Active Problems:   UTI (lower urinary tract infection) - antibiotics started in ED and continued on admission.  Urine culture  sent.   A-fib ([place]) - seems to be new in onset, as chart review provided no evidence of prior diagnosis (though he has had prior ablations for SVT.  Will get echocardiogram and cardiology consult   Diabetes ([place]) - SSI with corresponding glucose checks and heart healthy/carb modified diet   Anxiety - continue home anxiolytics   GERD (gastroesophageal reflux disease) - home dose PPI   HLD (hyperlipidemia) - home dose statin   BPH (benign prostatic hyperplasia)  All the records are reviewed and case discussed with ED provider. Management plans discussed with the patient and/or family.  DVT PROPHYLAXIS: SubQ lovenox  GI PROPHYLAXIS: PPI  ADMISSION STATUS: Inpatient  CODE STATUS: Full Code Status History    Date Active Date Inactive Code Status Order ID Comments User Context   06/18/2015  5:01 PM 06/20/2015  7:52 PM Full Code [id number]  Epifanio Lesches, MD ED      TOTAL TIME TAKING CARE OF THIS PATIENT: 45 minutes.    Tyerra Loretto Haines 12/05/2015, 10:36 PM  [facility]  Office  [phone]  CC: Primary care physician; Albina Billet, MD

## 2015-12-06 ENCOUNTER — Observation Stay: Payer: Medicare Other

## 2015-12-06 ENCOUNTER — Observation Stay
Admit: 2015-12-06 | Discharge: 2015-12-06 | Disposition: A | Discharge status: Home / Self Care | Payer: Medicare Other | Attending: Internal Medicine | Admitting: Internal Medicine

## 2015-12-06 DIAGNOSIS — R29898 Other symptoms and signs involving the musculoskeletal system: Secondary | ICD-10-CM | POA: Diagnosis not present

## 2015-12-06 DIAGNOSIS — I638 Other cerebral infarction: Secondary | ICD-10-CM | POA: Diagnosis not present

## 2015-12-06 LAB — BASIC METABOLIC PANEL
ANION GAP: 8 (ref 5–15)
BUN: 24 mg/dL — ABNORMAL HIGH (ref 6–20)
CHLORIDE: 104 mmol/L (ref 101–111)
CO2: 24 mmol/L (ref 22–32)
Calcium: 10 mg/dL (ref 8.9–10.3)
Creatinine, Ser: 1.25 mg/dL — ABNORMAL HIGH (ref 0.61–1.24)
GFR calc non Af Amer: 54 mL/min — ABNORMAL LOW (ref >60)
Glucose, Bld: 315 mg/dL — ABNORMAL HIGH (ref 65–99)
POTASSIUM: 4.6 mmol/L (ref 3.5–5.1)
SODIUM: 136 mmol/L (ref 135–145)

## 2015-12-06 LAB — GLUCOSE, CAPILLARY
GLUCOSE-CAPILLARY: 291 mg/dL — AB (ref 65–99)
GLUCOSE-CAPILLARY: 255 mg/dL — AB (ref 65–99)
Glucose-Capillary: 446 mg/dL — ABNORMAL HIGH (ref 65–99)
Glucose-Capillary: 309 mg/dL — ABNORMAL HIGH (ref 65–99)
Glucose-Capillary: 285 mg/dL — ABNORMAL HIGH (ref 65–99)

## 2015-12-06 LAB — CBC
HEMATOCRIT: 42.8 % (ref 40.0–52.0)
Hemoglobin: 14.4 g/dL (ref 13.0–18.0)
MCH: 29.7 pg (ref 26.0–34.0)
MCHC: 33.7 g/dL (ref 32.0–36.0)
MCV: 87.9 fL (ref 80.0–100.0)
PLATELETS: 194 10*3/uL (ref 150–440)
RBC: 4.86 MIL/uL (ref 4.40–5.90)
RDW: 18.1 % — ABNORMAL HIGH (ref 11.5–14.5)
WBC: 6.2 10*3/uL (ref 3.8–10.6)

## 2015-12-06 MED ORDER — INSULIN GLARGINE 100 UNIT/ML ~~LOC~~ SOLN
40.0000 [IU] | Freq: Every day | SUBCUTANEOUS | Status: DC
  Administered 2015-12-06: 40 [IU] via SUBCUTANEOUS
  Filled 2015-12-06 (×2): qty 0.4

## 2015-12-06 MED ORDER — DEXTROSE 5 % IV SOLN
2.0000 g | INTRAVENOUS | Status: DC
  Administered 2015-12-06: 2 g via INTRAVENOUS
  Filled 2015-12-06 (×3): qty 2

## 2015-12-06 NOTE — Progress Notes (Signed)
[facility] [place] NAME: Sean Armstrong    MR#:  [id number]  DATE OF BIRTH:  August 20, 1940  SUBJECTIVE:  CHIEF COMPLAINT:   Chief Complaint  Patient presents with  . Extremity Weakness  . Hyperglycemia   Had some word finding difficulty and left facial droop today. Continues to have weakness in his lower extremities with left greater than right  REVIEW OF SYSTEMS:    Review of Systems  Constitutional: Negative for chills and fever.  HENT: Negative for sore throat.   Eyes: Negative for blurred vision, double vision and pain.  Respiratory: Negative for cough, hemoptysis, shortness of breath and wheezing.   Cardiovascular: Negative for chest pain, palpitations, orthopnea and leg swelling.  Gastrointestinal: Negative for abdominal pain, constipation, diarrhea, heartburn, nausea and vomiting.  Genitourinary: Negative for dysuria and hematuria.  Musculoskeletal: Positive for back pain and falls. Negative for joint pain.  Skin: Negative for rash.  Neurological: Positive for speech change and focal weakness. Negative for sensory change and headaches.  Endo/Heme/Allergies: Does not bruise/bleed easily.  Psychiatric/Behavioral: Negative for depression. The patient is not nervous/anxious.     DRUG ALLERGIES:   Allergies  Allergen Reactions  . Gentamycin [Gentamicin] Other (See Comments)    Reaction:  Caused kidney issues for pt   . Iodinated Diagnostic Agents     Other reaction(s): Other (See Comments) "Burning sensation throughout whole body"  . Meloxicam     Other reaction(s): Other (See Comments) GI UPSET  . Penicillin V Potassium     Other reaction(s): Other (See Comments) No effect with ampicillin  . Celecoxib Nausea Only    Other reaction(s): Other (See Comments) GI upset  . Ciprofloxacin Nausea Only  . Hydrocodone Nausea Only  . Propranolol Palpitations    VITALS:  Blood pressure 133/67, pulse 66, temperature 98.2 F  (36.8 C), temperature source Oral, resp. rate 16, height 5\' 8"  (1.727 m), weight 86.2 kg (190 lb), SpO2 98 %.  PHYSICAL EXAMINATION:   Physical Exam  GENERAL:  75 y.o.-year-old patient lying in the bed with no acute distress.  EYES: Pupils equal, round, reactive to light and accommodation. No scleral icterus. Extraocular muscles intact.  HEENT: Head atraumatic, normocephalic. Oropharynx and nasopharynx clear.  NECK:  Supple, no jugular venous distention. No thyroid enlargement, no tenderness.  LUNGS: Normal breath sounds bilaterally, no wheezing, rales, rhonchi. No use of accessory muscles of respiration.  CARDIOVASCULAR: S1, S2 normal. No murmurs, rubs, or gallops.  ABDOMEN: Soft, nontender, nondistended. Bowel sounds present. No organomegaly or mass.  EXTREMITIES: No cyanosis, clubbing or edema b/l.    NEUROLOGIC: Cranial nerves II through XII are intact. No focal Motor or sensory deficits b/l.   left facial droop PSYCHIATRIC: The patient is alert and oriented x 3.  SKIN: No obvious rash, lesion, or ulcer.   LABORATORY PANEL:   CBC  Recent Labs Lab 12/06/15 0401  WBC 6.2  HGB 14.4  HCT 42.8  PLT 194   ------------------------------------------------------------------------------------------------------------------ Chemistries   Recent Labs Lab 12/05/15 1809 12/06/15 0401  NA 136 136  K 4.3 4.6  CL 103 104  CO2 26 24  GLUCOSE 281* 315*  BUN 22* 24*  CREATININE 1.24 1.25*  CALCIUM 10.1 10.0  AST 25  --   ALT 24  --   ALKPHOS 44  --   BILITOT 0.2*  --    ------------------------------------------------------------------------------------------------------------------  Cardiac Enzymes  Recent Labs Lab 12/05/15 1809  TROPONINI 0.03*   ------------------------------------------------------------------------------------------------------------------  RADIOLOGY:  Dg Lumbar Spine Complete  Result Date: 12/05/2015 CLINICAL DATA:  Recent fall. EXAM: LUMBAR  SPINE - COMPLETE 4+ VIEW COMPARISON:  None. FINDINGS: Five non-rib-bearing lumbar vertebral bodies. There is extensive osteophyte formation throughout the lumbar spine. No acute fracture or listhesis. There is narrowing of the intervertebral disc spaces from L3-L5 with associated facet arthrosis. IMPRESSION: Lower lumbar predominant degenerative disc disease and facet arthrosis. No acute fracture or listhesis. Electronically Signed   By: Ulyses Jarred M.D.   On: 12/05/2015 19:12   Ct Head Wo Contrast  Result Date: 12/05/2015 CLINICAL DATA:  Gradually worsening weakness for 6 months. Unable to bear weight on LEFT leg. History of diabetes, hypertension, Bell's palsy. EXAM: CT HEAD WITHOUT CONTRAST TECHNIQUE: Contiguous axial images were obtained from the base of the skull through the vertex without intravenous contrast. COMPARISON:  MRI of the head Jul 20, 2012 FINDINGS: BRAIN: The ventricles and sulci are normal for age. No intraparenchymal hemorrhage, mass effect nor midline shift. Patchy supratentorial white matter hypodensities less than expected for patient's age, though non-specific are most compatible with chronic small vessel ischemic disease. No acute large vascular territory infarcts. No abnormal extra-axial fluid collections. Basal cisterns are patent. VASCULAR: Moderate calcific atherosclerosis of the carotid siphons. SKULL: No skull fracture. No significant scalp soft tissue swelling. SINUSES/ORBITS: The mastoid air-cells and included paranasal sinuses are well-aerated. Status post bilateral ocular lens implants. The included ocular globes and orbital contents are non-suspicious. OTHER: None. IMPRESSION: Negative CT HEAD for age. Electronically Signed   By: Elon Alas M.D.   On: 12/05/2015 18:24     ASSESSMENT AND PLAN:   * Facial droop with dysarthria In left lower ext Concern for acute CVA -Check MRI of the brain, Carotid dopplers, Echo - Start aspirin and statin. - Lovenox for DVT  prophylaxis. - PT/OT/Speech consult as needed per symptoms - Consult neurology. Appreciate neurology input  * diabetes mellitus Resume home dose of insulin. Sliding scale insulin. Diabetic diet.  * UTI On antibiotics  * new-onset atrial fibrillation, rate controlled Check echocardiogram. Consult cardiology.   All the records are reviewed and case discussed with Care Management/Social Workerr. Management plans discussed with the patient, family and they are in agreement.  CODE STATUS: FULL CODE  DVT Prophylaxis: SCDs  TOTAL TIME TAKING CARE OF THIS PATIENT: 35 minutes.   POSSIBLE D/C IN 1-2 DAYS, DEPENDING ON CLINICAL CONDITION.  Hillary Bow R M.D on 12/06/2015 at 12:47 PM  Between 7am to 6pm - Pager - [phone]  After 6pm go to [email or web address] - password [email or web address] [facility]  Office  [phone]  CC: Primary care physician; Albina Billet, MD  Note: This dictation was prepared with Dragon dictation along with smaller phrase technology. Any transcriptional errors that result from this process are unintentional.

## 2015-12-06 NOTE — Progress Notes (Signed)
Pharmacy Antibiotic Note  Sean Armstrong is a 75 y.o. male admitted on 12/05/2015 with UTI.  Pharmacy has been consulted for ceftriaxone dosing.  Plan: Ceftriaxone 2 grams q 24 hours ordered.  Height: 5\' 8"  (172.7 cm) Weight: 190 lb (86.2 kg) IBW/kg (Calculated) : 68.4  Temp (24hrs), Avg:98 F (36.7 C), Min:97.8 F (36.6 C), Max:98.1 F (36.7 C)   Recent Labs Lab 12/05/15 1809  WBC 7.3  CREATININE 1.24    Estimated Creatinine Clearance: 53.3 mL/min (by C-G formula based on SCr of 1.24 mg/dL).    Allergies  Allergen Reactions  . Gentamycin [Gentamicin] Other (See Comments)    Reaction:  Caused kidney issues for pt   . Iodinated Diagnostic Agents     Other reaction(s): Other (See Comments) "Burning sensation throughout whole body"  . Meloxicam     Other reaction(s): Other (See Comments) GI UPSET  . Penicillin V Potassium     Other reaction(s): Other (See Comments) No effect with ampicillin  . Celecoxib Nausea Only    Other reaction(s): Other (See Comments) GI upset  . Ciprofloxacin Nausea Only  . Hydrocodone Nausea Only  . Propranolol Palpitations    Antimicrobials this admission: ceftriaxone  >>    >>   Dose adjustments this admission:   Microbiology results: 9/20 UCx: pending    9/20 UA: LE(+) NO2(-) WBC TNTC   Thank you for allowing pharmacy to be a part of this patient's care.  Zakaria Sedor S 12/06/2015 12:04 AM

## 2015-12-06 NOTE — Consult Note (Addendum)
Referring Physician: Sudini    Chief Complaint: LLE weakness  HPI: Sean Armstrong is an 75 y.o. male history of peripheral neuropathy, sciatica, and known spinal stenosis with left lower extremity weakness (ambulates with a cane and receives serial steroid injections-8/2).  Patient states that he was getting up from a [employer] recliner 2 days ago and he lost his footing because his leg was weak and he fell. He hit his head. Did not pass out. Since that time as been unable to bear weight on that leg. He has no pain, he states that the leg just feels weak when standing. He denies any focal numbness weakness any other part of his body. Specifically he deniesheadache numbness or weakness in the upper extremities on the right lower extremity. It feels to him that this is an acute worsening of his chronic neuropathic pain.   He denies incontinence of bowel or bladder. He does report more difficulty getting his words out.  Was found to have new onset afib as well.    Date last known well: Date: 12/02/2015 Time last known well: Time: 22:00 tPA Given: No: Outside time window  Past Medical History:  Diagnosis Date  . Anemia   . Colon polyp   . Coronary artery disease   . Diabetes mellitus without complication ([place])   . FHx: SVT (supraventricular tachycardia) 1998  . GERD (gastroesophageal reflux disease)   . History of DVT (deep vein thrombosis) 2009  . History of pulmonary embolism 08/2008  . Hyperlipidemia   . Hypertension   . Hypothyroidism   . Lumbar disc disease   . MGUS (monoclonal gammopathy of unknown significance)   . Myocardial infarction ([place])   . Nephrolithiasis   . Osteoarthritis   . Peptic ulcer disease   . Renal insufficiency   . Rheumatoid arthritis [facility])     Past Surgical History:  Procedure Laterality Date  . CARDIAC CATHETERIZATION    . CHOLECYSTECTOMY    . COLONOSCOPY  03/2007, 2006  . CORONARY ARTERY BYPASS GRAFT    . ESOPHAGOGASTRODUODENOSCOPY (EGD) WITH PROPOFOL  N/A 07/02/2015   Procedure: ESOPHAGOGASTRODUODENOSCOPY (EGD) WITH PROPOFOL;  Surgeon: Manya Silvas, MD;  Location: [facility] ENDOSCOPY;  Service: Endoscopy;  Laterality: N/A;  . EYE SURGERY    . JOINT REPLACEMENT    . REPLACEMENT TOTAL KNEE BILATERAL    . TOTAL HIP ARTHROPLASTY Right     Family History  Problem Relation Age of Onset  . Heart attack Father   . Diabetes Father   . Heart attack Brother    Social History:  reports that he has quit smoking. His smoking use included Cigarettes. His smokeless tobacco use includes Chew. He reports that he does not drink alcohol or use drugs.  Allergies:  Allergies  Allergen Reactions  . Gentamycin [Gentamicin] Other (See Comments)    Reaction:  Caused kidney issues for pt   . Iodinated Diagnostic Agents     Other reaction(s): Other (See Comments) "Burning sensation throughout whole body"  . Meloxicam     Other reaction(s): Other (See Comments) GI UPSET  . Penicillin V Potassium     Other reaction(s): Other (See Comments) No effect with ampicillin  . Celecoxib Nausea Only    Other reaction(s): Other (See Comments) GI upset  . Ciprofloxacin Nausea Only  . Hydrocodone Nausea Only  . Propranolol Palpitations    Medications:  I have reviewed the patient's current medications. Prior to Admission:  Prescriptions Prior to Admission  Medication Sig Dispense Refill Last  Dose  . aspirin EC 81 MG tablet Take 81 mg by mouth daily.   12/05/2015 at Unknown time  . atenolol (TENORMIN) 25 MG tablet Take 25 mg by mouth daily.   12/05/2015 at Unknown time  . cetirizine (ZYRTEC) 10 MG tablet Take 10 mg by mouth daily.   12/05/2015 at Unknown time  . fluticasone (FLONASE) 50 MCG/ACT nasal spray Place 2 sprays into both nostrils daily.   12/05/2015 at Unknown time  . folic acid (FOLVITE) 1 MG tablet Take 1 mg by mouth daily.   12/05/2015 at Unknown time  . gabapentin (NEURONTIN) 400 MG capsule Take 400 mg by mouth 2 (two) times daily.    12/05/2015 at  Unknown time  . glimepiride (AMARYL) 2 MG tablet Take 2 mg by mouth 2 (two) times daily.    12/05/2015 at Unknown time  . hydroxychloroquine (PLAQUENIL) 200 MG tablet Take 200 mg by mouth daily.   12/05/2015 at Unknown time  . Insulin Detemir (LEVEMIR FLEXTOUCH) 100 UNIT/ML Pen Inject 48 Units into the skin at bedtime.   12/05/2015 at Unknown time  . loperamide (IMODIUM) 2 MG capsule Take 1 capsule (2 mg total) by mouth every 4 (four) hours as needed for diarrhea or loose stools. 20 capsule 0 prn at prn  . LORazepam (ATIVAN) 0.5 MG tablet Take 0.5 mg by mouth 2 (two) times daily as needed for anxiety.    prn at prn  . methotrexate (RHEUMATREX) 2.5 MG tablet Take 12.5 mg by mouth once a week. Pt takes on Wednesday.  Caution:Chemotherapy. Protect from light.   Past Week at Unknown time  . Multiple Vitamins-Minerals (CENTRUM SILVER PO) Take 1 tablet by mouth daily.    12/05/2015 at Unknown time  . omeprazole (PRILOSEC) 20 MG capsule Take 40 mg by mouth daily.   12/05/2015 at Unknown time  . potassium chloride SA (K-DUR,KLOR-CON) 20 MEQ tablet Take 40 mEq by mouth daily.    12/05/2015 at Unknown time  . predniSONE (DELTASONE) 5 MG tablet Take 5 mg by mouth daily with breakfast.   12/05/2015 at Unknown time  . ranitidine (ZANTAC) 150 MG tablet Take 150 mg by mouth 2 (two) times daily.    12/05/2015 at Unknown time  . simvastatin (ZOCOR) 20 MG tablet Take 20 mg by mouth at bedtime.   12/04/2015 at Unknown time  . sucralfate (CARAFATE) 1 g tablet Take 1 g by mouth 2 (two) times daily.    12/05/2015 at Unknown time  . tamsulosin (FLOMAX) 0.4 MG CAPS capsule Take 0.4 mg by mouth daily after breakfast.    12/05/2015 at Unknown time  . traMADol (ULTRAM) 50 MG tablet Take 50 mg by mouth every 6 (six) hours as needed.    prn at prn  . triamterene-hydrochlorothiazide (MAXZIDE) 75-50 MG tablet Take 1 tablet by mouth daily.   12/05/2015 at Unknown time   Scheduled: . aspirin EC  81 mg Oral Daily  . atenolol  25 mg Oral  Daily  . cefTRIAXone (ROCEPHIN)  IV  2 g Intravenous Q24H  . enoxaparin (LOVENOX) injection  40 mg Subcutaneous Q24H  . gabapentin  400 mg Oral BID  . hydroxychloroquine  200 mg Oral Daily  . insulin aspart  0-5 Units Subcutaneous QHS  . insulin aspart  0-9 Units Subcutaneous TID WC  . pantoprazole  40 mg Oral Daily  . predniSONE  5 mg Oral Q breakfast  . simvastatin  20 mg Oral QHS  . sodium chloride flush  3 mL  Intravenous Q12H  . sucralfate  1 g Oral BID  . tamsulosin  0.4 mg Oral QPC breakfast    ROS: History obtained from the patient  General ROS: negative for - chills, fatigue, fever, night sweats, weight gain or weight loss Psychological ROS: negative for - behavioral disorder, hallucinations, memory difficulties, mood swings or suicidal ideation Ophthalmic ROS: negative for - blurry vision, double vision, eye pain or loss of vision ENT ROS: negative for - epistaxis, nasal discharge, oral lesions, sore throat, tinnitus or vertigo Allergy and Immunology ROS: negative for - hives or itchy/watery eyes Hematological and Lymphatic ROS: negative for - bleeding problems, bruising or swollen lymph nodes Endocrine ROS: negative for - galactorrhea, hair pattern changes, polydipsia/polyuria or temperature intolerance Respiratory ROS: negative for - cough, hemoptysis, shortness of breath or wheezing Cardiovascular ROS: negative for - chest pain, dyspnea on exertion, edema or irregular heartbeat Gastrointestinal ROS: negative for - abdominal pain, diarrhea, hematemesis, nausea/vomiting or stool incontinence Genito-Urinary ROS: negative for - dysuria, hematuria, incontinence or urinary frequency/urgency Musculoskeletal ROS: negative for - joint swelling or muscular weakness Neurological ROS: as noted in HPI Dermatological ROS: negative for rash and skin lesion changes  Physical Examination: Blood pressure 133/67, pulse 66, temperature 98.2 F (36.8 C), temperature source Oral, resp. rate  16, height 5\' 8"  (1.727 m), weight 86.2 kg (190 lb), SpO2 98 %.  HEENT-  Normocephalic, no lesions, without obvious abnormality.  Normal external eye and conjunctiva.  Normal TM's bilaterally.  Normal auditory canals and external ears. Normal external nose, mucus membranes and septum.  Normal pharynx. Cardiovascular- S1, S2 normal, pulses palpable throughout   Lungs- chest clear, no wheezing, rales, normal symmetric air entry Abdomen- soft, non-tender; bowel sounds normal; no masses,  no organomegaly Extremities- no edema Lymph-no adenopathy palpable Musculoskeletal-no joint tenderness, deformity or swelling Skin-warm and dry, no hyperpigmentation, vitiligo, or suspicious lesions  Neurological Examination Mental Status: Alert, oriented, thought content appropriate.  Speech fluent without evidence of aphasia.  Able to follow 3 step commands without difficulty. Cranial Nerves: II: Discs flat bilaterally; Visual fields grossly normal, pupils equal, round, reactive to light and accommodation III,IV, VI: ptosis not present, extra-ocular motions intact bilaterally V,VII: left facial droop, facial light touch sensation normal bilaterally VIII: hearing normal bilaterally IX,X: gag reflex present XI: bilateral shoulder shrug XII: midline tongue extension Motor: Right : Upper extremity   5/5    Left:     Upper extremity   5/5  Lower extremity   5/5     Lower extremity   5/5 Left leg smaller than right Sensory: Pinprick and light touch intact throughout, bilaterally Deep Tendon Reflexes: 2+ in the upper extremities, absent in the lower extremities Plantars: Right: mute   Left: mute Cerebellar: Normal finger-to-nose with tremor and heel-to-shin testing with dysmetria bilaterally, left greater than right Gait: not tested due to safety concerns   Laboratory Studies:  Basic Metabolic Panel:  Recent Labs Lab 12/05/15 1809 12/06/15 0401  NA 136 136  K 4.3 4.6  CL 103 104  CO2 26 24   GLUCOSE 281* 315*  BUN 22* 24*  CREATININE 1.24 1.25*  CALCIUM 10.1 10.0    Liver Function Tests:  Recent Labs Lab 12/05/15 1809  AST 25  ALT 24  ALKPHOS 44  BILITOT 0.2*  PROT 6.6  ALBUMIN 3.3*   No results for input(s): LIPASE, AMYLASE in the last 168 hours. No results for input(s): AMMONIA in the last 168 hours.  CBC:  Recent Labs Lab  12/05/15 1809 12/06/15 0401  WBC 7.3 6.2  NEUTROABS 5.4  --   HGB 14.2 14.4  HCT 42.3 42.8  MCV 88.4 87.9  PLT 204 194    Cardiac Enzymes:  Recent Labs Lab 12/05/15 1809  TROPONINI 0.03*    BNP: Invalid input(s): POCBNP  CBG:  Recent Labs Lab 12/05/15 1844 12/05/15 2346 12/05/15 2347 12/06/15 0903  GLUCAP 275* 310* 294* 291*    Microbiology: Results for orders placed or performed during the hospital encounter of 06/18/15  Culture, blood (routine x 2)     Status: None   Collection Time: 06/18/15 11:14 AM  Result Value Ref Range Status   Specimen Description BLOOD riv  Final   Special Requests BOTTLES DRAWN AEROBIC AND ANAEROBIC 2ML  Final   Culture NO GROWTH 5 DAYS  Final   Report Status 06/23/2015 FINAL  Final  Culture, blood (routine x 2)     Status: None   Collection Time: 06/18/15 11:22 AM  Result Value Ref Range Status   Specimen Description BLOOD 2ND SET  Final   Special Requests BOTTLES DRAWN AEROBIC AND ANAEROBIC 1ML  Final   Culture NO GROWTH 5 DAYS  Final   Report Status 06/23/2015 FINAL  Final  Urine culture     Status: Abnormal   Collection Time: 06/18/15  2:31 PM  Result Value Ref Range Status   Specimen Description URINE, RANDOM  Final   Special Requests NONE  Final   Culture 50,000 COLONIES/mL ENTEROCOCCUS FAECALIS (A)  Final   Report Status 06/23/2015 FINAL  Final   Organism ID, Bacteria ENTEROCOCCUS FAECALIS (A)  Final      Susceptibility   Enterococcus faecalis - MIC*    AMPICILLIN <=2 SENSITIVE Sensitive     LEVOFLOXACIN >=8 RESISTANT Resistant     NITROFURANTOIN <=16 SENSITIVE  Sensitive     VANCOMYCIN 1 SENSITIVE Sensitive     LINEZOLID 2 SENSITIVE Sensitive     * 50,000 COLONIES/mL ENTEROCOCCUS FAECALIS  Rapid Influenza A&B Antigens ([facility] only)     Status: None   Collection Time: 06/18/15  3:20 PM  Result Value Ref Range Status   Influenza A ([facility]) NEGATIVE NEGATIVE Final   Influenza B ([facility]) NEGATIVE NEGATIVE Final  Gastrointestinal Panel by PCR , Stool     Status: Abnormal   Collection Time: 06/19/15  9:25 AM  Result Value Ref Range Status   Campylobacter species NOT DETECTED NOT DETECTED Final   Plesimonas shigelloides NOT DETECTED NOT DETECTED Final   Salmonella species NOT DETECTED NOT DETECTED Final   Yersinia enterocolitica NOT DETECTED NOT DETECTED Final   Vibrio species NOT DETECTED NOT DETECTED Final   Vibrio cholerae NOT DETECTED NOT DETECTED Final   Enteroaggregative E coli (EAEC) NOT DETECTED NOT DETECTED Final   Enteropathogenic E coli (EPEC) NOT DETECTED NOT DETECTED Final   Enterotoxigenic E coli (ETEC) NOT DETECTED NOT DETECTED Final   Shiga like toxin producing E coli (STEC) NOT DETECTED NOT DETECTED Final   E. coli O157 NOT DETECTED NOT DETECTED Final   Shigella/Enteroinvasive E coli (EIEC) NOT DETECTED NOT DETECTED Final   Cryptosporidium NOT DETECTED NOT DETECTED Final   Cyclospora cayetanensis NOT DETECTED NOT DETECTED Final   Entamoeba histolytica NOT DETECTED NOT DETECTED Final   Giardia lamblia NOT DETECTED NOT DETECTED Final   Adenovirus F40/41 NOT DETECTED NOT DETECTED Final   Astrovirus NOT DETECTED NOT DETECTED Final   Norovirus GI/GII DETECTED (A) NOT DETECTED Final    Comment: CRITICAL RESULT CALLED TO,  READ BACK BY AND VERIFIED WITH: TAMMY TODD AT 1320 ON 06/19/15. CTJ    Rotavirus A NOT DETECTED NOT DETECTED Final   Sapovirus (I, II, IV, and V) NOT DETECTED NOT DETECTED Final  C difficile quick scan w PCR reflex     Status: None   Collection Time: 06/19/15  9:25 AM  Result Value Ref Range Status   C Diff antigen  NEGATIVE NEGATIVE Final   C Diff toxin NEGATIVE NEGATIVE Final   C Diff interpretation Negative for C. difficile  Final    Coagulation Studies: No results for input(s): LABPROT, INR in the last 72 hours.  Urinalysis:  Recent Labs Lab 12/05/15 1809  COLORURINE YELLOW*  LABSPEC 1.014  PHURINE 5.0  GLUCOSEU >500*  HGBUR 1+*  BILIRUBINUR NEGATIVE  KETONESUR NEGATIVE  PROTEINUR 100*  NITRITE NEGATIVE  LEUKOCYTESUR 2+*    Lipid Panel: No results found for: CHOL, TRIG, HDL, CHOLHDL, VLDL, LDLCALC  HgbA1C: No results found for: HGBA1C  Urine Drug Screen:     Component Value Date/Time   LABOPIA NONE DETECTED 12/05/2015 1809   COCAINSCRNUR NONE DETECTED 12/05/2015 1809   LABBENZ NONE DETECTED 12/05/2015 1809   AMPHETMU NONE DETECTED 12/05/2015 1809   THCU NONE DETECTED 12/05/2015 1809   LABBARB NONE DETECTED 12/05/2015 1809    Alcohol Level: No results for input(s): ETH in the last 168 hours.  Other results: EKG: atrial fibrillation, rate 63 bpm.  Imaging: Dg Lumbar Spine Complete  Result Date: 12/05/2015 CLINICAL DATA:  Recent fall. EXAM: LUMBAR SPINE - COMPLETE 4+ VIEW COMPARISON:  None. FINDINGS: Five non-rib-bearing lumbar vertebral bodies. There is extensive osteophyte formation throughout the lumbar spine. No acute fracture or listhesis. There is narrowing of the intervertebral disc spaces from L3-L5 with associated facet arthrosis. IMPRESSION: Lower lumbar predominant degenerative disc disease and facet arthrosis. No acute fracture or listhesis. Electronically Signed   By: Ulyses Jarred M.D.   On: 12/05/2015 19:12   Ct Head Wo Contrast  Result Date: 12/05/2015 CLINICAL DATA:  Gradually worsening weakness for 6 months. Unable to bear weight on LEFT leg. History of diabetes, hypertension, Bell's palsy. EXAM: CT HEAD WITHOUT CONTRAST TECHNIQUE: Contiguous axial images were obtained from the base of the skull through the vertex without intravenous contrast. COMPARISON:  MRI  of the head Jul 20, 2012 FINDINGS: BRAIN: The ventricles and sulci are normal for age. No intraparenchymal hemorrhage, mass effect nor midline shift. Patchy supratentorial white matter hypodensities less than expected for patient's age, though non-specific are most compatible with chronic small vessel ischemic disease. No acute large vascular territory infarcts. No abnormal extra-axial fluid collections. Basal cisterns are patent. VASCULAR: Moderate calcific atherosclerosis of the carotid siphons. SKULL: No skull fracture. No significant scalp soft tissue swelling. SINUSES/ORBITS: The mastoid air-cells and included paranasal sinuses are well-aerated. Status post bilateral ocular lens implants. The included ocular globes and orbital contents are non-suspicious. OTHER: None. IMPRESSION: Negative CT HEAD for age. Electronically Signed   By: Elon Alas M.D.   On: 12/05/2015 18:24    Assessment: 75 y.o. male presenting with LLE weakness.  Has a history of spinal stenosis.  Concerned though that he also has a facial droop and difficulty getting his words out along with new onset atrial fibrillation.  Head CT personally reviewed and shows no acute changes.  Acute infarct remains in the differential.  Further work up recommended.    Stroke Risk Factors - atrial fibrillation, diabetes mellitus, hyperlipidemia and hypertension  Plan: 1. HgbA1c, fasting lipid  panel 2. MRI, MRA  of the brain without contrast 3. PT consult, OT consult, Speech consult 4. Echocardiogram 5. Carotid dopplers 6. Prophylactic therapy-Continue ASA daily 7. NPO until RN stroke swallow screen 8. Telemetry monitoring 9. Frequent neuro checks   Alexis Goodell, MD Neurology [phone] 12/06/2015, 11:02 AM

## 2015-12-06 NOTE — Care Management CC44 (Signed)
Condition Code 44 Documentation Completed  Patient Details  Name: Sean Armstrong MRN: [id number] Date of Birth: 12/27/1940   Condition Code 44 given:  Yes Patient signature on Condition Code 44 notice:  Yes Documentation of 2 MD's agreement:  Yes Code 44 added to claim:  Yes    Jolly Mango, RN 12/06/2015, 9:57 AM

## 2015-12-06 NOTE — Progress Notes (Signed)
Inpatient Diabetes Program Recommendations  AACE/ADA: New Consensus Statement on Inpatient Glycemic Control (2015)  Target Ranges:  Prepandial:   less than 140 mg/dL      Peak postprandial:   less than 180 mg/dL (1-2 hours)      Critically ill patients:  140 - 180 mg/dL   Lab Results  Component Value Date   GLUCAP 446 (H) 12/06/2015    Review of Glycemic Control:  Results for Sean Armstrong, Sean Armstrong (MRN [id number]) as of 12/06/2015 13:36  Ref. Range 12/05/2015 18:44 12/05/2015 23:46 12/05/2015 23:47 12/06/2015 09:03 12/06/2015 12:06  Glucose-Capillary Latest Ref Range: 65 - 99 mg/dL 275 (H) 310 (H) 294 (H) 291 (H) 446 (H)    Diabetes history: Type 2 diabetes-Note A1C pending Outpatient Diabetes medications: Amaryl 2 mg bid, Levemir 48 units q HS Current orders for Inpatient glycemic control:  Novolog sensitive tid with meals and HS Inpatient Diabetes Program Recommendations:    Text page sent to MD.  Consider restarting home dose of Levemir 48 units q HS.   Thanks, Adah Perl, RN, BC-ADM Inpatient Diabetes Coordinator Pager [phone] (8a-5p)

## 2015-12-06 NOTE — Progress Notes (Signed)
*  PRELIMINARY RESULTS* Echocardiogram 2D Echocardiogram has been performed.  [name] 12/06/2015, 4:26 PM

## 2015-12-06 NOTE — Consult Note (Signed)
[facility] Cardiology Consultation Note  Patient ID: Sean Armstrong, MRN: [id number], DOB/AGE: May 28, 1940 75 y.o. Admit date: 12/05/2015   Date of Consult: 12/06/2015 Primary Physician: Albina Billet, MD Primary Cardiologist: Paraschos  Chief Complaint:  Chief Complaint  Patient presents with  . Extremity Weakness  . Hyperglycemia   Reason for Consult: atrial fibrillation  HPI: 75 y.o. male with known coronary artery disease by cardiac catheterization in the past essential hypertension mixed hyperlipidemia having a recent fall of unknown etiology for which the patient hit his head. With that fall the patient has had some concerns of the stroke versus leg weakness for which she has had spinal stenosis as well as some other chronic leg weakness. When seen in the emergency room the patient did have continued leg weakness and some mild facial droop concerning for the possibility of stroke. Early CT scan shows no evidence of stroke at this time. EKG has shown atrial fibrillation with controlled ventricular rate possibly with sick sinus syndrome requiring further intervention. Other telemetry shows no evidence of heart block. The patient has had appropriate medication management for diabetes and insulin injection although currently is on atenolol for heart rate control of atrial fibrillation which may be causing bradycardia and syncope. Currently the patient is having further diagnostic testing to evaluate causes of cardiovascular disease and/or current condition  Past Medical History:  Diagnosis Date  . Anemia   . Colon polyp   . Coronary artery disease   . Diabetes mellitus without complication ([place])   . FHx: SVT (supraventricular tachycardia) 1998  . GERD (gastroesophageal reflux disease)   . History of DVT (deep vein thrombosis) 2009  . History of pulmonary embolism 08/2008  . Hyperlipidemia   . Hypertension   . Hypothyroidism   . Lumbar disc disease   . MGUS (monoclonal gammopathy of  unknown significance)   . Myocardial infarction ([place])   . Nephrolithiasis   . Osteoarthritis   . Peptic ulcer disease   . Renal insufficiency   . Rheumatoid arthritis [facility])       Surgical History:  Past Surgical History:  Procedure Laterality Date  . CARDIAC CATHETERIZATION    . CHOLECYSTECTOMY    . COLONOSCOPY  03/2007, 2006  . CORONARY ARTERY BYPASS GRAFT    . ESOPHAGOGASTRODUODENOSCOPY (EGD) WITH PROPOFOL N/A 07/02/2015   Procedure: ESOPHAGOGASTRODUODENOSCOPY (EGD) WITH PROPOFOL;  Surgeon: Manya Silvas, MD;  Location: [facility] ENDOSCOPY;  Service: Endoscopy;  Laterality: N/A;  . EYE SURGERY    . JOINT REPLACEMENT    . REPLACEMENT TOTAL KNEE BILATERAL    . TOTAL HIP ARTHROPLASTY Right      Home Meds: Prior to Admission medications   Medication Sig Start Date End Date Taking? Authorizing Provider  aspirin EC 81 MG tablet Take 81 mg by mouth daily.   Yes Historical Provider, MD  atenolol (TENORMIN) 25 MG tablet Take 25 mg by mouth daily.   Yes Historical Provider, MD  cetirizine (ZYRTEC) 10 MG tablet Take 10 mg by mouth daily.   Yes Historical Provider, MD  fluticasone (FLONASE) 50 MCG/ACT nasal spray Place 2 sprays into both nostrils daily.   Yes Historical Provider, MD  folic acid (FOLVITE) 1 MG tablet Take 1 mg by mouth daily.   Yes Historical Provider, MD  gabapentin (NEURONTIN) 400 MG capsule Take 400 mg by mouth 2 (two) times daily.    Yes Historical Provider, MD  glimepiride (AMARYL) 2 MG tablet Take 2 mg by mouth 2 (two) times daily.  Yes Historical Provider, MD  hydroxychloroquine (PLAQUENIL) 200 MG tablet Take 200 mg by mouth daily.   Yes Historical Provider, MD  Insulin Detemir (LEVEMIR FLEXTOUCH) 100 UNIT/ML Pen Inject 48 Units into the skin at bedtime.   Yes Historical Provider, MD  loperamide (IMODIUM) 2 MG capsule Take 1 capsule (2 mg total) by mouth every 4 (four) hours as needed for diarrhea or loose stools. 06/20/15  Yes Srikar Sudini, MD  LORazepam (ATIVAN) 0.5  MG tablet Take 0.5 mg by mouth 2 (two) times daily as needed for anxiety.    Yes Historical Provider, MD  methotrexate (RHEUMATREX) 2.5 MG tablet Take 12.5 mg by mouth once a week. Pt takes on Wednesday.  Caution:Chemotherapy. Protect from light.   Yes Historical Provider, MD  Multiple Vitamins-Minerals (CENTRUM SILVER PO) Take 1 tablet by mouth daily.    Yes Historical Provider, MD  omeprazole (PRILOSEC) 20 MG capsule Take 40 mg by mouth daily.   Yes Historical Provider, MD  potassium chloride SA (K-DUR,KLOR-CON) 20 MEQ tablet Take 40 mEq by mouth daily.    Yes Historical Provider, MD  predniSONE (DELTASONE) 5 MG tablet Take 5 mg by mouth daily with breakfast.   Yes Historical Provider, MD  ranitidine (ZANTAC) 150 MG tablet Take 150 mg by mouth 2 (two) times daily.  05/07/15  Yes Historical Provider, MD  simvastatin (ZOCOR) 20 MG tablet Take 20 mg by mouth at bedtime.   Yes Historical Provider, MD  sucralfate (CARAFATE) 1 g tablet Take 1 g by mouth 2 (two) times daily.  06/05/15 06/04/16 Yes Historical Provider, MD  tamsulosin (FLOMAX) 0.4 MG CAPS capsule Take 0.4 mg by mouth daily after breakfast.    Yes Historical Provider, MD  traMADol (ULTRAM) 50 MG tablet Take 50 mg by mouth every 6 (six) hours as needed.  01/22/15  Yes Historical Provider, MD  triamterene-hydrochlorothiazide (MAXZIDE) 75-50 MG tablet Take 1 tablet by mouth daily.   Yes Historical Provider, MD    Inpatient Medications:  . aspirin EC  81 mg Oral Daily  . atenolol  25 mg Oral Daily  . cefTRIAXone (ROCEPHIN)  IV  2 g Intravenous Q24H  . enoxaparin (LOVENOX) injection  40 mg Subcutaneous Q24H  . gabapentin  400 mg Oral BID  . hydroxychloroquine  200 mg Oral Daily  . insulin aspart  0-5 Units Subcutaneous QHS  . insulin aspart  0-9 Units Subcutaneous TID WC  . insulin glargine  40 Units Subcutaneous Daily  . pantoprazole  40 mg Oral Daily  . predniSONE  5 mg Oral Q breakfast  . simvastatin  20 mg Oral QHS  . sodium chloride  flush  3 mL Intravenous Q12H  . sucralfate  1 g Oral BID  . tamsulosin  0.4 mg Oral QPC breakfast      Allergies:  Allergies  Allergen Reactions  . Gentamycin [Gentamicin] Other (See Comments)    Reaction:  Caused kidney issues for pt   . Iodinated Diagnostic Agents     Other reaction(s): Other (See Comments) "Burning sensation throughout whole body"  . Meloxicam     Other reaction(s): Other (See Comments) GI UPSET  . Penicillin V Potassium     Other reaction(s): Other (See Comments) No effect with ampicillin  . Celecoxib Nausea Only    Other reaction(s): Other (See Comments) GI upset  . Ciprofloxacin Nausea Only  . Hydrocodone Nausea Only  . Propranolol Palpitations    Social History   Social History  . Marital status: Married  Spouse name: N/A  . Number of children: N/A  . Years of education: N/A   Occupational History  . Not on file.   Social History Main Topics  . Smoking status: Former Smoker    Types: Cigarettes  . Smokeless tobacco: Current User    Types: Chew     Comment: quit smoking cigerates 1995  . Alcohol use No  . Drug use: No  . Sexual activity: Not on file   Other Topics Concern  . Not on file   Social History Narrative  . No narrative on file     Family History  Problem Relation Age of Onset  . Heart attack Father   . Diabetes Father   . Heart attack Brother      Review of Systems Positive for fall weakness Negative for: General:  chills, fever, night sweats or weight changes.  Cardiovascular: PND orthopnea syncope dizziness  Dermatological skin lesions rashes Respiratory: Cough congestion Urologic: Frequent urination urination at night and hematuria Abdominal: negative for nausea, vomiting, diarrhea, bright red blood per rectum, melena, or hematemesis Neurologic: negative for visual changes, and/or hearing changes  All other systems reviewed and are otherwise negative except as noted above.  Labs:  Recent Labs   12/05/15 1809  TROPONINI 0.03*   Lab Results  Component Value Date   WBC 6.2 12/06/2015   HGB 14.4 12/06/2015   HCT 42.8 12/06/2015   MCV 87.9 12/06/2015   PLT 194 12/06/2015    Recent Labs Lab 12/05/15 1809 12/06/15 0401  NA 136 136  K 4.3 4.6  CL 103 104  CO2 26 24  BUN 22* 24*  CREATININE 1.24 1.25*  CALCIUM 10.1 10.0  PROT 6.6  --   BILITOT 0.2*  --   ALKPHOS 44  --   ALT 24  --   AST 25  --   GLUCOSE 281* 315*   No results found for: CHOL, HDL, LDLCALC, TRIG No results found for: DDIMER  Radiology/Studies:  Dg Lumbar Spine Complete  Result Date: 12/05/2015 CLINICAL DATA:  Recent fall. EXAM: LUMBAR SPINE - COMPLETE 4+ VIEW COMPARISON:  None. FINDINGS: Five non-rib-bearing lumbar vertebral bodies. There is extensive osteophyte formation throughout the lumbar spine. No acute fracture or listhesis. There is narrowing of the intervertebral disc spaces from L3-L5 with associated facet arthrosis. IMPRESSION: Lower lumbar predominant degenerative disc disease and facet arthrosis. No acute fracture or listhesis. Electronically Signed   By: Ulyses Jarred M.D.   On: 12/05/2015 19:12   Ct Head Wo Contrast  Result Date: 12/05/2015 CLINICAL DATA:  Gradually worsening weakness for 6 months. Unable to bear weight on LEFT leg. History of diabetes, hypertension, Bell's palsy. EXAM: CT HEAD WITHOUT CONTRAST TECHNIQUE: Contiguous axial images were obtained from the base of the skull through the vertex without intravenous contrast. COMPARISON:  MRI of the head Jul 20, 2012 FINDINGS: BRAIN: The ventricles and sulci are normal for age. No intraparenchymal hemorrhage, mass effect nor midline shift. Patchy supratentorial white matter hypodensities less than expected for patient's age, though non-specific are most compatible with chronic small vessel ischemic disease. No acute large vascular territory infarcts. No abnormal extra-axial fluid collections. Basal cisterns are patent. VASCULAR: Moderate  calcific atherosclerosis of the carotid siphons. SKULL: No skull fracture. No significant scalp soft tissue swelling. SINUSES/ORBITS: The mastoid air-cells and included paranasal sinuses are well-aerated. Status post bilateral ocular lens implants. The included ocular globes and orbital contents are non-suspicious. OTHER: None. IMPRESSION: Negative CT HEAD for age. Electronically  Signed   By: Elon Alas M.D.   On: 12/05/2015 18:24    EKG: Atrial fibrillation with controlled ventricular rate  Weights: Filed Weights   12/05/15 1810  Weight: 190 lb (86.2 kg)     Physical Exam: Blood pressure 133/67, pulse 66, temperature 98.2 F (36.8 C), temperature source Oral, resp. rate 16, height 5\' 8"  (1.727 m), weight 190 lb (86.2 kg), SpO2 98 %. Body mass index is 28.89 kg/m. General: Well developed, well nourished, in no acute distress. Head eyes ears nose throat: Normocephalic, atraumatic, sclera non-icteric, no xanthomas, nares are without discharge. No apparent thyromegaly and/or mass  Lungs: Normal respiratory effort.  no wheezes, no rales, no rhonchi.  Heart: Irregular with normal S1 S2. no murmur gallop, no rub, PMI is normal size and placement, carotid upstroke normal without bruit, jugular venous pressure is normal Abdomen: Soft, non-tender, non-distended with normoactive bowel sounds. No hepatomegaly. No rebound/guarding. No obvious abdominal masses. Abdominal aorta is normal size without bruit Extremities: No edema. no cyanosis, no clubbing, no ulcers  Peripheral : 2+ bilateral upper extremity pulses, 2+ bilateral femoral pulses, 2+ bilateral dorsal pedal pulse Neuro: Alert and oriented. Positive facial asymmetry. No focal deficit. Moves all extremities spontaneously. Musculoskeletal: Normal muscle tone without kyphosis Psych:  Responds to questions appropriately with a normal affect.    Assessment: 75 year old male with coronary artery disease status post cardiac catheterization  essential hypertension makes hyperlipidemia with an episode of fall and injury with possible strokelike signs and symptoms with an elevated troponin consistent with demand ischemia and/or possible sick sinus syndrome  Plan: 1. Continue supportive care for recent fall and injury 2. Further workup from the neurologic standpoint of a cause of possible stroke including CAT scan Doppler as needed 3. Echocardiogram for LV systolic dysfunction valvular heart disease contributing to above 4. Continue telemetry to follow for significant issues with sick sinus syndrome causing syncope 5. Possible decrease atenolol if necessary due to concerns of sick sinus syndrome and heart block 6. Further diagnostic testing and treatment options after above 7. Further consideration of anticoagulation for atrial fibrillation when further source of fall risk is evaluated  Signed, Corey Skains M.D. [facility] Cardiology 12/06/2015, 1:25 PM

## 2015-12-07 DIAGNOSIS — I638 Other cerebral infarction: Secondary | ICD-10-CM | POA: Diagnosis not present

## 2015-12-07 DIAGNOSIS — I639 Cerebral infarction, unspecified: Secondary | ICD-10-CM | POA: Diagnosis not present

## 2015-12-07 DIAGNOSIS — R29898 Other symptoms and signs involving the musculoskeletal system: Secondary | ICD-10-CM | POA: Diagnosis not present

## 2015-12-07 LAB — HEMOGLOBIN A1C
Hgb A1c MFr Bld: 8.6 % — ABNORMAL HIGH (ref 4.8–5.6)
MEAN PLASMA GLUCOSE: 200 mg/dL

## 2015-12-07 LAB — GLUCOSE, CAPILLARY
GLUCOSE-CAPILLARY: 271 mg/dL — AB (ref 65–99)
Glucose-Capillary: 200 mg/dL — ABNORMAL HIGH (ref 65–99)

## 2015-12-07 LAB — ECHOCARDIOGRAM COMPLETE
Height: 68 in
Weight: 3040 oz

## 2015-12-07 MED ORDER — ATENOLOL 25 MG PO TABS
12.5000 mg | ORAL_TABLET | Freq: Every day | ORAL | 0 refills | Status: AC
Start: 2015-12-07 — End: ?

## 2015-12-07 MED ORDER — ATORVASTATIN CALCIUM 80 MG PO TABS: 80.0000 mg | ORAL_TABLET | Freq: Every day | ORAL | 0 refills | Status: AC

## 2015-12-07 MED ORDER — ATENOLOL 25 MG PO TABS: 12.5000 mg | ORAL_TABLET | Freq: Every day | ORAL | Status: DC

## 2015-12-07 MED ORDER — APIXABAN 5 MG PO TABS: 5.0000 mg | ORAL_TABLET | Freq: Two times a day (BID) | ORAL | 0 refills | Status: AC

## 2015-12-07 MED ORDER — INSULIN GLARGINE 100 UNIT/ML ~~LOC~~ SOLN
48.0000 [IU] | Freq: Every day | SUBCUTANEOUS | Status: DC
  Administered 2015-12-07: 48 [IU] via SUBCUTANEOUS
  Filled 2015-12-07 (×2): qty 0.48

## 2015-12-07 MED ORDER — CLOPIDOGREL BISULFATE 75 MG PO TABS: 75.0000 mg | ORAL_TABLET | Freq: Every day | ORAL | Status: DC

## 2015-12-07 NOTE — Progress Notes (Signed)
Pt is being discharged home today. PIV was removed. Discharge instructions reviewed with pt, all questions answered. Prescriptions were sent to pt pharmacy for pick up. Pt was provided with stroke handout. Will follow up with Cardiology in ~1 week as well as PCP. Home health PT was set up at discharge. He is leaving with all his belongings, will be transported home via family member.

## 2015-12-07 NOTE — Discharge Summary (Signed)
[facility] NAME: Sean Armstrong    MR#:  [id number]  DATE OF BIRTH:  [birth year]  DATE OF ADMISSION:  12/05/2015 ADMITTING PHYSICIAN: Lance Coon, MD  DATE OF DISCHARGE: 12/07/2015  PRIMARY CARE PHYSICIAN: Albina Billet, MD   ADMISSION DIAGNOSIS:  Weakness [R53.1] Hyperglycemia [R73.9] Spinal stenosis of lumbar region [M48.06] Cystitis [N30.90]  DISCHARGE DIAGNOSIS:  Principal Problem:   Left leg weakness Active Problems:   UTI (lower urinary tract infection)   Diabetes (HCC)   Anxiety   GERD (gastroesophageal reflux disease)   HLD (hyperlipidemia)   BPH (benign prostatic hyperplasia)   A-fib ([place])   SECONDARY DIAGNOSIS:   Past Medical History:  Diagnosis Date  . Anemia   . Colon polyp   . Coronary artery disease   . Diabetes mellitus without complication ([place])   . FHx: SVT (supraventricular tachycardia) 1998  . GERD (gastroesophageal reflux disease)   . History of DVT (deep vein thrombosis) 2009  . History of pulmonary embolism 08/2008  . Hyperlipidemia   . Hypertension   . Hypothyroidism   . Lumbar disc disease   . MGUS (monoclonal gammopathy of unknown significance)   . Myocardial infarction ([place])   . Nephrolithiasis   . Osteoarthritis   . Peptic ulcer disease   . Renal insufficiency   . Rheumatoid arthritis ([place])      ADMITTING HISTORY  Sean Armstrong  is a 75 y.o. male who presents with Progressive left lower extremity weakness. Patient states that he's had some general weakness for some time, but states that his left leg has acutely become weaker. His son is in the room with him and states that patient had a fall a couple of days ago, but that his left leg weakness began this morning. Workup here in the ED showed likely UTI, and x-ray of the lumbar spine showed significant degenerative changes. Patient denies anything like saddle anesthesia or difficulty controlling bowel movements. He has had some  urinary hesitancy, but states that he has had this for some time due to BPH.  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 - Start aspirin and atorvastatin. Eliquis as recommended by cardiology - Lovenox for DVT prophylaxis in the hospital - Patient worked with physical therapy and home health physical therapy is being set up. -  Appreciate neurology input  * Diabetes mellitus Resume home dose of insulin. Diabetic diet.  * UTI On antibiotics Cultures negative. Antibiotics stopped.  * new-onset atrial fibrillation, rate controlled Decreased patient's atenolol to 12.5 mg daily due to mild bradycardia. Started Eliquis. Appreciate cardiology input. Patient will follow-up as outpatient with them.  Stable for discharge home.  CONSULTS OBTAINED:  Treatment Team:  Catarina Hartshorn, MD Corey Skains, MD Alexis Goodell, MD  DRUG ALLERGIES:   Allergies  Allergen Reactions  . Gentamycin [Gentamicin] Other (See Comments)    Reaction:  Caused kidney issues for pt   . Iodinated Diagnostic Agents     Other reaction(s): Other (See Comments) "Burning sensation throughout whole body"  . Meloxicam     Other reaction(s): Other (See Comments) GI UPSET  . Penicillin V Potassium     Other reaction(s): Other (See Comments) No effect with ampicillin  . Celecoxib Nausea Only    Other reaction(s): Other (See Comments) GI upset  . Ciprofloxacin Nausea Only  . Hydrocodone Nausea Only  .  Propranolol Palpitations    DISCHARGE MEDICATIONS:   Current Discharge Medication List    START taking these medications   Details  apixaban (ELIQUIS) 5 MG TABS tablet Take 1 tablet (5 mg total) by mouth 2 (two) times daily. Qty: 60 tablet, Refills: 0    atorvastatin (LIPITOR) 80 MG tablet Take 1 tablet (80 mg total) by mouth daily at 6 PM. Qty: 30 tablet, Refills: 0       CONTINUE these medications which have CHANGED   Details  atenolol (TENORMIN) 25 MG tablet Take 0.5 tablets (12.5 mg total) by mouth daily. Qty: 30 tablet, Refills: 0      CONTINUE these medications which have NOT CHANGED   Details  aspirin EC 81 MG tablet Take 81 mg by mouth daily.    cetirizine (ZYRTEC) 10 MG tablet Take 10 mg by mouth daily.    fluticasone (FLONASE) 50 MCG/ACT nasal spray Place 2 sprays into both nostrils daily.    folic acid (FOLVITE) 1 MG tablet Take 1 mg by mouth daily.    gabapentin (NEURONTIN) 400 MG capsule Take 400 mg by mouth 2 (two) times daily.     glimepiride (AMARYL) 2 MG tablet Take 2 mg by mouth 2 (two) times daily.     hydroxychloroquine (PLAQUENIL) 200 MG tablet Take 200 mg by mouth daily.    Insulin Detemir (LEVEMIR FLEXTOUCH) 100 UNIT/ML Pen Inject 48 Units into the skin at bedtime.    loperamide (IMODIUM) 2 MG capsule Take 1 capsule (2 mg total) by mouth every 4 (four) hours as needed for diarrhea or loose stools. Qty: 20 capsule, Refills: 0    LORazepam (ATIVAN) 0.5 MG tablet Take 0.5 mg by mouth 2 (two) times daily as needed for anxiety.     methotrexate (RHEUMATREX) 2.5 MG tablet Take 12.5 mg by mouth once a week. Pt takes on Wednesday.  Caution:Chemotherapy. Protect from light.    Multiple Vitamins-Minerals (CENTRUM SILVER PO) Take 1 tablet by mouth daily.    Associated Diagnoses: MGUS (monoclonal gammopathy of unknown significance)    omeprazole (PRILOSEC) 20 MG capsule Take 40 mg by mouth daily.    potassium chloride SA (K-DUR,KLOR-CON) 20 MEQ tablet Take 40 mEq by mouth daily.     predniSONE (DELTASONE) 5 MG tablet Take 5 mg by mouth daily with breakfast.    ranitidine (ZANTAC) 150 MG tablet Take 150 mg by mouth 2 (two) times daily.    Associated Diagnoses: MGUS (monoclonal gammopathy of unknown significance)    sucralfate (CARAFATE) 1 g tablet Take 1 g by mouth 2 (two) times daily.    Associated Diagnoses: MGUS  (monoclonal gammopathy of unknown significance)    tamsulosin (FLOMAX) 0.4 MG CAPS capsule Take 0.4 mg by mouth daily after breakfast.     traMADol (ULTRAM) 50 MG tablet Take 50 mg by mouth every 6 (six) hours as needed.    Associated Diagnoses: MGUS (monoclonal gammopathy of unknown significance)    triamterene-hydrochlorothiazide (MAXZIDE) 75-50 MG tablet Take 1 tablet by mouth daily.      STOP taking these medications     simvastatin (ZOCOR) 20 MG tablet         Today   VITAL SIGNS:  Blood pressure 129/68, pulse (!) 57, temperature 98 F (36.7 C), temperature source Oral, resp. rate 18, height 5\' 8"  (1.727 m), weight 86.2 kg (190 lb), SpO2 100 %.  I/O:   Intake/Output Summary (Last 24 hours) at 12/07/15 1300 Last data filed at 12/07/15 0757  Gross per  24 hour  Intake              450 ml  Output             1250 ml  Net             -800 ml    PHYSICAL EXAMINATION:  Physical Exam  GENERAL:  75 y.o.-year-old patient lying in the bed with no acute distress.  LUNGS: Normal breath sounds bilaterally, no wheezing, rales,rhonchi or crepitation. No use of accessory muscles of respiration.  CARDIOVASCULAR: S1, S2 normal. No murmurs, rubs, or gallops.  ABDOMEN: Soft, non-tender, non-distended. Bowel sounds present. No organomegaly or mass.  NEUROLOGIC: Moves all 4 extremities. PSYCHIATRIC: The patient is alert and oriented x 3.  SKIN: No obvious rash, lesion, or ulcer.   DATA REVIEW:   CBC  Recent Labs Lab 12/06/15 0401  WBC 6.2  HGB 14.4  HCT 42.8  PLT 194    Chemistries   Recent Labs Lab 12/05/15 1809 12/06/15 0401  NA 136 136  K 4.3 4.6  CL 103 104  CO2 26 24  GLUCOSE 281* 315*  BUN 22* 24*  CREATININE 1.24 1.25*  CALCIUM 10.1 10.0  AST 25  --   ALT 24  --   ALKPHOS 44  --   BILITOT 0.2*  --     Cardiac Enzymes  Recent Labs Lab 12/05/15 1809  TROPONINI 0.03*    Microbiology Results  Results for orders placed or performed during the  hospital encounter of 12/05/15  Urine culture     Status: None (Preliminary result)   Collection Time: 12/05/15  6:09 PM  Result Value Ref Range Status   Specimen Description URINE, CLEAN CATCH  Final   Special Requests Normal  Final   Culture   Final    CULTURE REINCUBATED FOR BETTER GROWTH Performed at [facility]    Report Status PENDING  Incomplete    RADIOLOGY:  Dg Lumbar Spine Complete  Result Date: 12/05/2015 CLINICAL DATA:  Recent fall. EXAM: LUMBAR SPINE - COMPLETE 4+ VIEW COMPARISON:  None. FINDINGS: Five non-rib-bearing lumbar vertebral bodies. There is extensive osteophyte formation throughout the lumbar spine. No acute fracture or listhesis. There is narrowing of the intervertebral disc spaces from L3-L5 with associated facet arthrosis. IMPRESSION: Lower lumbar predominant degenerative disc disease and facet arthrosis. No acute fracture or listhesis. Electronically Signed   By: Ulyses Jarred M.D.   On: 12/05/2015 19:12   Ct Head Wo Contrast  Result Date: 12/05/2015 CLINICAL DATA:  Gradually worsening weakness for 6 months. Unable to bear weight on LEFT leg. History of diabetes, hypertension, Bell's palsy. EXAM: CT HEAD WITHOUT CONTRAST TECHNIQUE: Contiguous axial images were obtained from the base of the skull through the vertex without intravenous contrast. COMPARISON:  MRI of the head Jul 20, 2012 FINDINGS: BRAIN: The ventricles and sulci are normal for age. No intraparenchymal hemorrhage, mass effect nor midline shift. Patchy supratentorial white matter hypodensities less than expected for patient's age, though non-specific are most compatible with chronic small vessel ischemic disease. No acute large vascular territory infarcts. No abnormal extra-axial fluid collections. Basal cisterns are patent. VASCULAR: Moderate calcific atherosclerosis of the carotid siphons. SKULL: No skull fracture. No significant scalp soft tissue swelling. SINUSES/ORBITS: The mastoid air-cells and  included paranasal sinuses are well-aerated. Status post bilateral ocular lens implants. The included ocular globes and orbital contents are non-suspicious. OTHER: None. IMPRESSION: Negative CT HEAD for age. Electronically Signed   By: Sandie Ano  Bloomer M.D.   On: 12/05/2015 18:24   Mr Brain Wo Contrast  Result Date: 12/06/2015 CLINICAL DATA:  Facial droop and difficulty getting words out. Atrial fibrillation. EXAM: MRI HEAD WITHOUT CONTRAST TECHNIQUE: Multiplanar, multiecho pulse sequences of the brain and surrounding structures were obtained without intravenous contrast. 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 No superimposed hemorrhage. Remote lacunar infarcts in the right caudate head. White matter ischemic gliosis is mild. No hydrocephalus, mass, or unexpected atrophy. Vascular: Normal flow voids. Skull and upper cervical spine: Normal marrow signal. C2-3 facet arthropathy Sinuses/Orbits: Negative. Other: None. IMPRESSION: 1. 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 2. Remote lacunar infarcts in the right caudate. Electronically Signed   By: Monte Fantasia M.D.   On: 12/06/2015 14:14   Mr Lumbar Spine Wo Contrast  Result Date: 12/06/2015 CLINICAL DATA:  Left leg weakness EXAM: MRI LUMBAR SPINE WITHOUT CONTRAST TECHNIQUE: Multiplanar, multisequence MR imaging of the lumbar spine was performed. No intravenous contrast was administered. COMPARISON:  01/26/2013 FINDINGS: Segmentation:  Standard. Alignment:  Physiologic. Vertebrae: No fracture, evidence of discitis, or bone lesion. There is bulky spondylosis. On abdominal CT 06/18/2015 there were confluent osteophytes in the lower thoracic and upper lumbar spine compatible with diffuse idiopathic skeletal hyperostosis Conus medullaris: Extends to the L1-2 disc level and appears normal. Paraspinal and other soft tissues: Minimally seen abdominal aortic aneurysm, with no detectable change since  abdominal CT 06/18/2015. Renal cysts bilaterally. Disc levels: T12- L1: Small posterior disc protrusion. Ventral spondylosis. Negative facets. No impingement L1-L2: Spondylosis with broad central protrusion, noncompressive. Mild facet spurring. Mild canal stenosis and cauda equina crowding. L2-L3: Bulky ventral spondylosis.  No herniation.  No impingement L3-L4: Chronic disc protrusion which has diminished in size and become ossified. Bulky endplate spurring, especially right far-lateral. The right far-lateral L3 nerve is displaced by the endplate ridging, but does not appear compressed. Facet arthropathy with bony and ligamentous overgrowth. Canal stenosis remains advanced, with no visible CSF. Moderate bilateral foraminal stenosis. Mild interspinous edema. L4-L5: Chronic central disc protrusion with right more than left subarticular recess stenosis causing moderate impingement on the descending right L5 nerve. Disc herniation migrates superiorly to the mid L4 body level. Mild bilateral foraminal narrowing mainly from endplate ridging. Moderate facet arthropathy and spurring. Mild interspinous edema. L5-S1:Chronic calcified disc protrusion, right eccentric with mild mass effect on the descending S1 nerve. Facet arthropathy with bulky spurring. Synovial cysts present lateral to the left facet. Mild bilateral foraminal narrowing. IMPRESSION: 1. Multilevel disc and facet degeneration that is similar to 2014. Bulky osteophytes with lower thoracic ankylosis compatible with diffuse idiopathic skeletal hyperostosis. 2. L3-4 chronic disc herniation. Herniated disc has regressed and become more ossified compared to 2014, but there is still high-grade spinal stenosis. Moderate bilateral foraminal narrowing 3. L4-5 chronic, ossified disc protrusion with right L5 impingement in the subarticular recess. 4. L5-S1 chronic disc protrusion with mild right S1 mass-effect in the subarticular recess. Electronically Signed   By:  Monte Fantasia M.D.   On: 12/06/2015 14:09   US Carotid Bilateral  Result Date: 12/06/2015 CLINICAL DATA:  CVA EXAM: BILATERAL CAROTID DUPLEX ULTRASOUND TECHNIQUE: Pearline Cables scale imaging, color Doppler and duplex ultrasound were performed of bilateral carotid and vertebral arteries in the neck. COMPARISON:  None. FINDINGS: Criteria: Quantification of carotid stenosis is based on velocity parameters that correlate the residual internal carotid diameter with NASCET-based stenosis levels, using the diameter of the distal internal carotid lumen as  the denominator for stenosis measurement. The following velocity measurements were obtained: RIGHT ICA:  85 cm/sec CCA:  87 cm/sec SYSTOLIC ICA/CCA RATIO:  1.0 DIASTOLIC ICA/CCA RATIO:  2.0 ECA:  124 cm/sec LEFT ICA:  86 cm/sec CCA:  94 cm/sec SYSTOLIC ICA/CCA RATIO:  0.9 DIASTOLIC ICA/CCA RATIO:  1.9 ECA:  131 cm/sec RIGHT CAROTID ARTERY: Moderate calcified plaque in the bulb. Low resistance internal carotid Doppler pattern RIGHT VERTEBRAL ARTERY: Retrograde flow during systole. Poor diastolic flow. LEFT CAROTID ARTERY: Mild calcified plaque in the bulb. Low resistance internal carotid Doppler pattern. LEFT VERTEBRAL ARTERY:  Antegrade. IMPRESSION: Less than 50% stenosis in the right and left internal carotid arteries. There is retrograde flow in the right vertebral artery suggesting right subclavian steal syndrome. Significant stenosis at the origin of the right subclavian artery is not excluded. CT angiogram of the chest can be performed to further delineate. Electronically Signed   By: Marybelle Killings M.D.   On: 12/06/2015 16:23    Follow up with PCP in 1 week.  Management plans discussed with the patient, family and they are in agreement.  CODE STATUS:     Code Status Orders        Start     Ordered   12/05/15 2343  Full code  Continuous     12/05/15 2342    Code Status History    Date Active Date Inactive Code Status Order ID Comments User Context    12/05/2015 11:42 PM 12/06/2015 12:59 PM Full Code [id number]  Lance Coon, MD Inpatient   06/18/2015  5:01 PM 06/20/2015  7:52 PM Full Code [id number]  Epifanio Lesches, MD ED    Advance Directive Documentation   Flowsheet Row Most Recent Value  Type of Advance Directive  Healthcare Power of Attorney  Pre-existing out of facility DNR order (yellow form or pink MOST form)  No data  "MOST" Form in Place?  No data      TOTAL TIME TAKING CARE OF THIS PATIENT ON DAY OF DISCHARGE: more than 30 minutes.   Hillary Bow R M.D on 12/07/2015 at 1:00 PM  Between 7am to 6pm - Pager - [phone]  After 6pm go to [email or web address] - password [email or web address] [facility]  Office  [phone]  CC: Primary care physician; Albina Billet, MD  Note: This dictation was prepared with Dragon dictation along with smaller phrase technology. Any transcriptional errors that result from this process are unintentional.

## 2015-12-07 NOTE — Evaluation (Signed)
Physical Therapy Evaluation Patient Details Name: Sean Armstrong MRN: [id number] DOB: 10-Nov-1940 Today's Date: 12/07/2015   History of Present Illness  75 y.o. male who presents with Progressive left lower extremity weakness. Patient states that he's had some general weakness for some time, but states that his left leg has acutely become weaker.  Head imaging reveals acute infarct.  Clinical Impression  Pt did well with ambulation using walker and though he had some fatigue with the effort did much better than he expected.  Pt should not need rehab, but apparently his wife is limited so he will likely need increased assist from son for the near future.  He was also able to ambulate just holding the rail with relatively good confidence.  Pt did have some weakness in the L LE but overall appears to be recovering well.    Follow Up Recommendations Home health PT    Equipment Recommendations  Rolling walker with 5" wheels    Recommendations for Other Services       Precautions / Restrictions Precautions Precautions: Fall Restrictions Weight Bearing Restrictions: No      Mobility  Bed Mobility Overal bed mobility: Modified Independent             General bed mobility comments: Pt able to get in/out of bed w/o direct assist  Transfers Overall transfer level: Needs assistance Equipment used: Rolling walker (2 wheeled) Transfers: Sit to/from Stand Sit to Stand: Min guard         General transfer comment: Pt initially shows some hesitancy, but once up is able to trust L LE more than he expected and felt more confident  Ambulation/Gait Ambulation/Gait assistance: Min guard;Min assist Ambulation Distance (Feet): 100 Feet Assistive device: Rolling walker (2 wheeled)       General Gait Details: Pt with slow but relatively consistent cadence.  He did have some minimal L knee guarding/buckling that never made him require increased assist.  Pt able to do ~25 ft with holding  hallway rail on R w/o issue.  Pt surprised about how well he was able to do and feels much better with this time walking than previous efforts since CVA.  Stairs            Wheelchair Mobility    Modified Rankin (Stroke Patients Only)       Balance Overall balance assessment: Modified Independent                                           Pertinent Vitals/Pain      Home Living Family/patient expects to be discharged to:: Private residence Living Arrangements: Spouse/significant other Available Help at Discharge: Family (son is highly involved)   Home Access: Ramped entrance     Home Layout: One level Home Equipment: Cane - single point      Prior Function Level of Independence: Independent with assistive device(s)               Hand Dominance        Extremity/Trunk Assessment   Upper Extremity Assessment: Overall WFL for tasks assessed           Lower Extremity Assessment: LLE deficits/detail   LLE Deficits / Details: grossly 4/5 with minimally decreased control/quality of motion.  Pt reports it is moving much better than yesterday     Communication   Communication: No difficulties  Cognition  Arousal/Alertness: Awake/alert Behavior During Therapy: WFL for tasks assessed/performed Overall Cognitive Status: Within Functional Limits for tasks assessed                      General Comments      Exercises     Assessment/Plan    PT Assessment Patient needs continued PT services  PT Problem List Decreased strength;Decreased balance;Decreased activity tolerance;Decreased knowledge of use of DME;Decreased safety awareness;Decreased mobility          PT Treatment Interventions DME instruction;Gait training;Therapeutic activities;Therapeutic exercise;Functional mobility training;Balance training;Neuromuscular re-education;Patient/family education    PT Goals (Current goals can be found in the Care Plan section)   Acute Rehab PT Goals Patient Stated Goal: get back to walking with just a cane PT Goal Formulation: With patient Time For Goal Achievement: 12/21/15 Potential to Achieve Goals: Good    Frequency 7X/week   Barriers to discharge        Co-evaluation               End of Session Equipment Utilized During Treatment: Gait belt Activity Tolerance: Patient tolerated treatment well Patient left: with bed alarm set      Functional Assessment Tool Used: clinical judgement Functional Limitation: Mobility: Walking and moving around Mobility: Walking and Moving Around Current Status [id number]): At least 40 percent but less than 60 percent impaired, limited or restricted Mobility: Walking and Moving Around Goal Status [phone]): At least 1 percent but less than 20 percent impaired, limited or restricted    Time: 0850-0908 PT Time Calculation (min) (ACUTE ONLY): 18 min   Charges:   PT Evaluation $PT Eval Low Complexity: 1 Procedure     PT G Codes:   PT G-Codes **NOT FOR INPATIENT CLASS** Functional Assessment Tool Used: clinical judgement Functional Limitation: Mobility: Walking and moving around Mobility: Walking and Moving Around Current Status [id number]): At least 40 percent but less than 60 percent impaired, limited or restricted Mobility: Walking and Moving Around Goal Status [phone]): At least 1 percent but less than 20 percent impaired, limited or restricted    Kreg Shropshire, DPT 12/07/2015, 10:50 AM

## 2015-12-07 NOTE — Progress Notes (Signed)
[facility] Cardiology [facility] Encounter Note  Patient: Sean Armstrong / Admit Date: 12/05/2015 / Date of Encounter: 12/07/2015, 8:28 AM   Subjective: No more falling dizziness or presyncope symptoms. Mild shortness of breath and weakness. Left leg still weak  Review of Systems: Positive for: Leg weakness Negative for: Vision change, hearing change, syncope, dizziness, nausea, vomiting,diarrhea, bloody stool, stomach pain, cough, congestion, diaphoresis, urinary frequency, urinary pain,skin lesions, skin rashes Others previously listed  Objective: Telemetry: Atrial fibrillation with controlled ventricular rate Physical Exam: Blood pressure (!) 126/59, pulse (!) 48, temperature 98 F (36.7 C), temperature source Oral, resp. rate 20, height 5\' 8"  (1.727 m), weight 190 lb (86.2 kg), SpO2 100 %. Body mass index is 28.89 kg/m. General: Well developed, well nourished, in no acute distress. Head: Normocephalic, atraumatic, sclera non-icteric, no xanthomas, nares are without discharge. Neck: No apparent masses Lungs: Normal respirations with no wheezes, no rhonchi, no rales , no crackles   Heart: Irregular rate and rhythm, normal S1 S2, no murmur, no rub, no gallop, PMI is normal size and placement, carotid upstroke normal without bruit, jugular venous pressure normal Abdomen: Soft, non-tender, non-distended with normoactive bowel sounds. No hepatosplenomegaly. Abdominal aorta is normal size without bruit Extremities: Trace edema, no clubbing, no cyanosis, no ulcers,  Peripheral: 2+ radial, 2+ femoral, 2+ dorsal pedal pulses Neuro: Alert and oriented. Moves all extremities spontaneously. Psych:  Responds to questions appropriately with a normal affect.   Intake/Output Summary (Last 24 hours) at 12/07/15 0828 Last data filed at 12/07/15 0757  Gross per 24 hour  Intake              690 ml  Output             1850 ml  Net            -1160 ml    Inpatient Medications:  . aspirin EC  81  mg Oral Daily  . atenolol  12.5 mg Oral Daily  . cefTRIAXone (ROCEPHIN)  IV  2 g Intravenous Q24H  . enoxaparin (LOVENOX) injection  40 mg Subcutaneous Q24H  . gabapentin  400 mg Oral BID  . hydroxychloroquine  200 mg Oral Daily  . insulin aspart  0-5 Units Subcutaneous QHS  . insulin aspart  0-9 Units Subcutaneous TID WC  . insulin glargine  48 Units Subcutaneous Daily  . pantoprazole  40 mg Oral Daily  . predniSONE  5 mg Oral Q breakfast  . simvastatin  20 mg Oral QHS  . sodium chloride flush  3 mL Intravenous Q12H  . sucralfate  1 g Oral BID  . tamsulosin  0.4 mg Oral QPC breakfast   Infusions:    Labs:  Recent Labs  12/05/15 1809 12/06/15 0401  NA 136 136  K 4.3 4.6  CL 103 104  CO2 26 24  GLUCOSE 281* 315*  BUN 22* 24*  CREATININE 1.24 1.25*  CALCIUM 10.1 10.0    Recent Labs  12/05/15 1809  AST 25  ALT 24  ALKPHOS 44  BILITOT 0.2*  PROT 6.6  ALBUMIN 3.3*    Recent Labs  12/05/15 1809 12/06/15 0401  WBC 7.3 6.2  NEUTROABS 5.4  --   HGB 14.2 14.4  HCT 42.3 42.8  MCV 88.4 87.9  PLT 204 194    Recent Labs  12/05/15 1809  TROPONINI 0.03*   Invalid input(s): POCBNP  Recent Labs  12/06/15 0401  HGBA1C 8.6*     Weights: [employer]   12/05/15  1810  Weight: 190 lb (86.2 kg)     Radiology/Studies:  Dg Lumbar Spine Complete  Result Date: 12/05/2015 CLINICAL DATA:  Recent fall. EXAM: LUMBAR SPINE - COMPLETE 4+ VIEW COMPARISON:  None. FINDINGS: Five non-rib-bearing lumbar vertebral bodies. There is extensive osteophyte formation throughout the lumbar spine. No acute fracture or listhesis. There is narrowing of the intervertebral disc spaces from L3-L5 with associated facet arthrosis. IMPRESSION: Lower lumbar predominant degenerative disc disease and facet arthrosis. No acute fracture or listhesis. Electronically Signed   By: Ulyses Jarred M.D.   On: 12/05/2015 19:12   Ct Head Wo Contrast  Result Date: 12/05/2015 CLINICAL DATA:  Gradually  worsening weakness for 6 months. Unable to bear weight on LEFT leg. History of diabetes, hypertension, Bell's palsy. EXAM: CT HEAD WITHOUT CONTRAST TECHNIQUE: Contiguous axial images were obtained from the base of the skull through the vertex without intravenous contrast. COMPARISON:  MRI of the head Jul 20, 2012 FINDINGS: BRAIN: The ventricles and sulci are normal for age. No intraparenchymal hemorrhage, mass effect nor midline shift. Patchy supratentorial white matter hypodensities less than expected for patient's age, though non-specific are most compatible with chronic small vessel ischemic disease. No acute large vascular territory infarcts. No abnormal extra-axial fluid collections. Basal cisterns are patent. VASCULAR: Moderate calcific atherosclerosis of the carotid siphons. SKULL: No skull fracture. No significant scalp soft tissue swelling. SINUSES/ORBITS: The mastoid air-cells and included paranasal sinuses are well-aerated. Status post bilateral ocular lens implants. The included ocular globes and orbital contents are non-suspicious. OTHER: None. IMPRESSION: Negative CT HEAD for age. Electronically Signed   By: Elon Alas M.D.   On: 12/05/2015 18:24   Mr Brain Wo Contrast  Result Date: 12/06/2015 CLINICAL DATA:  Facial droop and difficulty getting words out. Atrial fibrillation. EXAM: MRI HEAD WITHOUT CONTRAST TECHNIQUE: Multiplanar, multiecho pulse sequences of the brain and surrounding structures were obtained without intravenous contrast. 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 No superimposed hemorrhage. Remote lacunar infarcts in the right caudate head. White matter ischemic gliosis is mild. No hydrocephalus, mass, or unexpected atrophy. Vascular: Normal flow voids. Skull and upper cervical spine: Normal marrow signal. C2-3 facet arthropathy Sinuses/Orbits: Negative. Other: None. IMPRESSION: 1. 7 mm acute infarct in the right centrum  semiovale. 2. Remote lacunar infarcts in the right caudate. Electronically Signed   By: Monte Fantasia M.D.   On: 12/06/2015 14:14   Mr Lumbar Spine Wo Contrast  Result Date: 12/06/2015 CLINICAL DATA:  Left leg weakness EXAM: MRI LUMBAR SPINE WITHOUT CONTRAST TECHNIQUE: Multiplanar, multisequence MR imaging of the lumbar spine was performed. No intravenous contrast was administered. COMPARISON:  01/26/2013 FINDINGS: Segmentation:  Standard. Alignment:  Physiologic. Vertebrae: No fracture, evidence of discitis, or bone lesion. There is bulky spondylosis. On abdominal CT 06/18/2015 there were confluent osteophytes in the lower thoracic and upper lumbar spine compatible with diffuse idiopathic skeletal hyperostosis Conus medullaris: Extends to the L1-2 disc level and appears normal. Paraspinal and other soft tissues: Minimally seen abdominal aortic aneurysm, with no detectable change since abdominal CT 06/18/2015. Renal cysts bilaterally. Disc levels: T12- L1: Small posterior disc protrusion. Ventral spondylosis. Negative facets. No impingement L1-L2: Spondylosis with broad central protrusion, noncompressive. Mild facet spurring. Mild canal stenosis and cauda equina crowding. L2-L3: Bulky ventral spondylosis.  No herniation.  No impingement L3-L4: Chronic disc protrusion which has diminished in size and become ossified. Bulky endplate spurring, especially right far-lateral. The right far-lateral L3 nerve is displaced by  the endplate ridging, but does not appear compressed. Facet arthropathy with bony and ligamentous overgrowth. Canal stenosis remains advanced, with no visible CSF. Moderate bilateral foraminal stenosis. Mild interspinous edema. L4-L5: Chronic central disc protrusion with right more than left subarticular recess stenosis causing moderate impingement on the descending right L5 nerve. Disc herniation migrates superiorly to the mid L4 body level. Mild bilateral foraminal narrowing mainly from endplate  ridging. Moderate facet arthropathy and spurring. Mild interspinous edema. L5-S1:Chronic calcified disc protrusion, right eccentric with mild mass effect on the descending S1 nerve. Facet arthropathy with bulky spurring. Synovial cysts present lateral to the left facet. Mild bilateral foraminal narrowing. IMPRESSION: 1. Multilevel disc and facet degeneration that is similar to 2014. Bulky osteophytes with lower thoracic ankylosis compatible with diffuse idiopathic skeletal hyperostosis. 2. L3-4 chronic disc herniation. Herniated disc has regressed and become more ossified compared to 2014, but there is still high-grade spinal stenosis. Moderate bilateral foraminal narrowing 3. L4-5 chronic, ossified disc protrusion with right L5 impingement in the subarticular recess. 4. L5-S1 chronic disc protrusion with mild right S1 mass-effect in the subarticular recess. Electronically Signed   By: Monte Fantasia M.D.   On: 12/06/2015 14:09   US Carotid Bilateral  Result Date: 12/06/2015 CLINICAL DATA:  CVA EXAM: BILATERAL CAROTID DUPLEX ULTRASOUND TECHNIQUE: Pearline Cables scale imaging, color Doppler and duplex ultrasound were performed of bilateral carotid and vertebral arteries in the neck. COMPARISON:  None. FINDINGS: Criteria: Quantification of carotid stenosis is based on velocity parameters that correlate the residual internal carotid diameter with NASCET-based stenosis levels, using the diameter of the distal internal carotid lumen as the denominator for stenosis measurement. The following velocity measurements were obtained: RIGHT ICA:  85 cm/sec CCA:  87 cm/sec SYSTOLIC ICA/CCA RATIO:  1.0 DIASTOLIC ICA/CCA RATIO:  2.0 ECA:  124 cm/sec LEFT ICA:  86 cm/sec CCA:  94 cm/sec SYSTOLIC ICA/CCA RATIO:  0.9 DIASTOLIC ICA/CCA RATIO:  1.9 ECA:  131 cm/sec RIGHT CAROTID ARTERY: Moderate calcified plaque in the bulb. Low resistance internal carotid Doppler pattern RIGHT VERTEBRAL ARTERY: Retrograde flow during systole. Poor diastolic  flow. LEFT CAROTID ARTERY: Mild calcified plaque in the bulb. Low resistance internal carotid Doppler pattern. LEFT VERTEBRAL ARTERY:  Antegrade. IMPRESSION: Less than 50% stenosis in the right and left internal carotid arteries. There is retrograde flow in the right vertebral artery suggesting right subclavian steal syndrome. Significant stenosis at the origin of the right subclavian artery is not excluded. CT angiogram of the chest can be performed to further delineate. Electronically Signed   By: Marybelle Killings M.D.   On: 12/06/2015 16:23     Assessment and Recommendation  75 y.o. male with episode of fall unclear whether this was true syncope at this time and/or stroke with some residual weakness of left leg and no current evidence of carotid stenosis but does have lacunar infarct and stroke considering cryptogenic in nature and/or from atrial fibrillation currently stable at this time without evidence of myocardial infarction 1. Begin ambulation and follow for heart rate control of atrial fibrillation and concerns that the patient may have significant symptomatic bradycardia causing his issues and would consider outpatient Holter monitor for reassessment X 2. Anticoagulation with L a quest 5 mg twice per day for further risk reduction in stroke with atrial fibrillation 3. High intensity cholesterol therapy for cardiovascular disease and peripheral vascular disease with simvastatin 4. No further cardiac diagnostics necessary at this time and possible discharged home and is ambulating well with follow-up in one to  2 weeks  Signed, Serafina Royals M.D. FACC

## 2015-12-07 NOTE — Care Management Note (Signed)
Case Management Note  Patient Details  Name: Sean Armstrong MRN: [id number] Date of Birth: 01/18/1941  Subjective/Objective:  RNCM assessment for discharge planning. MRI positive for stroke.  Met with patient at bedside. He prefers to use [facility] for home health. Referral to [facility] with [facility] for [facility]. No nursing needs identified. New Eliquis. Coupon given. Lives at home with wife who assists patient as needed.                   Action/Plan:   Expected Discharge Date:                  Expected Discharge Plan:  [place]  In-House Referral:     Discharge planning Services  CM Consult  Post Acute Care Choice:  Home Health Choice offered to:  Patient  DME Arranged:    DME Agency:     HH Arranged:  PT [place]:  [place]  Status of Service:  Completed, signed off  If discussed at [place] of Stay Meetings, dates discussed:    Additional Comments:  Jolly Mango, RN 12/07/2015, 9:57 AM

## 2015-12-07 NOTE — Progress Notes (Addendum)
Subjective: Patient unchanged.  Continued difficulty controlling LLE.    Objective: Current vital signs: BP (!) 151/76 (BP Location: Right Arm)   Pulse 68   Temp 98.4 F (36.9 C) (Oral)   Resp 17   Ht 5\' 8"  (1.727 m)   Wt 86.2 kg (190 lb)   SpO2 98%   BMI 28.89 kg/m  Vital signs in last 24 hours: Temp:  [97.5 F (36.4 C)-98.4 F (36.9 C)] 98.4 F (36.9 C) (09/22 1531) Pulse Rate:  [48-70] 68 (09/22 1531) Resp:  [17-20] 17 (09/22 1531) BP: (126-158)/(54-82) 151/76 (09/22 1531) SpO2:  [97 %-100 %] 98 % (09/22 1531)  Intake/Output from previous day: 09/21 0701 - 09/22 0700 In: 690 [P.O.:640; IV Piggyback:50] Out: 1600 [Urine:1600] Intake/Output this shift: Total I/O In: -  Out: 250 [Urine:250] Nutritional status: Diet heart healthy/carb modified Room service appropriate? Yes; Fluid consistency: Thin  Neurologic Exam: Mental Status: Alert, oriented, thought content appropriate.  Speech fluent without evidence of aphasia.  Able to follow 3 step commands without difficulty. Cranial Nerves: II: Discs flat bilaterally; Visual fields grossly normal, pupils equal, round, reactive to light and accommodation III,IV, VI: ptosis not present, extra-ocular motions intact bilaterally V,VII: left facial droop, facial light touch sensation normal bilaterally VIII: hearing normal bilaterally IX,X: gag reflex present XI: bilateral shoulder shrug XII: midline tongue extension Motor: Right :  Upper extremity   5/5                                      Left:     Upper extremity   5/5             Lower extremity   5/5                                                  Lower extremity   5/5 Left leg smaller than right  Lab Results: Basic Metabolic Panel:  Recent Labs Lab 12/05/15 1809 12/06/15 0401  NA 136 136  K 4.3 4.6  CL 103 104  CO2 26 24  GLUCOSE 281* 315*  BUN 22* 24*  CREATININE 1.24 1.25*  CALCIUM 10.1 10.0    Liver Function Tests:  Recent Labs Lab 12/05/15 1809   AST 25  ALT 24  ALKPHOS 44  BILITOT 0.2*  PROT 6.6  ALBUMIN 3.3*   No results for input(s): LIPASE, AMYLASE in the last 168 hours. No results for input(s): AMMONIA in the last 168 hours.  CBC:  Recent Labs Lab 12/05/15 1809 12/06/15 0401  WBC 7.3 6.2  NEUTROABS 5.4  --   HGB 14.2 14.4  HCT 42.3 42.8  MCV 88.4 87.9  PLT 204 194    Cardiac Enzymes:  Recent Labs Lab 12/05/15 1809  TROPONINI 0.03*    Lipid Panel: No results for input(s): CHOL, TRIG, HDL, CHOLHDL, VLDL, LDLCALC in the last 168 hours.  CBG:  Recent Labs Lab 12/06/15 1338 12/06/15 1631 12/06/15 2049 12/07/15 0727 12/07/15 1113  GLUCAP 309* 285* 255* 200* 271*    Microbiology: Results for orders placed or performed during the hospital encounter of 12/05/15  Urine culture     Status: None (Preliminary result)   Collection Time: 12/05/15  6:09 PM  Result Value Ref Range Status   Specimen  Description URINE, CLEAN CATCH  Final   Special Requests Normal  Final   Culture   Final    CULTURE REINCUBATED FOR BETTER GROWTH Performed at [facility]    Report Status PENDING  Incomplete    Coagulation Studies: No results for input(s): LABPROT, INR in the last 72 hours.  Imaging: Dg Lumbar Spine Complete  Result Date: 12/05/2015 CLINICAL DATA:  Recent fall. EXAM: LUMBAR SPINE - COMPLETE 4+ VIEW COMPARISON:  None. FINDINGS: Five non-rib-bearing lumbar vertebral bodies. There is extensive osteophyte formation throughout the lumbar spine. No acute fracture or listhesis. There is narrowing of the intervertebral disc spaces from L3-L5 with associated facet arthrosis. IMPRESSION: Lower lumbar predominant degenerative disc disease and facet arthrosis. No acute fracture or listhesis. Electronically Signed   By: Ulyses Jarred M.D.   On: 12/05/2015 19:12   Ct Head Wo Contrast  Result Date: 12/05/2015 CLINICAL DATA:  Gradually worsening weakness for 6 months. Unable to bear weight on LEFT leg. History  of diabetes, hypertension, Bell's palsy. EXAM: CT HEAD WITHOUT CONTRAST TECHNIQUE: Contiguous axial images were obtained from the base of the skull through the vertex without intravenous contrast. COMPARISON:  MRI of the head Jul 20, 2012 FINDINGS: BRAIN: The ventricles and sulci are normal for age. No intraparenchymal hemorrhage, mass effect nor midline shift. Patchy supratentorial white matter hypodensities less than expected for patient's age, though non-specific are most compatible with chronic small vessel ischemic disease. No acute large vascular territory infarcts. No abnormal extra-axial fluid collections. Basal cisterns are patent. VASCULAR: Moderate calcific atherosclerosis of the carotid siphons. SKULL: No skull fracture. No significant scalp soft tissue swelling. SINUSES/ORBITS: The mastoid air-cells and included paranasal sinuses are well-aerated. Status post bilateral ocular lens implants. The included ocular globes and orbital contents are non-suspicious. OTHER: None. IMPRESSION: Negative CT HEAD for age. Electronically Signed   By: Elon Alas M.D.   On: 12/05/2015 18:24   Mr Brain Wo Contrast  Result Date: 12/06/2015 CLINICAL DATA:  Facial droop and difficulty getting words out. Atrial fibrillation. EXAM: MRI HEAD WITHOUT CONTRAST TECHNIQUE: Multiplanar, multiecho pulse sequences of the brain and surrounding structures were obtained without intravenous contrast. 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 No superimposed hemorrhage. Remote lacunar infarcts in the right caudate head. White matter ischemic gliosis is mild. No hydrocephalus, mass, or unexpected atrophy. Vascular: Normal flow voids. Skull and upper cervical spine: Normal marrow signal. C2-3 facet arthropathy Sinuses/Orbits: Negative. Other: None. IMPRESSION: 1. 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 2. Remote lacunar infarcts in the right caudate. Electronically Signed    By: Monte Fantasia M.D.   On: 12/06/2015 14:14   Mr Lumbar Spine Wo Contrast  Result Date: 12/06/2015 CLINICAL DATA:  Left leg weakness EXAM: MRI LUMBAR SPINE WITHOUT CONTRAST TECHNIQUE: Multiplanar, multisequence MR imaging of the lumbar spine was performed. No intravenous contrast was administered. COMPARISON:  01/26/2013 FINDINGS: Segmentation:  Standard. Alignment:  Physiologic. Vertebrae: No fracture, evidence of discitis, or bone lesion. There is bulky spondylosis. On abdominal CT 06/18/2015 there were confluent osteophytes in the lower thoracic and upper lumbar spine compatible with diffuse idiopathic skeletal hyperostosis Conus medullaris: Extends to the L1-2 disc level and appears normal. Paraspinal and other soft tissues: Minimally seen abdominal aortic aneurysm, with no detectable change since abdominal CT 06/18/2015. Renal cysts bilaterally. Disc levels: T12- L1: Small posterior disc protrusion. Ventral spondylosis. Negative facets. No impingement L1-L2: Spondylosis with broad central protrusion, noncompressive. Mild  facet spurring. Mild canal stenosis and cauda equina crowding. L2-L3: Bulky ventral spondylosis.  No herniation.  No impingement L3-L4: Chronic disc protrusion which has diminished in size and become ossified. Bulky endplate spurring, especially right far-lateral. The right far-lateral L3 nerve is displaced by the endplate ridging, but does not appear compressed. Facet arthropathy with bony and ligamentous overgrowth. Canal stenosis remains advanced, with no visible CSF. Moderate bilateral foraminal stenosis. Mild interspinous edema. L4-L5: Chronic central disc protrusion with right more than left subarticular recess stenosis causing moderate impingement on the descending right L5 nerve. Disc herniation migrates superiorly to the mid L4 body level. Mild bilateral foraminal narrowing mainly from endplate ridging. Moderate facet arthropathy and spurring. Mild interspinous edema.  L5-S1:Chronic calcified disc protrusion, right eccentric with mild mass effect on the descending S1 nerve. Facet arthropathy with bulky spurring. Synovial cysts present lateral to the left facet. Mild bilateral foraminal narrowing. IMPRESSION: 1. Multilevel disc and facet degeneration that is similar to 2014. Bulky osteophytes with lower thoracic ankylosis compatible with diffuse idiopathic skeletal hyperostosis. 2. L3-4 chronic disc herniation. Herniated disc has regressed and become more ossified compared to 2014, but there is still high-grade spinal stenosis. Moderate bilateral foraminal narrowing 3. L4-5 chronic, ossified disc protrusion with right L5 impingement in the subarticular recess. 4. L5-S1 chronic disc protrusion with mild right S1 mass-effect in the subarticular recess. Electronically Signed   By: Monte Fantasia M.D.   On: 12/06/2015 14:09   US Carotid Bilateral  Result Date: 12/06/2015 CLINICAL DATA:  CVA EXAM: BILATERAL CAROTID DUPLEX ULTRASOUND TECHNIQUE: Pearline Cables scale imaging, color Doppler and duplex ultrasound were performed of bilateral carotid and vertebral arteries in the neck. COMPARISON:  None. FINDINGS: Criteria: Quantification of carotid stenosis is based on velocity parameters that correlate the residual internal carotid diameter with NASCET-based stenosis levels, using the diameter of the distal internal carotid lumen as the denominator for stenosis measurement. The following velocity measurements were obtained: RIGHT ICA:  85 cm/sec CCA:  87 cm/sec SYSTOLIC ICA/CCA RATIO:  1.0 DIASTOLIC ICA/CCA RATIO:  2.0 ECA:  124 cm/sec LEFT ICA:  86 cm/sec CCA:  94 cm/sec SYSTOLIC ICA/CCA RATIO:  0.9 DIASTOLIC ICA/CCA RATIO:  1.9 ECA:  131 cm/sec RIGHT CAROTID ARTERY: Moderate calcified plaque in the bulb. Low resistance internal carotid Doppler pattern RIGHT VERTEBRAL ARTERY: Retrograde flow during systole. Poor diastolic flow. LEFT CAROTID ARTERY: Mild calcified plaque in the bulb. Low  resistance internal carotid Doppler pattern. LEFT VERTEBRAL ARTERY:  Antegrade. IMPRESSION: Less than 50% stenosis in the right and left internal carotid arteries. There is retrograde flow in the right vertebral artery suggesting right subclavian steal syndrome. Significant stenosis at the origin of the right subclavian artery is not excluded. CT angiogram of the chest can be performed to further delineate. Electronically Signed   By: Marybelle Killings M.D.   On: 12/06/2015 16:23    Medications:  I have reviewed the patient's current medications. Scheduled: . aspirin EC  81 mg Oral Daily  . atenolol  12.5 mg Oral Daily  . cefTRIAXone (ROCEPHIN)  IV  2 g Intravenous Q24H  . enoxaparin (LOVENOX) injection  40 mg Subcutaneous Q24H  . gabapentin  400 mg Oral BID  . hydroxychloroquine  200 mg Oral Daily  . insulin aspart  0-5 Units Subcutaneous QHS  . insulin aspart  0-9 Units Subcutaneous TID WC  . insulin glargine  48 Units Subcutaneous Daily  . pantoprazole  40 mg Oral Daily  . predniSONE  5 mg Oral Q  breakfast  . simvastatin  20 mg Oral QHS  . sodium chloride flush  3 mL Intravenous Q12H  . sucralfate  1 g Oral BID  . tamsulosin  0.4 mg Oral QPC breakfast    Assessment/Plan: Symptoms continue.  MRI of the brain personally reviewed and shows a right centrum semiovale acute infarct.  Carotid dopplers show no evidence of hemodynamically significant stenosis in the carotid arteries.  Questionable evidence of right subclavian artery stenosis.  Echocardiogram pending.  A1c 8.6.  Patient with acute infarct in the setting of new onset afib.  Anticoagulation recommended.    Recommendations: 1.  Eliquis 2.  Lipid panel 3.  Counseling on blood sugar management 4.  Continued therapy 5.  Follow up with neurology on an outpatient basis   LOS: 1 day   Alexis Goodell, MD Neurology [phone] 12/07/2015  3:33 PM

## 2015-12-07 NOTE — Discharge Instructions (Addendum)
Diabetic diet    Activity as tolerated

## 2015-12-08 LAB — URINE CULTURE: Special Requests: NORMAL

## 2016-04-07 ENCOUNTER — Emergency Department
Admission: EM | Admit: 2016-04-07 | Discharge: 2016-04-07 | Disposition: A | Discharge status: Home / Self Care | Payer: Medicare Other | Attending: Emergency Medicine | Admitting: Emergency Medicine

## 2016-04-07 ENCOUNTER — Encounter: Payer: Self-pay | Admitting: *Deleted

## 2016-04-07 ENCOUNTER — Emergency Department: Payer: Medicare Other

## 2016-04-07 DIAGNOSIS — Z794 Long term (current) use of insulin: Secondary | ICD-10-CM | POA: Insufficient documentation

## 2016-04-07 DIAGNOSIS — Z79899 Other long term (current) drug therapy: Secondary | ICD-10-CM | POA: Insufficient documentation

## 2016-04-07 DIAGNOSIS — I10 Essential (primary) hypertension: Secondary | ICD-10-CM | POA: Diagnosis not present

## 2016-04-07 DIAGNOSIS — S0990XA Unspecified injury of head, initial encounter: Secondary | ICD-10-CM | POA: Diagnosis present

## 2016-04-07 DIAGNOSIS — Y999 Unspecified external cause status: Secondary | ICD-10-CM | POA: Diagnosis not present

## 2016-04-07 DIAGNOSIS — S0081XA Abrasion of other part of head, initial encounter: Secondary | ICD-10-CM

## 2016-04-07 DIAGNOSIS — F17220 Nicotine dependence, chewing tobacco, uncomplicated: Secondary | ICD-10-CM | POA: Diagnosis not present

## 2016-04-07 DIAGNOSIS — S0083XA Contusion of other part of head, initial encounter: Secondary | ICD-10-CM

## 2016-04-07 DIAGNOSIS — Z7982 Long term (current) use of aspirin: Secondary | ICD-10-CM | POA: Insufficient documentation

## 2016-04-07 DIAGNOSIS — E119 Type 2 diabetes mellitus without complications: Secondary | ICD-10-CM | POA: Diagnosis not present

## 2016-04-07 DIAGNOSIS — Y92009 Unspecified place in unspecified non-institutional (private) residence as the place of occurrence of the external cause: Secondary | ICD-10-CM | POA: Diagnosis not present

## 2016-04-07 DIAGNOSIS — E039 Hypothyroidism, unspecified: Secondary | ICD-10-CM | POA: Diagnosis not present

## 2016-04-07 DIAGNOSIS — Y93B9 Activity, other involving muscle strengthening exercises: Secondary | ICD-10-CM | POA: Insufficient documentation

## 2016-04-07 DIAGNOSIS — W01198A Fall on same level from slipping, tripping and stumbling with subsequent striking against other object, initial encounter: Secondary | ICD-10-CM | POA: Insufficient documentation

## 2016-04-07 DIAGNOSIS — I2510 Atherosclerotic heart disease of native coronary artery without angina pectoris: Secondary | ICD-10-CM | POA: Insufficient documentation

## 2016-04-07 DIAGNOSIS — W19XXXA Unspecified fall, initial encounter: Secondary | ICD-10-CM

## 2016-04-07 NOTE — ED Provider Notes (Signed)
[facility] Emergency Department Provider Note  ____________________________________________   First MD Initiated Contact with Patient 04/07/16 1357     (approximate)  I have reviewed the triage vital signs and the nursing notes.   HISTORY  Chief Complaint Fall   HPI Sean Armstrong is a 76 y.o. male is here with complaint of abrasion and blow to his forehead. Patient states that he tripped and fell while doing his exercises at home. He states that he has rehabilitation exercises that he does. He states he is tolerating time that he hits his head while taking out request he is to come to the emergency room. He states that his face hit the hearth at the fireplace. He denies any loss of consciousness. He was able to get up on his own. He denies any other injuries to his body. He denies any visual changes, nausea or vomiting. He has remained ambulatory since his fall.   Past Medical History:  Diagnosis Date  . Anemia   . Colon polyp   . Coronary artery disease   . Diabetes mellitus without complication ([place])   . FHx: SVT (supraventricular tachycardia) 1998  . GERD (gastroesophageal reflux disease)   . History of DVT (deep vein thrombosis) 2009  . History of pulmonary embolism 08/2008  . Hyperlipidemia   . Hypertension   . Hypothyroidism   . Lumbar disc disease   . MGUS (monoclonal gammopathy of unknown significance)   . Myocardial infarction   . Nephrolithiasis   . Osteoarthritis   . Peptic ulcer disease   . Renal insufficiency   . Rheumatoid arthritis [facility])     Patient Active Problem List   Diagnosis Date Noted  . Left leg weakness 12/05/2015  . UTI (lower urinary tract infection) 12/05/2015  . Diabetes ([place]) 12/05/2015  . Anxiety 12/05/2015  . GERD (gastroesophageal reflux disease) 12/05/2015  . HLD (hyperlipidemia) 12/05/2015  . BPH (benign prostatic hyperplasia) 12/05/2015  . A-fib ([place]) 12/05/2015  . Sepsis ([place]) 06/18/2015    Past  Surgical History:  Procedure Laterality Date  . CARDIAC CATHETERIZATION    . CHOLECYSTECTOMY    . COLONOSCOPY  03/2007, 2006  . CORONARY ARTERY BYPASS GRAFT    . ESOPHAGOGASTRODUODENOSCOPY (EGD) WITH PROPOFOL N/A 07/02/2015   Procedure: ESOPHAGOGASTRODUODENOSCOPY (EGD) WITH PROPOFOL;  Surgeon: Manya Silvas, MD;  Location: [facility] ENDOSCOPY;  Service: Endoscopy;  Laterality: N/A;  . EYE SURGERY    . JOINT REPLACEMENT    . REPLACEMENT TOTAL KNEE BILATERAL    . TOTAL HIP ARTHROPLASTY Right     Prior to Admission medications   Medication Sig Start Date End Date Taking? Authorizing Provider  apixaban (ELIQUIS) 5 MG TABS tablet Take 1 tablet (5 mg total) by mouth 2 (two) times daily. 12/07/15   Hillary Bow, MD  aspirin EC 81 MG tablet Take 81 mg by mouth daily.    Historical Provider, MD  atenolol (TENORMIN) 25 MG tablet Take 0.5 tablets (12.5 mg total) by mouth daily. 12/07/15   Hillary Bow, MD  atorvastatin (LIPITOR) 80 MG tablet Take 1 tablet (80 mg total) by mouth daily at 6 PM. 12/07/15   Hillary Bow, MD  cetirizine (ZYRTEC) 10 MG tablet Take 10 mg by mouth daily.    Historical Provider, MD  fluticasone (FLONASE) 50 MCG/ACT nasal spray Place 2 sprays into both nostrils daily.    Historical Provider, MD  folic acid (FOLVITE) 1 MG tablet Take 1 mg by mouth daily.    Historical Provider,  MD  gabapentin (NEURONTIN) 400 MG capsule Take 400 mg by mouth 2 (two) times daily.     Historical Provider, MD  glimepiride (AMARYL) 2 MG tablet Take 2 mg by mouth 2 (two) times daily.     Historical Provider, MD  hydroxychloroquine (PLAQUENIL) 200 MG tablet Take 200 mg by mouth daily.    Historical Provider, MD  Insulin Detemir (LEVEMIR FLEXTOUCH) 100 UNIT/ML Pen Inject 48 Units into the skin at bedtime.    Historical Provider, MD  loperamide (IMODIUM) 2 MG capsule Take 1 capsule (2 mg total) by mouth every 4 (four) hours as needed for diarrhea or loose stools. 06/20/15   Srikar Sudini, MD  LORazepam  (ATIVAN) 0.5 MG tablet Take 0.5 mg by mouth 2 (two) times daily as needed for anxiety.     Historical Provider, MD  methotrexate (RHEUMATREX) 2.5 MG tablet Take 12.5 mg by mouth once a week. Pt takes on Wednesday.  Caution:Chemotherapy. Protect from light.    Historical Provider, MD  Multiple Vitamins-Minerals (CENTRUM SILVER PO) Take 1 tablet by mouth daily.     Historical Provider, MD  omeprazole (PRILOSEC) 20 MG capsule Take 40 mg by mouth daily.    Historical Provider, MD  potassium chloride SA (K-DUR,KLOR-CON) 20 MEQ tablet Take 40 mEq by mouth daily.     Historical Provider, MD  predniSONE (DELTASONE) 5 MG tablet Take 5 mg by mouth daily with breakfast.    Historical Provider, MD  ranitidine (ZANTAC) 150 MG tablet Take 150 mg by mouth 2 (two) times daily.  05/07/15   Historical Provider, MD  sucralfate (CARAFATE) 1 g tablet Take 1 g by mouth 2 (two) times daily.  06/05/15 06/04/16  Historical Provider, MD  tamsulosin (FLOMAX) 0.4 MG CAPS capsule Take 0.4 mg by mouth daily after breakfast.     Historical Provider, MD  traMADol (ULTRAM) 50 MG tablet Take 50 mg by mouth every 6 (six) hours as needed.  01/22/15   Historical Provider, MD  triamterene-hydrochlorothiazide (MAXZIDE) 75-50 MG tablet Take 1 tablet by mouth daily.    Historical Provider, MD    Allergies Gentamycin [gentamicin]; Iodinated diagnostic agents; Meloxicam; Penicillin v potassium; Celecoxib; Ciprofloxacin; Hydrocodone; and Propranolol  Family History  Problem Relation Age of Onset  . Heart attack Father   . Diabetes Father   . Heart attack Brother     Social History Social History  Substance Use Topics  . Smoking status: Former Smoker    Types: Cigarettes  . Smokeless tobacco: Current User    Types: Chew     Comment: quit smoking cigerates 1995  . Alcohol use No    Review of Systems Constitutional: No fever/chills Eyes: No visual changes. ENT: No trauma Cardiovascular: Denies chest pain. Respiratory: Denies  shortness of breath. Gastrointestinal: No abdominal pain.  No nausea, no vomiting.  Musculoskeletal: Negative for back pain. Skin: Superficial abrasions right forehead. Neurological: Negative for headaches, focal weakness or numbness.  10-point ROS otherwise negative.  ____________________________________________   PHYSICAL EXAM:  VITAL SIGNS: ED Triage Vitals  Enc Vitals Group     BP 04/07/16 1336 137/86     Pulse Rate 04/07/16 1336 95     Resp 04/07/16 1336 18     Temp 04/07/16 1336 99.2 F (37.3 C)     Temp Source 04/07/16 1336 Oral     SpO2 04/07/16 1336 95 %     Weight 04/07/16 1335 190 lb (86.2 kg)     Height 04/07/16 1335 5\' 8"  (1.727 m)  Head Circumference --      Peak Flow --      Pain Score --      Pain Loc --      Pain Edu? --      Excl. in [place]? --     Constitutional: Alert and oriented. Well appearing and in no acute distress.Answers questions appropriately. Eyes: Conjunctivae are normal. PERRL. EOMI. Head: Atraumatic. There is some minimal tenderness on palpation of the forehead on the right side. There is minimal soft tissue swelling. There is superficial abrasions without any active bleeding. No foreign body was noted. Nose: No congestion/rhinnorhea. Mouth/Throat: Mucous membranes are moist.  Oropharynx non-erythematous. Neck: No stridor.  No cervical tenderness on palpation posteriorly.  Cardiovascular: Normal rate, regular rhythm. Grossly normal heart sounds.  Good peripheral circulation. Respiratory: Normal respiratory effort.  No retractions. Lungs CTAB. Gastrointestinal: Soft and nontender. No distention.  Musculoskeletal: Moves upper and lower extremities without any difficulty. Nontender hips and knees to palpation. Patient is able to move lower extremities without any difficulty. Neurologic:  Normal speech and language. No gross focal neurologic deficits are appreciated. No gait instability. Skin:  Skin is warm, dry. Skin of the forehead is  discussed above. No other areas of abrasion or ecchymosis. Psychiatric: Mood and affect are normal. Speech and behavior are normal.  ____________________________________________   LABS (all labs ordered are listed, but only abnormal results are displayed)  Labs Reviewed - No data to display  RADIOLOGY CT head and cervical spine without contrast: IMPRESSION:  1. No acute intracranial findings are acute cervical spine findings.  2. Cervical spondylosis and degenerative disc disease believed to be  causing moderate impingement at C5-6 ; mild to moderate impingement  at C6-7 ; and mild impingement at C2-3, C3-4, and C4-5, as detailed  above.  3. Periventricular white matter and corona radiata hypodensities  favor chronic ischemic microvascular white matter disease.  4. Atherosclerotic calcification of the cavernous carotid arteries  and of the carotid bulbs bilaterally.       ____________________________________________   PROCEDURES  Procedure(s) performed: None  Procedures  Critical Care performed: No  ____________________________________________   INITIAL IMPRESSION / ASSESSMENT AND PLAN / ED COURSE  Pertinent labs & imaging results that were available during my care of the patient were reviewed by me and considered in my medical decision making (see chart for details).  Patient was reassured that he did not have a head injury. Patient was given instructions to watch area for signs of infection. He is to clean his forehead daily with mild soap and water. He will follow-up with his primary care doctor if any continued problems. He was instructed to continue his normal medications. He may also take Tylenol if needed for pain. He'll return immediately to the emergency room if any severe or sudden changes in his condition.    ___________________________________________   FINAL CLINICAL IMPRESSION(S) / ED DIAGNOSES  Final diagnoses:  Facial contusion, initial encounter   Facial abrasion, initial encounter  Fall in home, initial encounter      NEW MEDICATIONS STARTED DURING THIS VISIT:  Discharge Medication List as of 04/07/2016  2:56 PM       Note:  This document was prepared using Dragon voice recognition software and may include unintentional dictation errors.    Johnn Hai, PA-C 04/07/16 [place], MD 04/08/16 [phone]

## 2016-04-07 NOTE — ED Triage Notes (Signed)
States he tripped and fell at rehab while during his exercises, arrives with abrasion to forehead, pt on eliquis

## 2016-04-07 NOTE — Discharge Instructions (Signed)
Follow-up with your primary care doctor if any continued problems. Watch abrasions for signs of infection. Clean area daily with mild soap and water. Return to the emergency room immediately if any significant change in personality, vision, nausea or vomiting.

## 2016-04-07 NOTE — ED Notes (Signed)
See triage note  States he tripped   [place] face forward  Abrasion noted to forehead  No loc

## 2016-06-12 ENCOUNTER — Other Ambulatory Visit: Payer: Self-pay | Admitting: Nurse Practitioner

## 2016-06-12 DIAGNOSIS — R1084 Generalized abdominal pain: Secondary | ICD-10-CM

## 2016-06-16 ENCOUNTER — Encounter: Payer: Self-pay | Admitting: Emergency Medicine

## 2016-06-16 ENCOUNTER — Emergency Department
Admission: EM | Admit: 2016-06-16 | Discharge: 2016-06-16 | Disposition: A | Discharge status: Home / Self Care | Payer: Medicare Other | Attending: Emergency Medicine | Admitting: Emergency Medicine

## 2016-06-16 ENCOUNTER — Emergency Department: Payer: Medicare Other

## 2016-06-16 DIAGNOSIS — I252 Old myocardial infarction: Secondary | ICD-10-CM | POA: Diagnosis not present

## 2016-06-16 DIAGNOSIS — I82531 Chronic embolism and thrombosis of right popliteal vein: Secondary | ICD-10-CM | POA: Diagnosis not present

## 2016-06-16 DIAGNOSIS — Z794 Long term (current) use of insulin: Secondary | ICD-10-CM | POA: Insufficient documentation

## 2016-06-16 DIAGNOSIS — Z7982 Long term (current) use of aspirin: Secondary | ICD-10-CM | POA: Insufficient documentation

## 2016-06-16 DIAGNOSIS — I10 Essential (primary) hypertension: Secondary | ICD-10-CM | POA: Diagnosis not present

## 2016-06-16 DIAGNOSIS — Z951 Presence of aortocoronary bypass graft: Secondary | ICD-10-CM | POA: Diagnosis not present

## 2016-06-16 DIAGNOSIS — I2510 Atherosclerotic heart disease of native coronary artery without angina pectoris: Secondary | ICD-10-CM | POA: Insufficient documentation

## 2016-06-16 DIAGNOSIS — E039 Hypothyroidism, unspecified: Secondary | ICD-10-CM | POA: Insufficient documentation

## 2016-06-16 DIAGNOSIS — Z79899 Other long term (current) drug therapy: Secondary | ICD-10-CM | POA: Diagnosis not present

## 2016-06-16 DIAGNOSIS — M7989 Other specified soft tissue disorders: Secondary | ICD-10-CM | POA: Diagnosis present

## 2016-06-16 DIAGNOSIS — F17220 Nicotine dependence, chewing tobacco, uncomplicated: Secondary | ICD-10-CM | POA: Insufficient documentation

## 2016-06-16 DIAGNOSIS — E119 Type 2 diabetes mellitus without complications: Secondary | ICD-10-CM | POA: Insufficient documentation

## 2016-06-16 DIAGNOSIS — R609 Edema, unspecified: Secondary | ICD-10-CM

## 2016-06-16 LAB — CBC
HEMATOCRIT: 39.9 % — AB (ref 40.0–52.0)
HEMOGLOBIN: 13.7 g/dL (ref 13.0–18.0)
MCH: 30.7 pg (ref 26.0–34.0)
MCHC: 34.2 g/dL (ref 32.0–36.0)
MCV: 89.8 fL (ref 80.0–100.0)
Platelets: 193 10*3/uL (ref 150–440)
RBC: 4.45 MIL/uL (ref 4.40–5.90)
RDW: 16.2 % — ABNORMAL HIGH (ref 11.5–14.5)
WBC: 9.2 10*3/uL (ref 3.8–10.6)

## 2016-06-16 LAB — COMPREHENSIVE METABOLIC PANEL
ALBUMIN: 3.6 g/dL (ref 3.5–5.0)
ALT: 9 U/L — AB (ref 17–63)
AST: 27 U/L (ref 15–41)
Alkaline Phosphatase: 38 U/L (ref 38–126)
Anion gap: 6 (ref 5–15)
BUN: 16 mg/dL (ref 6–20)
CHLORIDE: 103 mmol/L (ref 101–111)
CO2: 28 mmol/L (ref 22–32)
Calcium: 10.1 mg/dL (ref 8.9–10.3)
Creatinine, Ser: 1.1 mg/dL (ref 0.61–1.24)
GFR calc Af Amer: >60 mL/min (ref >60)
GFR calc non Af Amer: >60 mL/min (ref >60)
GLUCOSE: 249 mg/dL — AB (ref 65–99)
POTASSIUM: 4.2 mmol/L (ref 3.5–5.1)
SODIUM: 137 mmol/L (ref 135–145)
TOTAL PROTEIN: 6.8 g/dL (ref 6.5–8.1)
Total Bilirubin: 0.7 mg/dL (ref 0.3–1.2)

## 2016-06-16 NOTE — ED Notes (Signed)
Discussed discharge instructions, prescriptions, and follow-up care with patient. No questions or concerns at this time. Pt stable at discharge.  

## 2016-06-16 NOTE — ED Triage Notes (Signed)
Pt with RLE swelling and pain since yesterday.

## 2016-06-16 NOTE — ED Notes (Signed)
Pt reports swelling in his right calf and ankle x 2 days. Pt states he has never had any leg swelling before. He reports pain in right foot and ankle. NAD noted.

## 2016-06-16 NOTE — ED Provider Notes (Signed)
[facility] Emergency Department Provider Note   ____________________________________________    I have reviewed the triage vital signs and the nursing notes.   HISTORY  Chief Complaint Leg Swelling     HPI Sean Armstrong is a 76 y.o. male who presents with complaints of leg swelling, now resolved. Patient reports over the last couple of days he reports swelling in his right lower leg. Denies injury to the area. Does report a history of blood clots in the distant past secondary to knee surgery. Also reports a history of PEs, he is on eliquis for a stroke which he started approximately 6 months ago. No shortness of breath. No chest pain   Past Medical History:  Diagnosis Date  . Anemia   . Colon polyp   . Coronary artery disease   . Diabetes mellitus without complication ([place])   . FHx: SVT (supraventricular tachycardia) 1998  . GERD (gastroesophageal reflux disease)   . History of DVT (deep vein thrombosis) 2009  . History of pulmonary embolism 08/2008  . Hyperlipidemia   . Hypertension   . Hypothyroidism   . Lumbar disc disease   . MGUS (monoclonal gammopathy of unknown significance)   . Myocardial infarction   . Nephrolithiasis   . Osteoarthritis   . Peptic ulcer disease   . Renal insufficiency   . Rheumatoid arthritis [facility])     Patient Active Problem List   Diagnosis Date Noted  . Left leg weakness 12/05/2015  . UTI (lower urinary tract infection) 12/05/2015  . Diabetes ([place]) 12/05/2015  . Anxiety 12/05/2015  . GERD (gastroesophageal reflux disease) 12/05/2015  . HLD (hyperlipidemia) 12/05/2015  . BPH (benign prostatic hyperplasia) 12/05/2015  . A-fib ([place]) 12/05/2015  . Sepsis ([place]) 06/18/2015    Past Surgical History:  Procedure Laterality Date  . CARDIAC CATHETERIZATION    . CHOLECYSTECTOMY    . COLONOSCOPY  03/2007, 2006  . CORONARY ARTERY BYPASS GRAFT    . ESOPHAGOGASTRODUODENOSCOPY (EGD) WITH PROPOFOL N/A 07/02/2015   Procedure: ESOPHAGOGASTRODUODENOSCOPY (EGD) WITH PROPOFOL;  Surgeon: Manya Silvas, MD;  Location: [facility] ENDOSCOPY;  Service: Endoscopy;  Laterality: N/A;  . EYE SURGERY    . JOINT REPLACEMENT    . REPLACEMENT TOTAL KNEE BILATERAL    . TOTAL HIP ARTHROPLASTY Right     Prior to Admission medications   Medication Sig Start Date End Date Taking? Authorizing Provider  apixaban (ELIQUIS) 5 MG TABS tablet Take 1 tablet (5 mg total) by mouth 2 (two) times daily. 12/07/15   Hillary Bow, MD  aspirin EC 81 MG tablet Take 81 mg by mouth daily.    Historical Provider, MD  atenolol (TENORMIN) 25 MG tablet Take 0.5 tablets (12.5 mg total) by mouth daily. 12/07/15   Hillary Bow, MD  atorvastatin (LIPITOR) 80 MG tablet Take 1 tablet (80 mg total) by mouth daily at 6 PM. 12/07/15   Hillary Bow, MD  cetirizine (ZYRTEC) 10 MG tablet Take 10 mg by mouth daily.    Historical Provider, MD  fluticasone (FLONASE) 50 MCG/ACT nasal spray Place 2 sprays into both nostrils daily.    Historical Provider, MD  folic acid (FOLVITE) 1 MG tablet Take 1 mg by mouth daily.    Historical Provider, MD  gabapentin (NEURONTIN) 400 MG capsule Take 400 mg by mouth 2 (two) times daily.     Historical Provider, MD  glimepiride (AMARYL) 2 MG tablet Take 2 mg by mouth 2 (two) times daily.     Historical  Provider, MD  hydroxychloroquine (PLAQUENIL) 200 MG tablet Take 200 mg by mouth daily.    Historical Provider, MD  Insulin Detemir (LEVEMIR FLEXTOUCH) 100 UNIT/ML Pen Inject 48 Units into the skin at bedtime.    Historical Provider, MD  loperamide (IMODIUM) 2 MG capsule Take 1 capsule (2 mg total) by mouth every 4 (four) hours as needed for diarrhea or loose stools. 06/20/15   Srikar Sudini, MD  LORazepam (ATIVAN) 0.5 MG tablet Take 0.5 mg by mouth 2 (two) times daily as needed for anxiety.     Historical Provider, MD  methotrexate (RHEUMATREX) 2.5 MG tablet Take 12.5 mg by mouth once a week. Pt takes on Wednesday.    Caution:Chemotherapy. Protect from light.    Historical Provider, MD  Multiple Vitamins-Minerals (CENTRUM SILVER PO) Take 1 tablet by mouth daily.     Historical Provider, MD  omeprazole (PRILOSEC) 20 MG capsule Take 40 mg by mouth daily.    Historical Provider, MD  potassium chloride SA (K-DUR,KLOR-CON) 20 MEQ tablet Take 40 mEq by mouth daily.     Historical Provider, MD  predniSONE (DELTASONE) 5 MG tablet Take 5 mg by mouth daily with breakfast.    Historical Provider, MD  ranitidine (ZANTAC) 150 MG tablet Take 150 mg by mouth 2 (two) times daily.  05/07/15   Historical Provider, MD  sucralfate (CARAFATE) 1 g tablet Take 1 g by mouth 2 (two) times daily.  06/05/15 06/04/16  Historical Provider, MD  tamsulosin (FLOMAX) 0.4 MG CAPS capsule Take 0.4 mg by mouth daily after breakfast.     Historical Provider, MD  traMADol (ULTRAM) 50 MG tablet Take 50 mg by mouth every 6 (six) hours as needed.  01/22/15   Historical Provider, MD  triamterene-hydrochlorothiazide (MAXZIDE) 75-50 MG tablet Take 1 tablet by mouth daily.    Historical Provider, MD     Allergies Gentamycin [gentamicin]; Iodinated diagnostic agents; Meloxicam; Penicillin v potassium; Celecoxib; Ciprofloxacin; Hydrocodone; and Propranolol  Family History  Problem Relation Age of Onset  . Heart attack Father   . Diabetes Father   . Heart attack Brother     Social History Social History  Substance Use Topics  . Smoking status: Former Smoker    Types: Cigarettes  . Smokeless tobacco: Current User    Types: Chew     Comment: quit smoking cigerates 1995  . Alcohol use No    Review of Systems  Constitutional: No fever/chills  Cardiovascular: Denies chest pain. Respiratory: Denies shortness of breath. Gastrointestinal: No abdominal pain.  No nausea, no vomiting.   Genitourinary: Negative for dysuria. Musculoskeletal: Pain as above Skin: Negative for rash. Neurological: Negative for new weakness  10-point ROS otherwise  negative.  ____________________________________________   PHYSICAL EXAM:  VITAL SIGNS: ED Triage Vitals  Enc Vitals Group     BP 06/16/16 1139 (!) 147/84     Pulse Rate 06/16/16 1139 (!) 50     Resp 06/16/16 1139 20     Temp 06/16/16 1142 98.2 F (36.8 C)     Temp Source 06/16/16 1142 Oral     SpO2 06/16/16 1139 100 %     Weight 06/16/16 1140 195 lb (88.5 kg)     Height 06/16/16 1140 5\' 9"  (1.753 m)     Head Circumference --      Peak Flow --      Pain Score --      Pain Loc --      Pain Edu? --  Excl. in [place]? --     Constitutional: Alert and oriented. No acute distress. Pleasant and interactive  Nose: No congestion/rhinnorhea. Mouth/Throat: Mucous membranes are moist.    Cardiovascular: Normal rate, regular rhythm. Grossly normal heart sounds.  Good peripheral circulation. Respiratory: Normal respiratory effort.  No retractions. Lungs CTAB. Gastrointestinal: Soft and nontender. No distention.  No CVA tenderness. Genitourinary: deferred Musculoskeletal: Normal exam, no edema, warm and well perfused lower extremities Neurologic:  Normal speech and language. No gross focal neurologic deficits are appreciated.  Skin:  Skin is warm, dry and intact. No rash noted. Psychiatric: Mood and affect are normal. Speech and behavior are normal.  ____________________________________________   LABS (all labs ordered are listed, but only abnormal results are displayed)  Labs Reviewed  COMPREHENSIVE METABOLIC PANEL - Abnormal; Notable for the following:       Result Value   Glucose, Bld 249 (*)    ALT 9 (*)    All other components within normal limits  CBC - Abnormal; Notable for the following:    HCT 39.9 (*)    RDW 16.2 (*)    All other components within normal limits   ____________________________________________  EKG  None ____________________________________________  RADIOLOGY  Nonocclusive DVT right popliteal  vein ____________________________________________   PROCEDURES  Procedure(s) performed: No    Critical Care performed: No ____________________________________________   INITIAL IMPRESSION / ASSESSMENT AND PLAN / ED COURSE  Pertinent labs & imaging results that were available during my care of the patient were reviewed by me and considered in my medical decision making (see chart for details).  Discussed with Dr. Lucky Cowboy vascular surgery, he feels this is very likely a chronic DVT and does not think this is necessarily a eliquis failure. He will see the patient in his office for reevaluation and re-ultrasound. Discussed with patient who feels this is quite reasonable. He does not want to stay in the hospital under any circumstances. 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    Lavonia Drafts, MD 06/16/16 [phone]

## 2016-06-18 ENCOUNTER — Ambulatory Visit (INDEPENDENT_AMBULATORY_CARE_PROVIDER_SITE_OTHER): Payer: Medicare Other | Admitting: Vascular Surgery

## 2016-06-18 DIAGNOSIS — E118 Type 2 diabetes mellitus with unspecified complications: Secondary | ICD-10-CM | POA: Diagnosis not present

## 2016-06-18 DIAGNOSIS — E785 Hyperlipidemia, unspecified: Secondary | ICD-10-CM

## 2016-06-18 DIAGNOSIS — I82431 Acute embolism and thrombosis of right popliteal vein: Secondary | ICD-10-CM

## 2016-06-18 NOTE — Progress Notes (Signed)
Subjective:    Patient ID: Sean Armstrong, male    DOB: [birth year], 76 y.o.   MRN: [id number] Chief Complaint  Patient presents with  . Re-evaluation    New DVT found   Patient known to our practice. He presents today with a chief complaint of a "right lower extremity DVT". He was seen in the ED on 06/16/16 complaining of right lower extremity pain and swelling. He endorses a history of DVT s/p knee surgery, PE and a stroke approximately six months ago for which he takes Eliquis 5mg  PO BID for. He was found to have a nonocclusive thrombus in the popliteal vein. Patient states he has been taking his Eliquis. He goes to the pain clinic and has oxycodone for his right lower extremity discomfort. He denies any SOB or chest pain. He denies any fever, nausea or vomiting.      Review of Systems  Constitutional: Negative.   HENT: Negative.   Eyes: Negative.   Respiratory: Negative.   Cardiovascular: Positive for leg swelling.  Gastrointestinal: Negative.   Endocrine: Negative.   Genitourinary: Negative.   Musculoskeletal: Negative.   Skin: Negative.   Allergic/Immunologic: Negative.   Neurological: Negative.   Hematological:       DVT  Psychiatric/Behavioral: Negative.        Objective:   Physical Exam  Constitutional: He is oriented to person, place, and time. He appears well-developed and well-nourished.  Uses walker.   HENT:  Head: Normocephalic and atraumatic.  Eyes: Conjunctivae are normal. Pupils are equal, round, and reactive to light.  Neck: Normal range of motion.  Cardiovascular: Normal rate, regular rhythm, normal heart sounds and intact distal pulses.   Pulses:      Radial pulses are 2+ on the right side, and 2+ on the left side.       Dorsalis pedis pulses are 2+ on the right side, and 2+ on the left side.       Posterior tibial pulses are 2+ on the right side, and 2+ on the left side.  Pulmonary/Chest: Effort normal.  Musculoskeletal: Normal range of motion. He  exhibits edema (Mild right lower extremity edema. No acute vascular compromise to the extremity. ).  No pain with palpation to the right lower extremity. No pain with dorsiflexion.   Neurological: He is alert and oriented to person, place, and time.  Skin: Skin is warm and dry. No erythema.  Psychiatric: He has a normal mood and affect. His behavior is normal. Judgment and thought content normal.   There were no vitals taken for this visit.  Past Medical History:  Diagnosis Date  . Anemia   . Colon polyp   . Coronary artery disease   . Diabetes mellitus without complication ([place])   . FHx: SVT (supraventricular tachycardia) 1998  . GERD (gastroesophageal reflux disease)   . History of DVT (deep vein thrombosis) 2009  . History of pulmonary embolism 08/2008  . Hyperlipidemia   . Hypertension   . Hypothyroidism   . Lumbar disc disease   . MGUS (monoclonal gammopathy of unknown significance)   . Myocardial infarction   . Nephrolithiasis   . Osteoarthritis   . Peptic ulcer disease   . Renal insufficiency   . Rheumatoid arthritis [facility])    Social History   Social History  . Marital status: Married    Spouse name: N/A  . Number of children: N/A  . Years of education: N/A   Occupational History  . Not on file.  Social History Main Topics  . Smoking status: Former Smoker    Types: Cigarettes  . Smokeless tobacco: Current User    Types: Chew     Comment: quit smoking cigerates 1995  . Alcohol use No  . Drug use: No  . Sexual activity: Not on file   Other Topics Concern  . Not on file   Social History Narrative  . No narrative on file   Past Surgical History:  Procedure Laterality Date  . CARDIAC CATHETERIZATION    . CHOLECYSTECTOMY    . COLONOSCOPY  03/2007, 2006  . CORONARY ARTERY BYPASS GRAFT    . ESOPHAGOGASTRODUODENOSCOPY (EGD) WITH PROPOFOL N/A 07/02/2015   Procedure: ESOPHAGOGASTRODUODENOSCOPY (EGD) WITH PROPOFOL;  Surgeon: Manya Silvas, MD;  Location:  [facility] ENDOSCOPY;  Service: Endoscopy;  Laterality: N/A;  . EYE SURGERY    . JOINT REPLACEMENT    . REPLACEMENT TOTAL KNEE BILATERAL    . TOTAL HIP ARTHROPLASTY Right    Family History  Problem Relation Age of Onset  . Heart attack Father   . Diabetes Father   . Heart attack Brother    Allergies  Allergen Reactions  . Gentamycin [Gentamicin] Other (See Comments)    Reaction:  Caused kidney issues for pt   . Iodinated Diagnostic Agents     Other reaction(s): Other (See Comments) "Burning sensation throughout whole body"  . Meloxicam     Other reaction(s): Other (See Comments) GI UPSET  . Metrizamide     Other reaction(s): Other (See Comments) "Burning sensation throughout whole body"  . Penicillin V Potassium     Other reaction(s): Other (See Comments) No effect with ampicillin  . Celecoxib Nausea Only    Other reaction(s): Other (See Comments) GI upset  . Ciprofloxacin Nausea Only  . Hydrocodone Nausea Only  . Propranolol Palpitations      Assessment & Plan:  Patient known to our practice. He presents today with a chief complaint of a "right lower extremity DVT". He was seen in the ED on 06/16/16 complaining of right lower extremity pain and swelling. He endorses a history of DVT s/p knee surgery, PE and a stroke approximately six months ago for which he takes Eliquis 5mg  PO BID for. He was found to have a nonocclusive thrombus in the popliteal vein. Patient states he has been taking his Eliquis. He goes to the pain clinic and has oxycodone for his right lower extremity discomfort. He denies any SOB or chest pain. He denies any fever, nausea or vomiting.    1. Deep vein thrombosis (DVT) of popliteal vein of right lower extremity, unspecified chronicity (HCC) - New DVT found in RLE popliteal vein.  Non-occlusive. Mild symptoms.  No intervention indicated at this time.  Unsure if acute or chronic as this is not addressed in ultrasound report.  I would recommend hematology  consult for non-provoked DVT while on Eliquis.  Patient to follow up in one week to assure if any propagation. Patient to seek medical attention immediately   - VAS [place] LOWER EXTREMITY VENOUS (DVT); Future  2. Type 2 diabetes mellitus with complication, unspecified long term insulin use status (HCC) - Stable Encouraged good control as its slows the progression of atherosclerotic disease  3. Hyperlipidemia, unspecified hyperlipidemia type - Stable Encouraged good control as its slows the progression of atherosclerotic disease  Current Outpatient Prescriptions on File Prior to Visit  Medication Sig Dispense Refill  . apixaban (ELIQUIS) 5 MG TABS tablet Take 1 tablet (5 mg total)  by mouth 2 (two) times daily. 60 tablet 0  . aspirin EC 81 MG tablet Take 81 mg by mouth daily.    Marland Kitchen atenolol (TENORMIN) 25 MG tablet Take 0.5 tablets (12.5 mg total) by mouth daily. 30 tablet 0  . atorvastatin (LIPITOR) 80 MG tablet Take 1 tablet (80 mg total) by mouth daily at 6 PM. 30 tablet 0  . cetirizine (ZYRTEC) 10 MG tablet Take 10 mg by mouth daily.    . fluticasone (FLONASE) 50 MCG/ACT nasal spray Place 2 sprays into both nostrils daily.    . folic acid (FOLVITE) 1 MG tablet Take 1 mg by mouth daily.    Marland Kitchen gabapentin (NEURONTIN) 400 MG capsule Take 400 mg by mouth 2 (two) times daily.     Marland Kitchen glimepiride (AMARYL) 2 MG tablet Take 2 mg by mouth 2 (two) times daily.     . hydroxychloroquine (PLAQUENIL) 200 MG tablet Take 200 mg by mouth daily.    . Insulin Detemir (LEVEMIR FLEXTOUCH) 100 UNIT/ML Pen Inject 48 Units into the skin at bedtime.    Marland Kitchen loperamide (IMODIUM) 2 MG capsule Take 1 capsule (2 mg total) by mouth every 4 (four) hours as needed for diarrhea or loose stools. 20 capsule 0  . LORazepam (ATIVAN) 0.5 MG tablet Take 0.5 mg by mouth 2 (two) times daily as needed for anxiety.     . methotrexate (RHEUMATREX) 2.5 MG tablet Take 12.5 mg by mouth once a week. Pt takes on Wednesday.  Caution:Chemotherapy.  Protect from light.    . Multiple Vitamins-Minerals (CENTRUM SILVER PO) Take 1 tablet by mouth daily.     Marland Kitchen omeprazole (PRILOSEC) 20 MG capsule Take 40 mg by mouth daily.    . potassium chloride SA (K-DUR,KLOR-CON) 20 MEQ tablet Take 40 mEq by mouth daily.     . predniSONE (DELTASONE) 5 MG tablet Take 5 mg by mouth daily with breakfast.    . ranitidine (ZANTAC) 150 MG tablet Take 150 mg by mouth 2 (two) times daily.     . tamsulosin (FLOMAX) 0.4 MG CAPS capsule Take 0.4 mg by mouth daily after breakfast.     . traMADol (ULTRAM) 50 MG tablet Take 50 mg by mouth every 6 (six) hours as needed.     . triamterene-hydrochlorothiazide (MAXZIDE) 75-50 MG tablet Take 1 tablet by mouth daily.    . sucralfate (CARAFATE) 1 g tablet Take 1 g by mouth 2 (two) times daily.      No current facility-administered medications on file prior to visit.     There are no Patient Instructions on file for this visit. No Follow-up on file.   Kenza Munar A Olga Bourbeau, PA-C

## 2016-06-20 ENCOUNTER — Ambulatory Visit
Admission: RE | Admit: 2016-06-20 | Discharge: 2016-06-20 | Disposition: A | Discharge status: Home / Self Care | Payer: Medicare Other | Source: Ambulatory Visit | Attending: Nurse Practitioner | Admitting: Nurse Practitioner

## 2016-06-20 DIAGNOSIS — I714 Abdominal aortic aneurysm, without rupture: Secondary | ICD-10-CM | POA: Diagnosis not present

## 2016-06-20 DIAGNOSIS — N200 Calculus of kidney: Secondary | ICD-10-CM | POA: Insufficient documentation

## 2016-06-20 DIAGNOSIS — N400 Enlarged prostate without lower urinary tract symptoms: Secondary | ICD-10-CM | POA: Insufficient documentation

## 2016-06-20 DIAGNOSIS — R1084 Generalized abdominal pain: Secondary | ICD-10-CM

## 2016-06-27 ENCOUNTER — Encounter (INDEPENDENT_AMBULATORY_CARE_PROVIDER_SITE_OTHER): Payer: Self-pay | Admitting: Vascular Surgery

## 2016-06-27 ENCOUNTER — Ambulatory Visit (INDEPENDENT_AMBULATORY_CARE_PROVIDER_SITE_OTHER): Payer: Medicare Other | Admitting: Vascular Surgery

## 2016-06-27 ENCOUNTER — Ambulatory Visit (INDEPENDENT_AMBULATORY_CARE_PROVIDER_SITE_OTHER): Payer: Medicare Other

## 2016-06-27 VITALS — BP 126/71 | HR 62 | Resp 17 | Wt 197.0 lb

## 2016-06-27 DIAGNOSIS — I82431 Acute embolism and thrombosis of right popliteal vein: Secondary | ICD-10-CM

## 2016-06-27 DIAGNOSIS — E785 Hyperlipidemia, unspecified: Secondary | ICD-10-CM

## 2016-06-27 DIAGNOSIS — E118 Type 2 diabetes mellitus with unspecified complications: Secondary | ICD-10-CM

## 2016-07-21 ENCOUNTER — Telehealth (INDEPENDENT_AMBULATORY_CARE_PROVIDER_SITE_OTHER): Payer: Self-pay

## 2016-07-21 ENCOUNTER — Other Ambulatory Visit (INDEPENDENT_AMBULATORY_CARE_PROVIDER_SITE_OTHER): Payer: Medicare Other

## 2016-07-21 ENCOUNTER — Other Ambulatory Visit (INDEPENDENT_AMBULATORY_CARE_PROVIDER_SITE_OTHER): Payer: Self-pay | Admitting: Vascular Surgery

## 2016-07-21 DIAGNOSIS — M7989 Other specified soft tissue disorders: Secondary | ICD-10-CM | POA: Diagnosis not present

## 2016-07-21 DIAGNOSIS — I82531 Chronic embolism and thrombosis of right popliteal vein: Secondary | ICD-10-CM

## 2016-07-21 NOTE — Telephone Encounter (Signed)
If we don't have any time for a STAT DVT today in our office he should be seen by the ED to assess if any new DVT has developed.

## 2016-07-21 NOTE — Telephone Encounter (Signed)
Patient has a u/s today for dvt.

## 2016-07-21 NOTE — Telephone Encounter (Signed)
Patients wife called stating the patient's feet are swollen, he was diagnosed with a dvt on his right leg 06/27/16, the wife states it is both feet.

## 2016-07-28 ENCOUNTER — Inpatient Hospital Stay: Payer: Medicare Other | Attending: Hematology and Oncology

## 2016-08-04 ENCOUNTER — Inpatient Hospital Stay: Payer: Medicare Other | Admitting: Internal Medicine

## 2016-08-07 ENCOUNTER — Encounter: Payer: Self-pay | Admitting: Emergency Medicine

## 2016-08-07 ENCOUNTER — Emergency Department: Payer: Medicare Other

## 2016-08-07 ENCOUNTER — Emergency Department
Admission: EM | Admit: 2016-08-07 | Discharge: 2016-08-07 | Disposition: A | Discharge status: Home / Self Care | Payer: Medicare Other | Attending: Emergency Medicine | Admitting: Emergency Medicine

## 2016-08-07 DIAGNOSIS — Y999 Unspecified external cause status: Secondary | ICD-10-CM | POA: Insufficient documentation

## 2016-08-07 DIAGNOSIS — K5900 Constipation, unspecified: Secondary | ICD-10-CM | POA: Insufficient documentation

## 2016-08-07 DIAGNOSIS — I10 Essential (primary) hypertension: Secondary | ICD-10-CM | POA: Diagnosis not present

## 2016-08-07 DIAGNOSIS — Y929 Unspecified place or not applicable: Secondary | ICD-10-CM | POA: Diagnosis not present

## 2016-08-07 DIAGNOSIS — E119 Type 2 diabetes mellitus without complications: Secondary | ICD-10-CM | POA: Diagnosis not present

## 2016-08-07 DIAGNOSIS — W19XXXA Unspecified fall, initial encounter: Secondary | ICD-10-CM

## 2016-08-07 DIAGNOSIS — Z7901 Long term (current) use of anticoagulants: Secondary | ICD-10-CM | POA: Diagnosis not present

## 2016-08-07 DIAGNOSIS — Z87891 Personal history of nicotine dependence: Secondary | ICD-10-CM | POA: Insufficient documentation

## 2016-08-07 DIAGNOSIS — R1031 Right lower quadrant pain: Secondary | ICD-10-CM | POA: Diagnosis present

## 2016-08-07 DIAGNOSIS — R109 Unspecified abdominal pain: Secondary | ICD-10-CM

## 2016-08-07 DIAGNOSIS — S300XXA Contusion of lower back and pelvis, initial encounter: Secondary | ICD-10-CM | POA: Diagnosis not present

## 2016-08-07 DIAGNOSIS — Z7982 Long term (current) use of aspirin: Secondary | ICD-10-CM | POA: Insufficient documentation

## 2016-08-07 DIAGNOSIS — Y939 Activity, unspecified: Secondary | ICD-10-CM | POA: Diagnosis not present

## 2016-08-07 DIAGNOSIS — Z951 Presence of aortocoronary bypass graft: Secondary | ICD-10-CM | POA: Diagnosis not present

## 2016-08-07 DIAGNOSIS — E039 Hypothyroidism, unspecified: Secondary | ICD-10-CM | POA: Diagnosis not present

## 2016-08-07 DIAGNOSIS — Y92009 Unspecified place in unspecified non-institutional (private) residence as the place of occurrence of the external cause: Secondary | ICD-10-CM

## 2016-08-07 DIAGNOSIS — Z96653 Presence of artificial knee joint, bilateral: Secondary | ICD-10-CM | POA: Diagnosis not present

## 2016-08-07 DIAGNOSIS — Z79899 Other long term (current) drug therapy: Secondary | ICD-10-CM | POA: Diagnosis not present

## 2016-08-07 DIAGNOSIS — W01190A Fall on same level from slipping, tripping and stumbling with subsequent striking against furniture, initial encounter: Secondary | ICD-10-CM | POA: Insufficient documentation

## 2016-08-07 MED ORDER — OXYCODONE-ACETAMINOPHEN 5-325 MG PO TABS
1.0000 | ORAL_TABLET | Freq: Once | ORAL | Status: AC
  Administered 2016-08-07: 1 via ORAL
  Filled 2016-08-07: qty 1

## 2016-08-07 MED ORDER — POLYETHYLENE GLYCOL 3350 17 GM/SCOOP PO POWD: ORAL | 0 refills | Status: DC

## 2016-08-07 MED ORDER — SENNOSIDES-DOCUSATE SODIUM 8.6-50 MG PO TABS: 2.0000 | ORAL_TABLET | Freq: Two times a day (BID) | ORAL | 0 refills | Status: DC

## 2016-08-07 NOTE — ED Provider Notes (Signed)
[facility] Emergency Department Provider Note  ____________________________________________  Time seen: Approximately 1:47 PM  I have reviewed the triage vital signs and the nursing notes.   HISTORY  Chief Complaint Fall    HPI Sean Armstrong is a 76 y.o. male who complains of right flank pain after a fall. 2 days ago he was trying to walk with his walker while also carrying a basket of) when he ran into the corner of the footboard of his bed. He fell sideways onto the foot board hitting his back across it. Denies head injury or loss of consciousness. No numbness tingling or weakness. He's had continuing pain in the lower back in the right flank since then. There has been constant, moderate intensity, worse with movement, no alleviating factors. Associated with pain in the right chest wall as well. No paresthesias or limb weakness   No vomiting. He has been constipated for the last 3 days. Eating and drinking normally.  Comply with his medical therapy including Eliquis. Denies shortness of breath.     Past Medical History:  Diagnosis Date  . Anemia   . Colon polyp   . Coronary artery disease   . Diabetes mellitus without complication ([place])   . FHx: SVT (supraventricular tachycardia) 1998  . GERD (gastroesophageal reflux disease)   . History of DVT (deep vein thrombosis) 2009  . History of pulmonary embolism 08/2008  . Hyperlipidemia   . Hypertension   . Hypothyroidism   . Lumbar disc disease   . MGUS (monoclonal gammopathy of unknown significance)   . Myocardial infarction ([place])   . Nephrolithiasis   . Osteoarthritis   . Peptic ulcer disease   . Renal insufficiency   . Rheumatoid arthritis [facility])      Patient Active Problem List   Diagnosis Date Noted  . Deep vein thrombosis (DVT) of popliteal vein of right lower extremity ([place]) 06/18/2016  . Left leg weakness 12/05/2015  . UTI (lower urinary tract infection) 12/05/2015  . Diabetes ([place])  12/05/2015  . Anxiety 12/05/2015  . GERD (gastroesophageal reflux disease) 12/05/2015  . HLD (hyperlipidemia) 12/05/2015  . BPH (benign prostatic hyperplasia) 12/05/2015  . A-fib ([place]) 12/05/2015  . Sepsis ([place]) 06/18/2015     Past Surgical History:  Procedure Laterality Date  . CARDIAC CATHETERIZATION    . CHOLECYSTECTOMY    . COLONOSCOPY  03/2007, 2006  . CORONARY ARTERY BYPASS GRAFT    . ESOPHAGOGASTRODUODENOSCOPY (EGD) WITH PROPOFOL N/A 07/02/2015   Procedure: ESOPHAGOGASTRODUODENOSCOPY (EGD) WITH PROPOFOL;  Surgeon: Manya Silvas, MD;  Location: [facility] ENDOSCOPY;  Service: Endoscopy;  Laterality: N/A;  . EYE SURGERY    . JOINT REPLACEMENT    . REPLACEMENT TOTAL KNEE BILATERAL    . TOTAL HIP ARTHROPLASTY Right      Prior to Admission medications   Medication Sig Start Date End Date Taking? Authorizing Provider  apixaban (ELIQUIS) 5 MG TABS tablet Take 1 tablet (5 mg total) by mouth 2 (two) times daily. 12/07/15   Hillary Bow, MD  aspirin EC 81 MG tablet Take 81 mg by mouth daily.    [provider]  atenolol (TENORMIN) 25 MG tablet Take 0.5 tablets (12.5 mg total) by mouth daily. 12/07/15   Hillary Bow, MD  atorvastatin (LIPITOR) 80 MG tablet Take 1 tablet (80 mg total) by mouth daily at 6 PM. 12/07/15   Sudini, Alveta Heimlich, MD  carbidopa-levodopa (PARCOPA) 25-100 MG disintegrating tablet Take by mouth.    [provider]  cetirizine (  ZYRTEC) 10 MG tablet Take 10 mg by mouth daily.    [provider]  esomeprazole (NEXIUM) 20 MG capsule Take 20 mg by mouth.    [provider]  fluticasone (FLONASE) 50 MCG/ACT nasal spray Place 2 sprays into both nostrils daily.    [provider]  folic acid (FOLVITE) 1 MG tablet Take 1 mg by mouth daily.    [provider]  gabapentin (NEURONTIN) 400 MG capsule Take 400 mg by mouth 2 (two) times daily.     [provider]  glimepiride (AMARYL) 2 MG tablet Take 2 mg by mouth 2 (two)  times daily.     [provider]  hydroxychloroquine (PLAQUENIL) 200 MG tablet Take 200 mg by mouth daily.    [provider]  Insulin Detemir (LEVEMIR FLEXTOUCH) 100 UNIT/ML Pen Inject 48 Units into the skin at bedtime.    [provider]  loperamide (IMODIUM) 2 MG capsule Take 1 capsule (2 mg total) by mouth every 4 (four) hours as needed for diarrhea or loose stools. 06/20/15   Hillary Bow, MD  LORazepam (ATIVAN) 0.5 MG tablet Take 0.5 mg by mouth 2 (two) times daily as needed for anxiety.     [provider]  methotrexate (RHEUMATREX) 2.5 MG tablet Take 12.5 mg by mouth once a week. Pt takes on Wednesday.  Caution:Chemotherapy. Protect from light.    [provider]  Multiple Vitamins-Minerals (CENTRUM SILVER PO) Take 1 tablet by mouth daily.     [provider]  omeprazole (PRILOSEC) 20 MG capsule Take 40 mg by mouth daily.    [provider]  ondansetron (ZOFRAN) 4 MG tablet  06/06/16   [provider]  oxyCODONE-acetaminophen (PERCOCET/ROXICET) 5-325 MG tablet  06/05/16   [provider]  polyethylene glycol powder (GLYCOLAX/MIRALAX) powder 2 cap fulls in a full glass of water, three times a day, for 5 days. 08/07/16   Carrie Mew, MD  potassium chloride SA (K-DUR,KLOR-CON) 20 MEQ tablet Take 40 mEq by mouth daily.     [provider]  predniSONE (DELTASONE) 5 MG tablet Take 5 mg by mouth daily with breakfast.    [provider]  ranitidine (ZANTAC) 150 MG tablet Take 150 mg by mouth 2 (two) times daily.  05/07/15   [provider]  senna-docusate (SENOKOT-S) 8.6-50 MG tablet Take 2 tablets by mouth 2 (two) times daily. 08/07/16   Carrie Mew, MD  simvastatin (ZOCOR) 20 MG tablet  09/27/14   [provider]  sucralfate (CARAFATE) 1 g tablet Take 1 g by mouth 2 (two) times daily.  06/05/15 06/04/16  [provider]  tamsulosin (FLOMAX) 0.4 MG CAPS capsule Take 0.4  mg by mouth daily after breakfast.     [provider]  traMADol (ULTRAM) 50 MG tablet Take 50 mg by mouth every 6 (six) hours as needed.  01/22/15   [provider]  triamterene-hydrochlorothiazide (MAXZIDE) 75-50 MG tablet Take 1 tablet by mouth daily.    [provider]     Allergies Gentamycin [gentamicin]; Iodinated diagnostic agents; Meloxicam; Metrizamide; Penicillin v potassium; Celecoxib; Ciprofloxacin; Hydrocodone; and Propranolol   Family History  Problem Relation Age of Onset  . Heart attack Father   . Diabetes Father   . Heart attack Brother     Social History Social History  Substance Use Topics  . Smoking status: Former Smoker    Types: Cigarettes  . Smokeless tobacco: Current User    Types: Chew  Comment: quit smoking cigerates 1995  . Alcohol use No    Review of Systems  Constitutional:   No fever or chills.  ENT:   No sore throat. No rhinorrhea. Cardiovascular:   Right chest wall pain as above Respiratory:   No dyspnea or cough. Gastrointestinal:   Right flank pain as above with constipation. No diarrhea..  Musculoskeletal:   Right hip pain All other systems reviewed and are negative except as documented above in ROS and HPI.  ____________________________________________   PHYSICAL EXAM:  VITAL SIGNS: ED Triage Vitals  Enc Vitals Group     BP 08/07/16 1117 (!) 150/62     Pulse Rate 08/07/16 1117 61     Resp 08/07/16 1117 18     Temp 08/07/16 1117 98.4 F (36.9 C)     Temp Source 08/07/16 1117 Oral     SpO2 08/07/16 1117 100 %     Weight 08/07/16 1118 195 lb (88.5 kg)     Height 08/07/16 1118 5\' 9"  (1.753 m)     Head Circumference --      Peak Flow --      Pain Score 08/07/16 1129 10     Pain Loc --      Pain Edu? --      Excl. in [place]? --     Vital signs reviewed, nursing assessments reviewed.   Constitutional:   Alert and oriented. Well appearing and in no distress. Eyes:   No scleral icterus. No  conjunctival pallor. PERRL. EOMI.  No nystagmus. ENT   Head:   Normocephalic and atraumatic.   Nose:   No congestion/rhinnorhea.    Mouth/Throat:   MMM, no pharyngeal erythema. No peritonsillar mass.    Neck:   No meningismus. Full ROM. No midline spinal tenderness. Hematological/Lymphatic/Immunilogical:   No cervical lymphadenopathy. Cardiovascular:   RRR. Symmetric bilateral radial and DP pulses.  No murmurs.  Respiratory:   Normal respiratory effort without tachypnea/retractions. Breath sounds are clear and equal bilaterally. No wheezes/rales/rhonchi. Gastrointestinal:   Soft with generalized tenderness. Mildly distended.. There is no CVA tenderness.  No rebound, rigidity, or guarding. Genitourinary:   deferred Musculoskeletal:   Normal range of motion in all extremities. No joint effusions.  No lower extremity tenderness.  No edema. There is tenderness across the lower back where he has a linear bruise horizontally. Neurologic:   Normal speech and language.  CN 2-10 normal. Motor grossly intact. No gross focal neurologic deficits are appreciated.  Skin:    Skin is warm, dry and intact. No rash noted.  No petechiae, purpura, or bullae. No periumbilical or flank bruising  ____________________________________________    LABS (pertinent positives/negatives) (all labs ordered are listed, but only abnormal results are displayed) Labs Reviewed - No data to display ____________________________________________   EKG    ____________________________________________    RADIOLOGY  Dg Ribs Unilateral W/chest Right  Result Date: 08/07/2016 CLINICAL DATA:  Fall at home 3 days ago. Right chest pain. Initial encounter. EXAM: RIGHT RIBS AND CHEST - 3+ VIEW COMPARISON:  Chest radiograph 06/18/2015 FINDINGS: The cardiac silhouette appears mildly enlarged, accentuated by AP technique and mildly shallow inspiration. Aortic atherosclerosis is noted. No airspace consolidation, edema,  pleural effusion, or pneumothorax is identified. Right upper quadrant abdominal surgical clips and thoracic spondylosis are noted. No rib fracture is identified. IMPRESSION: No rib fracture identified. Electronically Signed   By: Logan Bores M.D.   On: 08/07/2016 13:10   Dg Abd 2 Views  Result Date: 08/07/2016 CLINICAL  DATA:  Abdominal pain. Constipation. Back pain after a fall 2 days ago. EXAM: ABDOMEN - 2 VIEW COMPARISON:  CT abdomen and pelvis 06/20/2016 FINDINGS: Cholecystectomy clips are noted. There is no evidence of intraperitoneal free air. A moderate amount of stool is present in the colon. No dilated loops of bowel are seen to suggest obstruction. Thoracolumbar spondylosis and prior right hip arthroplasty are noted. IMPRESSION: Moderate volume of colonic stool without evidence of obstruction. Electronically Signed   By: Logan Bores M.D.   On: 08/07/2016 14:38   Dg Hip Unilat  With Pelvis 2-3 Views Right  Result Date: 08/07/2016 CLINICAL DATA:  76 year old male with a history of fall and right sided hip pain EXAM: DG HIP (WITH OR WITHOUT PELVIS) 2-3V RIGHT COMPARISON:  None. FINDINGS: Bony pelvic ring intact. No acute displaced fracture identified. Surgical changes of right hip arthroplasty. No perihardware fracture identified. Degenerative changes of the left hip. Vascular calcifications. IMPRESSION: Negative for acute bony abnormality. Surgical changes of prior right hip arthroplasty. Atherosclerotic calcifications Electronically Signed   By: Corrie Mckusick D.O.   On: 08/07/2016 13:09     CT abdomen and pelvis from 06/20/2016 showed No acute findings.  3 mm nonobstructing right renal calculus. No evidence of ureteral calculi or hydronephrosis.  4.4 cm infrarenal abdominal aortic aneurysm, without significant change. Recommend followup by ultrasound in 1 year. This recommendation follows ACR consensus guidelines: White Paper of the ACR Incidental Findings Committee II on Vascular  Findings. J Am Coll Radiol 2013; 10:789-794.  1.8 cm cystic lesion in pancreatic tail, without significant change compared to prior exam. 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 Recommend continued imaging followup in 12 months, preferably with abdomen MRI without and with contrast. This recommendation follows ACR consensus guidelines: Management of Incidental Pancreatic Cysts: A White Paper of the ACR Incidental Findings Committee. [place] 7673;41:937-902.  Stable mild porta hepatis lymphadenopathy, likely reactive/benign in etiology. This can also be followed up with MRI.  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 ____________________________________________   PROCEDURES Procedures  ____________________________________________   INITIAL IMPRESSION / ASSESSMENT AND PLAN / ED COURSE  Pertinent labs & imaging results that were available during my care of the patient were reviewed by me and considered in my medical decision making (see chart for details).  Patient presents after mechanical fall. X-ray of the right hip and the right chest wall is unremarkable. However he also has abdominal tenderness and distention in the setting of constipation. Get x-rays of the abdomen to evaluate for possible obstruction.     ----------------------------------------- 3:05 PM on 08/07/2016 -----------------------------------------  No evidence of obstruction on abdominal x-rays. Does have constipation. This is in the setting of chronic Percocet use. We'll have him start a laxative and stool softener regimen and follow up with primary care.Considering the patient's symptoms, medical history, and physical examination today, I have low suspicion for cholecystitis or biliary pathology, pancreatitis, perforation or bowel obstruction, hernia, intra-abdominal abscess, AAA or dissection, volvulus or  intussusception, mesenteric ischemia, or appendicitis.  No evidence of large soft tissue hematoma or other excessive blood loss.  ____________________________________________   FINAL CLINICAL IMPRESSION(S) / ED DIAGNOSES  Final diagnoses:  Constipation  Abdominal pain  Contusion of lower back, initial encounter  Fall in home, initial encounter      New Prescriptions   POLYETHYLENE GLYCOL POWDER (GLYCOLAX/MIRALAX) POWDER    2 cap fulls in a full glass of water, three times  a day, for 5 days.   SENNA-DOCUSATE (SENOKOT-S) 8.6-50 MG TABLET    Take 2 tablets by mouth 2 (two) times daily.     Portions of this note were generated with dragon dictation software. Dictation errors may occur despite best attempts at proofreading.    Carrie Mew, MD 08/07/16 [id number]

## 2016-08-07 NOTE — ED Notes (Signed)
Patient transported to X-ray 

## 2016-08-07 NOTE — ED Triage Notes (Signed)
First nurse note-Pt arrived [facility] for back pain after fall 2 days ago. Bruising to back. Is on blood thinners

## 2016-08-07 NOTE — ED Notes (Signed)
Ambulated pt to restroom, steady gait noted. 1 assistance.

## 2016-08-07 NOTE — ED Triage Notes (Signed)
Patient to ER for c/o pain to right hip and pain to right axillary/rib area after fall today. Patient states he tripped and fell backwards hitting wooden foot board of bed. Patient denies LOC. Denies dizziness. Patient states he was ambulatory after incident.

## 2016-08-07 NOTE — Discharge Instructions (Signed)
The x-rays today are unremarkable but it does appear that your constipated. Take laxatives and stool softeners to regulate her bowel movements. Follow up with primary care for continued monitoring of your symptoms.

## 2016-08-08 ENCOUNTER — Observation Stay
Admission: EM | Admit: 2016-08-08 | Discharge: 2016-08-10 | Disposition: A | Discharge status: Home / Self Care | Payer: Medicare Other | Attending: Internal Medicine | Admitting: Internal Medicine

## 2016-08-08 ENCOUNTER — Encounter: Payer: Self-pay | Admitting: Emergency Medicine

## 2016-08-08 ENCOUNTER — Emergency Department: Payer: Medicare Other

## 2016-08-08 DIAGNOSIS — Z7951 Long term (current) use of inhaled steroids: Secondary | ICD-10-CM | POA: Diagnosis not present

## 2016-08-08 DIAGNOSIS — Z86718 Personal history of other venous thrombosis and embolism: Secondary | ICD-10-CM | POA: Diagnosis not present

## 2016-08-08 DIAGNOSIS — Z79899 Other long term (current) drug therapy: Secondary | ICD-10-CM | POA: Insufficient documentation

## 2016-08-08 DIAGNOSIS — F419 Anxiety disorder, unspecified: Secondary | ICD-10-CM | POA: Diagnosis present

## 2016-08-08 DIAGNOSIS — Z87891 Personal history of nicotine dependence: Secondary | ICD-10-CM | POA: Insufficient documentation

## 2016-08-08 DIAGNOSIS — Z794 Long term (current) use of insulin: Secondary | ICD-10-CM | POA: Insufficient documentation

## 2016-08-08 DIAGNOSIS — S2241XA Multiple fractures of ribs, right side, initial encounter for closed fracture: Principal | ICD-10-CM | POA: Insufficient documentation

## 2016-08-08 DIAGNOSIS — I2510 Atherosclerotic heart disease of native coronary artery without angina pectoris: Secondary | ICD-10-CM | POA: Insufficient documentation

## 2016-08-08 DIAGNOSIS — W19XXXA Unspecified fall, initial encounter: Secondary | ICD-10-CM | POA: Diagnosis not present

## 2016-08-08 DIAGNOSIS — I4891 Unspecified atrial fibrillation: Secondary | ICD-10-CM | POA: Diagnosis present

## 2016-08-08 DIAGNOSIS — K219 Gastro-esophageal reflux disease without esophagitis: Secondary | ICD-10-CM | POA: Diagnosis not present

## 2016-08-08 DIAGNOSIS — E785 Hyperlipidemia, unspecified: Secondary | ICD-10-CM | POA: Diagnosis not present

## 2016-08-08 DIAGNOSIS — I10 Essential (primary) hypertension: Secondary | ICD-10-CM | POA: Diagnosis not present

## 2016-08-08 DIAGNOSIS — I252 Old myocardial infarction: Secondary | ICD-10-CM | POA: Diagnosis not present

## 2016-08-08 DIAGNOSIS — K5900 Constipation, unspecified: Secondary | ICD-10-CM | POA: Diagnosis present

## 2016-08-08 DIAGNOSIS — Z86711 Personal history of pulmonary embolism: Secondary | ICD-10-CM | POA: Insufficient documentation

## 2016-08-08 DIAGNOSIS — Z7901 Long term (current) use of anticoagulants: Secondary | ICD-10-CM | POA: Insufficient documentation

## 2016-08-08 DIAGNOSIS — Z7952 Long term (current) use of systemic steroids: Secondary | ICD-10-CM | POA: Insufficient documentation

## 2016-08-08 DIAGNOSIS — Z951 Presence of aortocoronary bypass graft: Secondary | ICD-10-CM | POA: Insufficient documentation

## 2016-08-08 DIAGNOSIS — I700 Atherosclerosis of aorta: Secondary | ICD-10-CM | POA: Diagnosis not present

## 2016-08-08 DIAGNOSIS — S2239XA Fracture of one rib, unspecified side, initial encounter for closed fracture: Secondary | ICD-10-CM | POA: Diagnosis present

## 2016-08-08 DIAGNOSIS — Z96641 Presence of right artificial hip joint: Secondary | ICD-10-CM | POA: Insufficient documentation

## 2016-08-08 DIAGNOSIS — E119 Type 2 diabetes mellitus without complications: Secondary | ICD-10-CM

## 2016-08-08 DIAGNOSIS — Z96653 Presence of artificial knee joint, bilateral: Secondary | ICD-10-CM | POA: Diagnosis not present

## 2016-08-08 DIAGNOSIS — I714 Abdominal aortic aneurysm, without rupture: Secondary | ICD-10-CM | POA: Diagnosis not present

## 2016-08-08 LAB — COMPREHENSIVE METABOLIC PANEL
ALK PHOS: 49 U/L (ref 38–126)
ALT: 13 U/L — AB (ref 17–63)
AST: 28 U/L (ref 15–41)
Albumin: 3.5 g/dL (ref 3.5–5.0)
Anion gap: 7 (ref 5–15)
BILIRUBIN TOTAL: 1.3 mg/dL — AB (ref 0.3–1.2)
BUN: 22 mg/dL — ABNORMAL HIGH (ref 6–20)
CALCIUM: 9.9 mg/dL (ref 8.9–10.3)
CO2: 29 mmol/L (ref 22–32)
CREATININE: 1.19 mg/dL (ref 0.61–1.24)
Chloride: 100 mmol/L — ABNORMAL LOW (ref 101–111)
GFR, EST NON AFRICAN AMERICAN: 57 mL/min — AB (ref >60)
Glucose, Bld: 200 mg/dL — ABNORMAL HIGH (ref 65–99)
Potassium: 4.5 mmol/L (ref 3.5–5.1)
Sodium: 136 mmol/L (ref 135–145)
Total Protein: 6.6 g/dL (ref 6.5–8.1)

## 2016-08-08 LAB — CBC WITH DIFFERENTIAL/PLATELET
Basophils Absolute: 0 10*3/uL (ref 0–0.1)
Basophils Relative: 0 %
EOS PCT: 1 %
Eosinophils Absolute: 0 10*3/uL (ref 0–0.7)
HEMATOCRIT: 42 % (ref 40.0–52.0)
HEMOGLOBIN: 14.3 g/dL (ref 13.0–18.0)
LYMPHS ABS: 0.9 10*3/uL — AB (ref 1.0–3.6)
LYMPHS PCT: 15 %
MCH: 31.7 pg (ref 26.0–34.0)
MCHC: 34 g/dL (ref 32.0–36.0)
MCV: 93.2 fL (ref 80.0–100.0)
Monocytes Absolute: 0.3 10*3/uL (ref 0.2–1.0)
Monocytes Relative: 5 %
NEUTROS PCT: 79 %
Neutro Abs: 4.8 10*3/uL (ref 1.4–6.5)
Platelets: 135 10*3/uL — ABNORMAL LOW (ref 150–440)
RBC: 4.51 MIL/uL (ref 4.40–5.90)
RDW: 17.2 % — ABNORMAL HIGH (ref 11.5–14.5)
WBC: 6.1 10*3/uL (ref 3.8–10.6)

## 2016-08-08 LAB — GLUCOSE, CAPILLARY: Glucose-Capillary: 116 mg/dL — ABNORMAL HIGH (ref 65–99)

## 2016-08-08 MED ORDER — PANTOPRAZOLE SODIUM 40 MG PO TBEC
40.0000 mg | DELAYED_RELEASE_TABLET | Freq: Every day | ORAL | Status: DC
  Administered 2016-08-09 – 2016-08-10 (×2): 40 mg via ORAL
  Filled 2016-08-08 (×2): qty 1

## 2016-08-08 MED ORDER — PREDNISONE 5 MG PO TABS
5.0000 mg | ORAL_TABLET | Freq: Every day | ORAL | Status: DC
  Administered 2016-08-09 – 2016-08-10 (×2): 5 mg via ORAL
  Filled 2016-08-08 (×2): qty 1

## 2016-08-08 MED ORDER — ACETAMINOPHEN 650 MG RE SUPP: 650.0000 mg | Freq: Four times a day (QID) | RECTAL | Status: DC | PRN

## 2016-08-08 MED ORDER — TRIAMTERENE-HCTZ 37.5-25 MG PO TABS
1.0000 | ORAL_TABLET | Freq: Every day | ORAL | Status: DC
  Administered 2016-08-09 – 2016-08-10 (×2): 1 via ORAL
  Filled 2016-08-08 (×2): qty 1

## 2016-08-08 MED ORDER — INSULIN ASPART 100 UNIT/ML ~~LOC~~ SOLN
0.0000 [IU] | Freq: Three times a day (TID) | SUBCUTANEOUS | Status: DC
  Administered 2016-08-09 (×2): 3 [IU] via SUBCUTANEOUS
  Administered 2016-08-09: 1 [IU] via SUBCUTANEOUS
  Administered 2016-08-09: 2 [IU] via SUBCUTANEOUS
  Filled 2016-08-08: qty 2
  Filled 2016-08-08 (×2): qty 3
  Filled 2016-08-08: qty 1

## 2016-08-08 MED ORDER — OXYCODONE-ACETAMINOPHEN 5-325 MG PO TABS: 1.0000 | ORAL_TABLET | Freq: Four times a day (QID) | ORAL | 0 refills | Status: DC | PRN

## 2016-08-08 MED ORDER — ACETAMINOPHEN 325 MG PO TABS: 650.0000 mg | ORAL_TABLET | Freq: Four times a day (QID) | ORAL | Status: DC | PRN

## 2016-08-08 MED ORDER — ONDANSETRON HCL 4 MG/2ML IJ SOLN
4.0000 mg | Freq: Once | INTRAMUSCULAR | Status: AC
  Administered 2016-08-08: 4 mg via INTRAVENOUS
  Filled 2016-08-08: qty 2

## 2016-08-08 MED ORDER — ATENOLOL 12.5 MG HALF TABLET
12.5000 mg | ORAL_TABLET | Freq: Every day | ORAL | Status: DC
  Administered 2016-08-09 – 2016-08-10 (×2): 12.5 mg via ORAL
  Filled 2016-08-08 (×2): qty 1

## 2016-08-08 MED ORDER — APIXABAN 5 MG PO TABS
5.0000 mg | ORAL_TABLET | Freq: Two times a day (BID) | ORAL | Status: DC
  Administered 2016-08-08 – 2016-08-10 (×4): 5 mg via ORAL
  Filled 2016-08-08 (×4): qty 1

## 2016-08-08 MED ORDER — HYDROMORPHONE HCL 1 MG/ML IJ SOLN
0.5000 mg | Freq: Once | INTRAMUSCULAR | Status: AC
  Administered 2016-08-08: 0.5 mg via INTRAVENOUS
  Filled 2016-08-08: qty 1

## 2016-08-08 MED ORDER — TAMSULOSIN HCL 0.4 MG PO CAPS
0.4000 mg | ORAL_CAPSULE | Freq: Every day | ORAL | Status: DC
  Administered 2016-08-09 – 2016-08-10 (×2): 0.4 mg via ORAL
  Filled 2016-08-08 (×2): qty 1

## 2016-08-08 MED ORDER — LORAZEPAM 0.5 MG PO TABS
0.5000 mg | ORAL_TABLET | Freq: Two times a day (BID) | ORAL | Status: DC | PRN
  Administered 2016-08-08 – 2016-08-09 (×2): 0.5 mg via ORAL
  Filled 2016-08-08 (×2): qty 1

## 2016-08-08 MED ORDER — ONDANSETRON HCL 4 MG/2ML IJ SOLN: 4.0000 mg | Freq: Four times a day (QID) | INTRAMUSCULAR | Status: DC | PRN

## 2016-08-08 MED ORDER — ONDANSETRON HCL 4 MG PO TABS: 4.0000 mg | ORAL_TABLET | Freq: Four times a day (QID) | ORAL | Status: DC | PRN

## 2016-08-08 MED ORDER — OXYCODONE HCL 5 MG PO TABS
5.0000 mg | ORAL_TABLET | ORAL | Status: DC | PRN
  Administered 2016-08-08 – 2016-08-10 (×5): 5 mg via ORAL
  Filled 2016-08-08 (×5): qty 1

## 2016-08-08 MED ORDER — CARBIDOPA-LEVODOPA 25-100 MG PO TABS
1.0000 | ORAL_TABLET | Freq: Three times a day (TID) | ORAL | Status: DC
  Administered 2016-08-08 – 2016-08-10 (×5): 1 via ORAL
  Filled 2016-08-08 (×5): qty 1

## 2016-08-08 MED ORDER — MORPHINE SULFATE (PF) 2 MG/ML IV SOLN
2.0000 mg | INTRAVENOUS | Status: DC | PRN
  Administered 2016-08-09: 2 mg via INTRAVENOUS
  Filled 2016-08-08: qty 1

## 2016-08-08 MED ORDER — SODIUM CHLORIDE 0.9 % IV SOLN
INTRAVENOUS | Status: AC
  Administered 2016-08-08: 23:00:00 via INTRAVENOUS

## 2016-08-08 MED ORDER — POLYETHYLENE GLYCOL 3350 17 G PO PACK
17.0000 g | PACK | Freq: Two times a day (BID) | ORAL | Status: DC
  Administered 2016-08-08 – 2016-08-10 (×3): 17 g via ORAL
  Filled 2016-08-08 (×3): qty 1

## 2016-08-08 MED ORDER — HYDROMORPHONE HCL 1 MG/ML IJ SOLN
0.5000 mg | Freq: Once | INTRAMUSCULAR | Status: AC
  Administered 2016-08-08: 0.5 mg via INTRAVENOUS

## 2016-08-08 MED ORDER — PEG 3350-KCL-NA BICARB-NACL 420 G PO SOLR: 4000.0000 mL | Freq: Once | ORAL | 0 refills | Status: DC

## 2016-08-08 MED ORDER — ATORVASTATIN CALCIUM 20 MG PO TABS
80.0000 mg | ORAL_TABLET | Freq: Every day | ORAL | Status: DC
  Administered 2016-08-08 – 2016-08-09 (×2): 80 mg via ORAL
  Filled 2016-08-08 (×2): qty 4

## 2016-08-08 NOTE — ED Notes (Signed)
Pt. Family member does not want pt. Discharged without having a bowel movement.

## 2016-08-08 NOTE — ED Notes (Signed)
Pt able to to ambulate with assistance.

## 2016-08-08 NOTE — ED Notes (Signed)
First nurse note   Brought in via ems from home abd /back pain  Hx of fall  Also here for constipation and abd distended and constipated

## 2016-08-08 NOTE — ED Triage Notes (Signed)
Pt reports last BM four days ago. Pt seen here yesterday for abdominal pain, low back pain and constipation. Pt reports has taken miralax without relief. Pt reports feeling intermittent abdominal cramps. Pt states he needs somebody to clean him out.

## 2016-08-08 NOTE — ED Provider Notes (Addendum)
[facility] Emergency Department Provider Note       Time seen: ----------------------------------------- 3:49 PM on 08/08/2016 -----------------------------------------     I have reviewed the triage vital signs and the nursing notes.   HISTORY   Chief Complaint Constipation    HPI Sean Armstrong is a 76 y.o. male who presents to the ED for constipation. Patient reports last bowel movement was on Tuesday, 3-4 days ago. Patient seen here yesterday for abdominal pain and low back pain and constipation. He reports he had taken MiraLAX without any relief. He is having intermittent abdominal cramping and states he needs someone to clean him out. He denies fevers, chills, vomiting or diarrhea.   Past Medical History:  Diagnosis Date  . Anemia   . Colon polyp   . Coronary artery disease   . Diabetes mellitus without complication ([place])   . FHx: SVT (supraventricular tachycardia) 1998  . GERD (gastroesophageal reflux disease)   . History of DVT (deep vein thrombosis) 2009  . History of pulmonary embolism 08/2008  . Hyperlipidemia   . Hypertension   . Hypothyroidism   . Lumbar disc disease   . MGUS (monoclonal gammopathy of unknown significance)   . Myocardial infarction ([place])   . Nephrolithiasis   . Osteoarthritis   . Peptic ulcer disease   . Renal insufficiency   . Rheumatoid arthritis [facility])     Patient Active Problem List   Diagnosis Date Noted  . Deep vein thrombosis (DVT) of popliteal vein of right lower extremity ([place]) 06/18/2016  . Left leg weakness 12/05/2015  . UTI (lower urinary tract infection) 12/05/2015  . Diabetes ([place]) 12/05/2015  . Anxiety 12/05/2015  . GERD (gastroesophageal reflux disease) 12/05/2015  . HLD (hyperlipidemia) 12/05/2015  . BPH (benign prostatic hyperplasia) 12/05/2015  . A-fib ([place]) 12/05/2015  . Sepsis ([place]) 06/18/2015    Past Surgical History:  Procedure Laterality Date  . CARDIAC CATHETERIZATION    .  CHOLECYSTECTOMY    . COLONOSCOPY  03/2007, 2006  . CORONARY ARTERY BYPASS GRAFT    . ESOPHAGOGASTRODUODENOSCOPY (EGD) WITH PROPOFOL N/A 07/02/2015   Procedure: ESOPHAGOGASTRODUODENOSCOPY (EGD) WITH PROPOFOL;  Surgeon: Manya Silvas, MD;  Location: [facility] ENDOSCOPY;  Service: Endoscopy;  Laterality: N/A;  . EYE SURGERY    . JOINT REPLACEMENT    . REPLACEMENT TOTAL KNEE BILATERAL    . TOTAL HIP ARTHROPLASTY Right     Allergies Gentamycin [gentamicin]; Iodinated diagnostic agents; Meloxicam; Metrizamide; Penicillin v potassium; Celecoxib; Ciprofloxacin; Hydrocodone; and Propranolol  Social History Social History  Substance Use Topics  . Smoking status: Former Smoker    Types: Cigarettes  . Smokeless tobacco: Current User    Types: Chew     Comment: quit smoking cigerates 1995  . Alcohol use No    Review of Systems Constitutional: Negative for fever. Eyes: Negative for vision changes ENT:  Negative for congestion, sore throat Cardiovascular: Negative for chest pain. Respiratory: Negative for shortness of breath. Gastrointestinal: Positive for abdominal pain, constipation Genitourinary: Negative for dysuria. Musculoskeletal: Negative for back pain, positive for flank and back pain Skin: Negative for rash. Neurological: Negative for headaches, focal weakness or numbness.  All systems negative/normal/unremarkable except as stated in the HPI  ____________________________________________   PHYSICAL EXAM:  VITAL SIGNS: ED Triage Vitals [08/08/16 1219]  Enc Vitals Group     BP (!) 156/67     Pulse Rate (!) 58     Resp 18     Temp 98.1 F (36.7 C)  Temp Source Oral     SpO2 100 %     Weight 195 lb (88.5 kg)     Height 5\' 9"  (1.753 m)     Head Circumference      Peak Flow      Pain Score 10     Pain Loc      Pain Edu?      Excl. in [place]?     Constitutional: Alert and oriented. Mild distress Eyes: Conjunctivae are normal. Normal extraocular movements. ENT    Head: Normocephalic and atraumatic.   Nose: No congestion/rhinnorhea.   Mouth/Throat: Mucous membranes are moist.   Neck: No stridor. Cardiovascular: Normal rate, regular rhythm. No murmurs, rubs, or gallops. Respiratory: Normal respiratory effort without tachypnea nor retractions. Breath sounds are clear and equal bilaterally. No wheezes/rales/rhonchi. Gastrointestinal: Diffuse abdominal tenderness, worse in the right flank Musculoskeletal: Nontender with normal range of motion in extremities. No lower extremity tenderness nor edema. Neurologic:  Normal speech and language. No gross focal neurologic deficits are appreciated.  Skin:  Skin is warm, dry and intact. No rash noted. Psychiatric: Mood and affect are normal. Speech and behavior are normal.  ____________________________________________  ED COURSE:  Pertinent labs & imaging results that were available during my care of the patient were reviewed by me and considered in my medical decision making (see chart for details). Patient presents for flank pain and constipation, we will assess with labs and imaging as indicated. Clinical Course as of Aug 09 2018  Fri Aug 08, 2016  2019 Patient's son has been upset at his length of stay and his inability to have a bowel movement. He still has considerable pain in the right flank from his rib fracture and constipation. I will discuss with the hospitalist for admission.  [JW]    Clinical Course User Index [JW] Earleen Newport, MD   Procedures ____________________________________________   LABS (pertinent positives/negatives)  Labs Reviewed  CBC WITH DIFFERENTIAL/PLATELET - Abnormal; Notable for the following:       Result Value   RDW 17.2 (*)    Platelets 135 (*)    Lymphs Abs 0.9 (*)    All other components within normal limits  COMPREHENSIVE METABOLIC PANEL - Abnormal; Notable for the following:    Chloride 100 (*)    Glucose, Bld 200 (*)    BUN 22 (*)    ALT 13 (*)     Total Bilirubin 1.3 (*)    GFR calc non Af Amer 57 (*)    All other components within normal limits    RADIOLOGY Images were viewed by me Acute abdominal series IMPRESSION: There are slightly displaced fractures of the right eighth and ninth ribs posterolaterally. Otherwise benign appearing abdomen and chest. CT of the abdomen and pelvis without contrast  IMPRESSION: 1. No acute abnormality of the abdomen or pelvis. 2. Unchanged infrarenal abdominal aortic aneurysm. Recommend followup by ultrasound in 1 year. This recommendation follows ACR consensus guidelines: White Paper of the ACR Incidental Findings Committee II on Vascular Findings. J Am Coll Radiol 2013; 10:789-794. 3. Aortic atherosclerosis. 4. Minimally displaced right ninth rib fracture. 5. 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 Recommend follow up pre and post contrast MRI/MRCP or pancreatic protocol CT in 12 months. This recommendation follows ACR consensus guidelines: Management of Incidental Pancreatic Cysts: A White Paper of the ACR Incidental Findings Committee. [place] [phone].  ____________________________________________  FINAL ASSESSMENT AND PLAN  Fall, rib fractures, constipation  Plan: Patient's labs  and imaging were dictated above. Patient had presented for flank pain and constipation. Flank pain appears to be secondary to rib fracture seen on the right side after a fall. He was then given an enema here for improvement in his constipation with little results. I will discuss with the hospitalist for admission.   Earleen Newport, MD   Note: This note was generated in part or whole with voice recognition software. Voice recognition is usually quite accurate but there are transcription errors that can and very often do occur. I apologize for any typographical errors that were not detected and corrected.     Earleen Newport, MD 08/08/16 [place]    Earleen Newport, MD 08/08/16 1924    Earleen Newport, MD 08/08/16 2020

## 2016-08-08 NOTE — H&P (Signed)
[facility] NAME: Sean Armstrong    MR#:  [id number]  DATE OF BIRTH:  Jul 19, 1940  DATE OF ADMISSION:  08/08/2016  PRIMARY CARE PHYSICIAN: Albina Billet, MD   REQUESTING/REFERRING PHYSICIAN: Jimmye Norman, MD  CHIEF COMPLAINT:   Chief Complaint  Patient presents with  . Constipation    HISTORY OF PRESENT ILLNESS:  Sean Armstrong  is a 76 y.o. male who presents with Pain from rib fracture after a fall, and significant constipation. Patient fell a few days ago, and fractured his ribs. He was artery on some pain medication prior to this for back pain, and received quite a bit of pain medication over the last few days due to the fall and rib fracture. He presents again today with continued rib pain, and significant constipation. In the ED he was given for mottling stool softening agent, as well as an enema with no success in achieving bowel movement. Hospitalists were called for admission  PAST MEDICAL HISTORY:   Past Medical History:  Diagnosis Date  . Anemia   . Colon polyp   . Coronary artery disease   . Diabetes mellitus without complication ([place])   . FHx: SVT (supraventricular tachycardia) 1998  . GERD (gastroesophageal reflux disease)   . History of DVT (deep vein thrombosis) 2009  . History of pulmonary embolism 08/2008  . Hyperlipidemia   . Hypertension   . Hypothyroidism   . Lumbar disc disease   . MGUS (monoclonal gammopathy of unknown significance)   . Myocardial infarction ([place])   . Nephrolithiasis   . Osteoarthritis   . Peptic ulcer disease   . Renal insufficiency   . Rheumatoid arthritis ([place])     PAST SURGICAL HISTORY:   Past Surgical History:  Procedure Laterality Date  . CARDIAC CATHETERIZATION    . CHOLECYSTECTOMY    . COLONOSCOPY  03/2007, 2006  . CORONARY ARTERY BYPASS GRAFT    . ESOPHAGOGASTRODUODENOSCOPY (EGD) WITH PROPOFOL N/A 07/02/2015   Procedure: ESOPHAGOGASTRODUODENOSCOPY (EGD) WITH  PROPOFOL;  Surgeon: Sean Silvas, MD;  Location: [facility] ENDOSCOPY;  Service: Endoscopy;  Laterality: N/A;  . EYE SURGERY    . JOINT REPLACEMENT    . REPLACEMENT TOTAL KNEE BILATERAL    . TOTAL HIP ARTHROPLASTY Right     SOCIAL HISTORY:   Social History  Substance Use Topics  . Smoking status: Former Smoker    Types: Cigarettes  . Smokeless tobacco: Current User    Types: Chew     Comment: quit smoking cigerates 1995  . Alcohol use No    FAMILY HISTORY:   Family History  Problem Relation Age of Onset  . Heart attack Father   . Diabetes Father   . Heart attack Brother     DRUG ALLERGIES:   Allergies  Allergen Reactions  . Gentamycin [Gentamicin] Other (See Comments)    Reaction:  Caused kidney issues for pt   . Iodinated Diagnostic Agents     Other reaction(s): Other (See Comments) "Burning sensation throughout whole body"  . Meloxicam     Other reaction(s): Other (See Comments) GI UPSET  . Metrizamide     Other reaction(s): Other (See Comments) "Burning sensation throughout whole body"  . Penicillin V Potassium     Other reaction(s): Other (See Comments) No effect with ampicillin  . Celecoxib Nausea Only    Other reaction(s): Other (See Comments) GI upset  . Ciprofloxacin Nausea Only  . Hydrocodone Nausea Only  . Propranolol  Palpitations    MEDICATIONS AT HOME:   Prior to Admission medications   Medication Sig Start Date End Date Taking? Authorizing Provider  apixaban (ELIQUIS) 5 MG TABS tablet Take 1 tablet (5 mg total) by mouth 2 (two) times daily. 12/07/15  Yes Sean Armstrong, Sean Heimlich, MD  atenolol (TENORMIN) 25 MG tablet Take 0.5 tablets (12.5 mg total) by mouth daily. 12/07/15  Yes Sean Bow, MD  atorvastatin (LIPITOR) 80 MG tablet Take 1 tablet (80 mg total) by mouth daily at 6 PM. 12/07/15  Yes Sean Armstrong, Srikar, MD  carbidopa-levodopa (PARCOPA) 25-100 MG disintegrating tablet Take 1 tablet by mouth 3 (three) times daily.    Yes [provider]   cetirizine (ZYRTEC) 10 MG tablet Take 10 mg by mouth daily.   Yes [provider]  fluticasone (FLONASE) 50 MCG/ACT nasal spray Place 2 sprays into both nostrils daily.   Yes [provider]  folic acid (FOLVITE) 1 MG tablet Take 1 mg by mouth daily.   Yes [provider]  gabapentin (NEURONTIN) 400 MG capsule Take 400 mg by mouth daily.    Yes [provider]  glimepiride (AMARYL) 2 MG tablet Take 2 mg by mouth 2 (two) times daily.    Yes [provider]  Insulin Detemir (LEVEMIR FLEXTOUCH) 100 UNIT/ML Pen Inject 48 Units into the skin at bedtime.   Yes [provider]  loperamide (IMODIUM) 2 MG capsule Take 1 capsule (2 mg total) by mouth every 4 (four) hours as needed for diarrhea or loose stools. 06/20/15  Yes Sean Armstrong, Sean Heimlich, MD  methotrexate (RHEUMATREX) 2.5 MG tablet Take 12.5 mg by mouth once a week. Pt takes on Wednesday.  Caution:Chemotherapy. Protect from light.   Yes [provider]  Multiple Vitamins-Minerals (CENTRUM SILVER PO) Take 1 tablet by mouth daily.    Yes [provider]  omeprazole (PRILOSEC) 20 MG capsule Take 40 mg by mouth daily.   Yes [provider]  potassium chloride SA (K-DUR,KLOR-CON) 20 MEQ tablet Take 40 mEq by mouth daily.    Yes [provider]  predniSONE (DELTASONE) 5 MG tablet Take 5 mg by mouth daily with breakfast.   Yes [provider]  sucralfate (CARAFATE) 1 g tablet Take 1 g by mouth 2 (two) times daily.  06/05/15 08/08/16 Yes [provider]  tamsulosin (FLOMAX) 0.4 MG CAPS capsule Take 0.4 mg by mouth daily after breakfast.    Yes [provider]  triamterene-hydrochlorothiazide (MAXZIDE) 75-50 MG tablet Take 1 tablet by mouth daily.   Yes [provider]  LORazepam (ATIVAN) 0.5 MG tablet Take 0.5 mg by mouth 2 (two) times daily as needed for anxiety.     [provider]  oxyCODONE-acetaminophen (PERCOCET) 5-325 MG tablet  Take 1-2 tablets by mouth every 6 (six) hours as needed for moderate pain or severe pain. 08/08/16   Earleen Newport, MD  polyethylene glycol powder (GLYCOLAX/MIRALAX) powder 2 cap fulls in a full glass of water, three times a day, for 5 days. 08/07/16   Carrie Mew, MD  polyethylene glycol-electrolytes (TRILYTE) 420 g solution Take 4,000 mLs by mouth once. 08/08/16 08/08/16  Earleen Newport, MD  senna-docusate (SENOKOT-S) 8.6-50 MG tablet Take 2 tablets by mouth 2 (two) times daily. 08/07/16   Carrie Mew, MD  traMADol (ULTRAM) 50 MG tablet Take 50 mg by mouth every 6 (six) hours as needed.  01/22/15   [provider]    REVIEW OF SYSTEMS:  Review of Systems  Constitutional:  Negative for chills, fever, malaise/fatigue and weight loss.  HENT: Negative for ear pain, hearing loss and tinnitus.   Eyes: Negative for blurred vision, double vision, pain and redness.  Respiratory: Negative for cough, hemoptysis and shortness of breath.   Cardiovascular: Positive for chest pain (Due to rib fracture). Negative for palpitations, orthopnea and leg swelling.  Gastrointestinal: Positive for abdominal pain and constipation. Negative for diarrhea, nausea and vomiting.  Genitourinary: Negative for dysuria, frequency and hematuria.  Musculoskeletal: Negative for back pain, joint pain and neck pain.  Skin:       No acne, rash, or lesions  Neurological: Negative for dizziness, tremors, focal weakness and weakness.  Endo/Heme/Allergies: Negative for polydipsia. Does not bruise/bleed easily.  Psychiatric/Behavioral: Negative for depression. The patient is not nervous/anxious and does not have insomnia.      VITAL SIGNS:   Vitals:   08/08/16 1219 08/08/16 1530 08/08/16 1800 08/08/16 1830  BP: (!) 156/67 (!) 164/86 (!) 180/86 (!) 166/79  Pulse: (!) 58 60 (!) 58 62  Resp: 18 18 10 20   Temp: 98.1 F (36.7 C)     TempSrc: Oral     SpO2: 100% 98% 96% 98%  Weight: 88.5 kg (195 lb)      Height: 5\' 9"  (1.753 m)      Wt Readings from Last 3 Encounters:  08/08/16 88.5 kg (195 lb)  08/07/16 88.5 kg (195 lb)  06/27/16 89.4 kg (197 lb)    PHYSICAL EXAMINATION:  Physical Exam  Vitals reviewed. Constitutional: He is oriented to person, place, and time. He appears well-developed and well-nourished. No distress.  HENT:  Head: Normocephalic and atraumatic.  Mouth/Throat: Oropharynx is clear and moist.  Eyes: Conjunctivae and EOM are normal. Pupils are equal, round, and reactive to light. No scleral icterus.  Neck: Normal range of motion. Neck supple. No JVD present. No thyromegaly present.  Cardiovascular: Normal rate, regular rhythm and intact distal pulses.  Exam reveals no gallop and no friction rub.   No murmur heard. Respiratory: Effort normal and breath sounds normal. No respiratory distress. He has no wheezes. He has no rales. He exhibits tenderness (At site of rib fracture).  GI: Soft. He exhibits distension. There is tenderness.  Bowel sounds slowed  Musculoskeletal: Normal range of motion. He exhibits no edema.  No arthritis, no gout  Lymphadenopathy:    He has no cervical adenopathy.  Neurological: He is alert and oriented to person, place, and time. No cranial nerve deficit.  No dysarthria, no aphasia  Skin: Skin is warm and dry. No rash noted. No erythema.  Psychiatric: He has a normal mood and affect. His behavior is normal. Judgment and thought content normal.    LABORATORY PANEL:   CBC  Recent Labs Lab 08/08/16 1513  WBC 6.1  HGB 14.3  HCT 42.0  PLT 135*   ------------------------------------------------------------------------------------------------------------------  Chemistries   Recent Labs Lab 08/08/16 1513  NA 136  K 4.5  CL 100*  CO2 29  GLUCOSE 200*  BUN 22*  CREATININE 1.19  CALCIUM 9.9  AST 28  ALT 13*  ALKPHOS 49  BILITOT 1.3*    ------------------------------------------------------------------------------------------------------------------  Cardiac Enzymes No results for input(s): TROPONINI in the last 168 hours. ------------------------------------------------------------------------------------------------------------------  RADIOLOGY:  Ct Abdomen Pelvis Wo Contrast  Result Date: 08/08/2016 CLINICAL DATA:  Constipation and diffuse abdominal pain. Recent fall. EXAM: CT ABDOMEN AND PELVIS WITHOUT CONTRAST TECHNIQUE: Multidetector CT imaging of the abdomen and pelvis was performed following the standard protocol without IV contrast. COMPARISON:  CT abdomen pelvis 06/20/2016 FINDINGS: Lower chest: Small right pleural effusion. Hepatobiliary: Normal hepatic size and contours. No perihepatic ascites. No intra- or extrahepatic biliary dilatation. Gallbladder surgically absent. Pancreas: Unchanged appearance of 1.8 cm cystic lesion in the pancreatic tail. No peripancreatic fluid collection or pancreatic ductal dilatation. Spleen: Normal. Adrenals/Urinary Tract: Normal adrenal glands. There are multiple bilateral renal cysts of varying densities that are unchanged from the examination of 06/20/2016. Calcification of the right lower pole is unchanged. No hydronephrosis. No ureteral obstruction. Stomach/Bowel: There is no hiatal hernia. The stomach and duodenum are normal. There is no dilated small bowel or enteric inflammation. There is no colonic abnormality. The appendix is normal. Vascular/Lymphatic: Infrarenal abdominal aortic aneurysm measures 4.1 cm, unchanged. There is atherosclerotic calcification throughout the aorta and its branch vessels. No abdominal or pelvic adenopathy. Reproductive: The prostate is enlarged, measuring 5.6 cm transverse. 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 There is multilevel lumbar facet arthrosis and osteophytosis. No bony spinal canal stenosis. There is a fracture of the right  ninth rib. Normal visualized extrathoracic and extraperitoneal soft tissues. Other: No contributory non-categorized findings. IMPRESSION: 1. No acute abnormality of the abdomen or pelvis. 2. Unchanged infrarenal abdominal aortic aneurysm. Recommend followup by ultrasound in 1 year. This recommendation follows ACR consensus guidelines: White Paper of the ACR Incidental Findings Committee II on Vascular Findings. J Am Coll Radiol 2013; 10:789-794. 3. Aortic atherosclerosis. 4. Minimally displaced right ninth rib fracture. 5. 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 Recommend follow up pre and post contrast MRI/MRCP or pancreatic protocol CT in 12 months. This recommendation follows ACR consensus guidelines: Management of Incidental Pancreatic Cysts: A White Paper of the ACR Incidental Findings Committee. [employer] 0932;35:573-220. Electronically Signed   By: Ulyses Jarred M.D.   On: 08/08/2016 18:20   Dg Ribs Unilateral W/chest Right  Addendum Date: 08/08/2016   ADDENDUM REPORT: 08/08/2016 14:51 ADDENDUM: Upon further review, there are nondisplaced fractures of the right eighth and ninth ribs. These are more conspicuous on subsequent abdominal radiographs obtained on 08/08/2016 where they demonstrate new mild displacement. Electronically Signed   By: Logan Bores M.D.   On: 08/08/2016 14:51   Result Date: 08/08/2016 CLINICAL DATA:  Fall at home 3 days ago. Right chest pain. Initial encounter. EXAM: RIGHT RIBS AND CHEST - 3+ VIEW COMPARISON:  Chest radiograph 06/18/2015 FINDINGS: The cardiac silhouette appears mildly enlarged, accentuated by AP technique and mildly shallow inspiration. Aortic atherosclerosis is noted. No airspace consolidation, edema, pleural effusion, or pneumothorax is identified. Right upper quadrant abdominal surgical clips and thoracic spondylosis are noted. No rib fracture is identified. IMPRESSION: No rib fracture identified. Electronically Signed: By: Logan Bores M.D. On:  08/07/2016 13:10   Dg Abd 2 Views  Result Date: 08/07/2016 CLINICAL DATA:  Abdominal pain. Constipation. Back pain after a fall 2 days ago. EXAM: ABDOMEN - 2 VIEW COMPARISON:  CT abdomen and pelvis 06/20/2016 FINDINGS: Cholecystectomy clips are noted. There is no evidence of intraperitoneal free air. A moderate amount of stool is present in the colon. No dilated loops of bowel are seen to suggest obstruction. Thoracolumbar spondylosis and prior right hip arthroplasty are noted. IMPRESSION: Moderate volume of colonic stool without evidence of obstruction. Electronically Signed   By: Logan Bores M.D.   On: 08/07/2016 14:38   Dg Abdomen Acute W/chest  Result Date: 08/08/2016 CLINICAL DATA:  Right upper quadrant abdominal pain since a fall 3 days ago. EXAM: DG ABDOMEN ACUTE W/ 1V  CHEST COMPARISON:  Radiographs dated 08/07/2016 FINDINGS: There are now visible slightly displaced fractures of the posterolateral aspects of the right eighth and ninth ribs. There is no pneumothorax or lung contusion or pleural effusion. Heart size and pulmonary vascularity are normal. Bowel gas pattern is normal.  Prior cholecystectomy. IMPRESSION: There are slightly displaced fractures of the right eighth and ninth ribs posterolaterally. Otherwise benign appearing abdomen and chest. Electronically Signed   By: Lorriane Shire M.D.   On: 08/08/2016 14:49   Dg Hip Unilat  With Pelvis 2-3 Views Right  Result Date: 08/07/2016 CLINICAL DATA:  76 year old male with a history of fall and right sided hip pain EXAM: DG HIP (WITH OR WITHOUT PELVIS) 2-3V RIGHT COMPARISON:  None. FINDINGS: Bony pelvic ring intact. No acute displaced fracture identified. Surgical changes of right hip arthroplasty. No perihardware fracture identified. Degenerative changes of the left hip. Vascular calcifications. IMPRESSION: Negative for acute bony abnormality. Surgical changes of prior right hip arthroplasty. Atherosclerotic calcifications Electronically  Signed   By: Corrie Mckusick D.O.   On: 08/07/2016 13:09    EKG:   Orders placed or performed during the hospital encounter of 12/05/15  . EKG 12-Lead  . EKG 12-Lead    IMPRESSION AND PLAN:  Principal Problem:   Rib fracture - from his fall the other day, continue with pain control as possible, incentive spirometry Active Problems:   Constipation - likely due to pain medications the patient is an, oriented to very least exacerbated by the same. We will continue stool softening and promotility agents, as well as repeat enema as needed   Diabetes (HCC) - sliding scale insulin with corresponding glucose checks   Anxiety - continue home meds   A-fib ([place]) - continue home rate controlling medications as well as anticoagulation   GERD (gastroesophageal reflux disease) - home dose PPI  All the records are reviewed and case discussed with ED provider. Management plans discussed with the patient and/or family.  DVT PROPHYLAXIS: Systemic anticoagulation  GI PROPHYLAXIS: PPI  ADMISSION STATUS: Observation  CODE STATUS: Full Code Status History    Date Active Date Inactive Code Status Order ID Comments User Context   12/05/2015 11:42 PM 12/06/2015 12:59 PM Full Code [id number]  Lance Coon, MD Inpatient   06/18/2015  5:01 PM 06/20/2015  7:52 PM Full Code [id number]  Epifanio Lesches, MD ED      TOTAL TIME TAKING CARE OF THIS PATIENT: 40 minutes.   Jannifer Franklin, Wallis Spizzirri FIELDING 08/08/2016, 8:45 PM  [facility]  Office  [phone]  CC: Primary care physician; Albina Billet, MD  Note:  This document was prepared using Dragon voice recognition software and may include unintentional dictation errors.

## 2016-08-08 NOTE — ED Notes (Signed)
Assisted pt. To toilet in room.  Pt. Advised to pull call bell when ready to get off toilet.

## 2016-08-09 DIAGNOSIS — S2241XA Multiple fractures of ribs, right side, initial encounter for closed fracture: Secondary | ICD-10-CM | POA: Diagnosis not present

## 2016-08-09 LAB — GLUCOSE, CAPILLARY
GLUCOSE-CAPILLARY: 178 mg/dL — AB (ref 65–99)
GLUCOSE-CAPILLARY: 208 mg/dL — AB (ref 65–99)
GLUCOSE-CAPILLARY: 145 mg/dL — AB (ref 65–99)
Glucose-Capillary: 232 mg/dL — ABNORMAL HIGH (ref 65–99)

## 2016-08-09 LAB — BASIC METABOLIC PANEL
ANION GAP: 6 (ref 5–15)
BUN: 20 mg/dL (ref 6–20)
CO2: 31 mmol/L (ref 22–32)
Calcium: 9.6 mg/dL (ref 8.9–10.3)
Chloride: 103 mmol/L (ref 101–111)
Creatinine, Ser: 0.99 mg/dL (ref 0.61–1.24)
GLUCOSE: 149 mg/dL — AB (ref 65–99)
POTASSIUM: 4 mmol/L (ref 3.5–5.1)
Sodium: 140 mmol/L (ref 135–145)

## 2016-08-09 LAB — CBC
HEMATOCRIT: 40.6 % (ref 40.0–52.0)
HEMOGLOBIN: 14 g/dL (ref 13.0–18.0)
MCH: 31.9 pg (ref 26.0–34.0)
MCHC: 34.4 g/dL (ref 32.0–36.0)
MCV: 92.7 fL (ref 80.0–100.0)
Platelets: 160 10*3/uL (ref 150–440)
RBC: 4.38 MIL/uL — AB (ref 4.40–5.90)
RDW: 17.1 % — ABNORMAL HIGH (ref 11.5–14.5)
WBC: 8.3 10*3/uL (ref 3.8–10.6)

## 2016-08-09 MED ORDER — CYCLOBENZAPRINE HCL 10 MG PO TABS
10.0000 mg | ORAL_TABLET | Freq: Three times a day (TID) | ORAL | Status: DC | PRN
  Administered 2016-08-09: 10 mg via ORAL
  Filled 2016-08-09: qty 1

## 2016-08-09 MED ORDER — MAGNESIUM CITRATE PO SOLN
1.0000 | Freq: Once | ORAL | Status: AC
  Administered 2016-08-09: 1 via ORAL
  Filled 2016-08-09: qty 296

## 2016-08-09 MED ORDER — BISACODYL 10 MG RE SUPP
10.0000 mg | Freq: Once | RECTAL | Status: AC
  Administered 2016-08-09: 10 mg via RECTAL
  Filled 2016-08-09: qty 1

## 2016-08-09 MED ORDER — LACTULOSE 10 GM/15ML PO SOLN
30.0000 g | Freq: Three times a day (TID) | ORAL | Status: DC
  Administered 2016-08-09: 30 g via ORAL
  Filled 2016-08-09: qty 60

## 2016-08-09 MED ORDER — CYCLOBENZAPRINE HCL 10 MG PO TABS
10.0000 mg | ORAL_TABLET | Freq: Once | ORAL | Status: AC
  Administered 2016-08-09: 10 mg via ORAL
  Filled 2016-08-09: qty 1

## 2016-08-09 MED ORDER — CYCLOBENZAPRINE HCL 10 MG PO TABS
10.0000 mg | ORAL_TABLET | Freq: Every day | ORAL | Status: DC
  Administered 2016-08-09: 10 mg via ORAL
  Filled 2016-08-09: qty 1

## 2016-08-09 NOTE — Progress Notes (Signed)
[facility] [place] NAME: Sean Armstrong    MR#:  [id number]  DATE OF BIRTH:  [birth year]  SUBJECTIVE:   Came in with abd pain and constipation REVIEW OF SYSTEMS:   Review of Systems  Constitutional: Negative for chills, fever and weight loss.  HENT: Negative for ear discharge, ear pain and nosebleeds.   Eyes: Negative for blurred vision, pain and discharge.  Respiratory: Negative for sputum production, shortness of breath, wheezing and stridor.   Cardiovascular: Negative for chest pain, palpitations, orthopnea and PND.  Gastrointestinal: Positive for constipation. Negative for abdominal pain, diarrhea, nausea and vomiting.  Genitourinary: Negative for frequency and urgency.  Musculoskeletal: Negative for back pain and joint pain.  Neurological: Positive for weakness. Negative for sensory change, speech change and focal weakness.  Psychiatric/Behavioral: Negative for depression and hallucinations. The patient is not nervous/anxious.    Tolerating Diet:yes Tolerating PT: pending  DRUG ALLERGIES:   Allergies  Allergen Reactions  . Gentamycin [Gentamicin] Other (See Comments)    Reaction:  Caused kidney issues for pt   . Iodinated Diagnostic Agents     Other reaction(s): Other (See Comments) "Burning sensation throughout whole body"  . Meloxicam     Other reaction(s): Other (See Comments) GI UPSET  . Metrizamide     Other reaction(s): Other (See Comments) "Burning sensation throughout whole body"  . Penicillin V Potassium     Other reaction(s): Other (See Comments) No effect with ampicillin  . Celecoxib Nausea Only    Other reaction(s): Other (See Comments) GI upset  . Ciprofloxacin Nausea Only  . Hydrocodone Nausea Only  . Propranolol Palpitations    VITALS:  Blood pressure (!) 173/80, pulse 70, temperature 98 F (36.7 C), temperature source Oral, resp. rate 18, height 5\' 9"  (1.753 m), weight 88.5 kg (195 lb), SpO2 97  %.  PHYSICAL EXAMINATION:   Physical Exam  GENERAL:  76 y.o.-year-old patient lying in the bed with no acute distress.  EYES: Pupils equal, round, reactive to light and accommodation. No scleral icterus. Extraocular muscles intact.  HEENT: Head atraumatic, normocephalic. Oropharynx and nasopharynx clear.  NECK:  Supple, no jugular venous distention. No thyroid enlargement, no tenderness.  LUNGS: Normal breath sounds bilaterally, no wheezing, rales, rhonchi. No use of accessory muscles of respiration.  CARDIOVASCULAR: S1, S2 normal. No murmurs, rubs, or gallops.  ABDOMEN: Soft, nontender, nondistended. Bowel sounds present. No organomegaly or mass.  EXTREMITIES: No cyanosis, clubbing or edema b/l.    NEUROLOGIC: Cranial nerves II through XII are intact. No focal Motor or sensory deficits b/l.   PSYCHIATRIC:  patient is alert and oriented x 3.  SKIN: No obvious rash, lesion, or ulcer.   LABORATORY PANEL:  CBC  Recent Labs Lab 08/09/16 0434  WBC 8.3  HGB 14.0  HCT 40.6  PLT 160    Chemistries   Recent Labs Lab 08/08/16 1513 08/09/16 0434  NA 136 140  K 4.5 4.0  CL 100* 103  CO2 29 31  GLUCOSE 200* 149*  BUN 22* 20  CREATININE 1.19 0.99  CALCIUM 9.9 9.6  AST 28  --   ALT 13*  --   ALKPHOS 49  --   BILITOT 1.3*  --    Cardiac Enzymes No results for input(s): TROPONINI in the last 168 hours. RADIOLOGY:  Ct Abdomen Pelvis Wo Contrast  Result Date: 08/08/2016 CLINICAL DATA:  Constipation and diffuse abdominal pain. Recent fall. EXAM: CT ABDOMEN AND PELVIS WITHOUT CONTRAST TECHNIQUE:  Multidetector CT imaging of the abdomen and pelvis was performed following the standard protocol without IV contrast. COMPARISON:  CT abdomen pelvis 06/20/2016 FINDINGS: Lower chest: Small right pleural effusion. Hepatobiliary: Normal hepatic size and contours. No perihepatic ascites. No intra- or extrahepatic biliary dilatation. Gallbladder surgically absent. Pancreas: Unchanged appearance  of 1.8 cm cystic lesion in the pancreatic tail. No peripancreatic fluid collection or pancreatic ductal dilatation. Spleen: Normal. Adrenals/Urinary Tract: Normal adrenal glands. There are multiple bilateral renal cysts of varying densities that are unchanged from the examination of 06/20/2016. Calcification of the right lower pole is unchanged. No hydronephrosis. No ureteral obstruction. Stomach/Bowel: There is no hiatal hernia. The stomach and duodenum are normal. There is no dilated small bowel or enteric inflammation. There is no colonic abnormality. The appendix is normal. Vascular/Lymphatic: Infrarenal abdominal aortic aneurysm measures 4.1 cm, unchanged. There is atherosclerotic calcification throughout the aorta and its branch vessels. No abdominal or pelvic adenopathy. Reproductive: The prostate is enlarged, measuring 5.6 cm transverse. 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 There is multilevel lumbar facet arthrosis and osteophytosis. No bony spinal canal stenosis. There is a fracture of the right ninth rib. Normal visualized extrathoracic and extraperitoneal soft tissues. Other: No contributory non-categorized findings. IMPRESSION: 1. No acute abnormality of the abdomen or pelvis. 2. Unchanged infrarenal abdominal aortic aneurysm. Recommend followup by ultrasound in 1 year. This recommendation follows ACR consensus guidelines: White Paper of the ACR Incidental Findings Committee II on Vascular Findings. J Am Coll Radiol 2013; 10:789-794. 3. Aortic atherosclerosis. 4. Minimally displaced right ninth rib fracture. 5. 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 Recommend follow up pre and post contrast MRI/MRCP or pancreatic protocol CT in 12 months. This recommendation follows ACR consensus guidelines: Management of Incidental Pancreatic Cysts: A White Paper of the ACR Incidental Findings Committee. [place] 6063;01:601-093. Electronically Signed   By: Ulyses Jarred M.D.   On:  08/08/2016 18:20   Dg Abd 2 Views  Result Date: 08/07/2016 CLINICAL DATA:  Abdominal pain. Constipation. Back pain after a fall 2 days ago. EXAM: ABDOMEN - 2 VIEW COMPARISON:  CT abdomen and pelvis 06/20/2016 FINDINGS: Cholecystectomy clips are noted. There is no evidence of intraperitoneal free air. A moderate amount of stool is present in the colon. No dilated loops of bowel are seen to suggest obstruction. Thoracolumbar spondylosis and prior right hip arthroplasty are noted. IMPRESSION: Moderate volume of colonic stool without evidence of obstruction. Electronically Signed   By: Logan Bores M.D.   On: 08/07/2016 14:38   Dg Abdomen Acute W/chest  Result Date: 08/08/2016 CLINICAL DATA:  Right upper quadrant abdominal pain since a fall 3 days ago. EXAM: DG ABDOMEN ACUTE W/ 1V CHEST COMPARISON:  Radiographs dated 08/07/2016 FINDINGS: There are now visible slightly displaced fractures of the posterolateral aspects of the right eighth and ninth ribs. There is no pneumothorax or lung contusion or pleural effusion. Heart size and pulmonary vascularity are normal. Bowel gas pattern is normal.  Prior cholecystectomy. IMPRESSION: There are slightly displaced fractures of the right eighth and ninth ribs posterolaterally. Otherwise benign appearing abdomen and chest. Electronically Signed   By: Lorriane Shire M.D.   On: 08/08/2016 14:49   ASSESSMENT AND PLAN:   Giles Currie  is a 76 y.o. male who presents with Pain from rib fracture after a fall, and significant constipation. Patient fell a few days ago, and fractured his ribs. He was artery on some pain medication prior to this for back pain, and  received quite a bit of pain medication over the last few days due to the fall and rib fracture. He presents again today with continued rib pain, and significant constipation  * Rib fracture - from his fall the other day, continue with pain control as possible, incentive spirometry  *Constipation - likely due to  pain medications the patient  -mag citrate and lactulose till pt gets BM  * Diabetes ([place]) - sliding scale insulin with corresponding glucose checks   * Anxiety - continue home meds  *A-fib ([place]) - continue home rate controlling medications as well as anticoagulation  * GERD (gastroesophageal reflux disease) - home dose PPI  Case discussed with Care Management/Social Worker. Management plans discussed with the patient, family and they are in agreement.  CODE STATUS: FULL  DVT Prophylaxis: eliquis  TOTAL TIME TAKING CARE OF THIS PATIENT: *25* minutes.  >50% time spent on counselling and coordination of care  POSSIBLE D/C IN *1 * DAYS, DEPENDING ON CLINICAL CONDITION.  Note: This dictation was prepared with Dragon dictation along with smaller phrase technology. Any transcriptional errors that result from this process are unintentional.  Danial Hlavac M.D on 08/09/2016 at 2:30 PM  Between 7am to 6pm - Pager - [phone]  After 6pm go to [email or web address] - password [email or web address] [facility]  Office  [phone]  CC: Primary care physician; Albina Billet, MD

## 2016-08-09 NOTE — Care Management Obs Status (Signed)
[facility] [place] NOTIFICATION   Patient Details  Name: LINLEY MOXLEY MRN: [id number] Date of Birth: Nov 30, 1940   Medicare Observation Status Notification Given:  Yes ([place] letter)    Mardene Speak, RN 08/09/2016, 2:21 PM

## 2016-08-09 NOTE — Progress Notes (Signed)
Patient had large bowel movement after suppository. Spoke with Dr Posey Pronto, patient will discharge home tomorrow.

## 2016-08-09 NOTE — Progress Notes (Signed)
Physical Therapy Evaluation Patient Details Name: Sean Armstrong MRN: [id number] DOB: [birth year] Today's Date: 08/09/2016   History of Present Illness  Patient is a 76 y.o. male admitted on 25 May with constipation after fall with R T8-9 rib fx on 21 May. PMH includes DM, anxiety, GERD, atrial fibrillation, and Parkinson's Disease.  Clinical Impression  Patient is a pleasant male admitted for above listed reasons. Patient previously modified independent with RW at home with intermittent assistance from son who lives close. Patient was also receiving HHPT prior to [place]. Patient on evaluation demonstrates need for minimal assistance with bed mobility, transfers, and gait; he required moderate verbal/tactile cues for proper use of AD. Patient will benefit from skilled PT to improve muscle strength and functional mobility to prevent falls in future. It is believed he will require HHPT as well as supervision for OOB mobility upon d/c.    Follow Up Recommendations Home health PT;Supervision for mobility/OOB    Equipment Recommendations  None recommended by PT    Recommendations for Other Services       Precautions / Restrictions Precautions Precautions: Fall Restrictions Weight Bearing Restrictions: No      Mobility  Bed Mobility Overal bed mobility: Needs Assistance Bed Mobility: Supine to Sit;Sit to Supine     Supine to sit: Min assist Sit to supine: Min assist   General bed mobility comments: Patient unable to get to EOB on R. Required minimal assistance to move from supine to sit and sit to supine from L side of bed.  Transfers Overall transfer level: Needs assistance Equipment used: Rolling walker (2 wheeled) Transfers: Sit to/from Stand Sit to Stand: Min assist         General transfer comment: Patient requires minimal assistance to move from sit to stand.  Ambulation/Gait Ambulation/Gait assistance: Min guard Ambulation Distance (Feet): 70 Feet Assistive  device: Rolling walker (2 wheeled)       General Gait Details: Patient ambulates with RW, tending to push too far forward, demonstrating decreased dorsiflexion. Patient required verbal/tactile cues for improved safety awareness.  Stairs            Wheelchair Mobility    Modified Rankin (Stroke Patients Only)       Balance Overall balance assessment: Needs assistance;History of Falls Sitting-balance support: Feet supported Sitting balance-Leahy Scale: Good     Standing balance support: Bilateral upper extremity supported Standing balance-Leahy Scale: Fair                               Pertinent Vitals/Pain Pain Assessment: Faces Faces Pain Scale: Hurts little more Pain Location: R ribs Pain Descriptors / Indicators: Aching Pain Intervention(s): Limited activity within patient's tolerance;Monitored during session    [place] expects to be discharged to:: Private residence Living Arrangements: Spouse/significant other Available Help at Discharge: Family;Available PRN/intermittently Type of Home: House Home Access: Ramped entrance     Home Layout: One level Home Equipment: Walker - 2 wheels;Shower seat      Prior Function Level of Independence: Independent with assistive device(s);Needs assistance   Gait / Transfers Assistance Needed: Performed with RW  ADL's / Homemaking Assistance Needed: Son assists with groceries; wife s/p THA and unable to assist        Hand Dominance        Extremity/Trunk Assessment   Upper Extremity Assessment Upper Extremity Assessment: Generalized weakness    Lower Extremity Assessment Lower Extremity Assessment: Generalized weakness  Communication   Communication: No difficulties  Cognition Arousal/Alertness: Awake/alert Behavior During Therapy: WFL for tasks assessed/performed Overall Cognitive Status: Within Functional Limits for tasks assessed                                         General Comments      Exercises     Assessment/Plan    PT Assessment Patient needs continued PT services  PT Problem List Decreased strength;Decreased activity tolerance;Decreased balance;Decreased mobility;Decreased knowledge of use of DME;Decreased safety awareness;Pain       PT Treatment Interventions DME instruction;Gait training;Stair training;Functional mobility training;Therapeutic exercise;Therapeutic activities;Balance training;Patient/family education    PT Goals (Current goals can be found in the Care Plan section)  Acute Rehab PT Goals Patient Stated Goal: "To have less pain" PT Goal Formulation: With patient/family Time For Goal Achievement: 08/23/16 Potential to Achieve Goals: Good    Frequency Min 2X/week   Barriers to discharge Decreased caregiver support      Co-evaluation               AM-PAC PT "6 Clicks" Daily Activity  Outcome Measure Difficulty turning over in bed (including adjusting bedclothes, sheets and blankets)?: A Little Difficulty moving from lying on back to sitting on the side of the bed? : A Little Difficulty sitting down on and standing up from a chair with arms (e.g., wheelchair, bedside commode, etc,.)?: A Little Help needed moving to and from a bed to chair (including a wheelchair)?: A Little Help needed walking in hospital room?: A Little Help needed climbing 3-5 steps with a railing? : A Lot 6 Click Score: 17    End of Session Equipment Utilized During Treatment: Gait belt Activity Tolerance: Patient tolerated treatment well;Patient limited by fatigue Patient left: in bed;with bed alarm set;with call bell/phone within reach;with family/visitor present   PT Visit Diagnosis: Unsteadiness on feet (R26.81);History of falling (Z91.81);Pain Pain - Right/Left: Right Pain - part of body: Shoulder    Time: 1520-1540 PT Time Calculation (min) (ACUTE ONLY): 20 min   Charges:   PT Evaluation $PT Eval Low  Complexity: 1 Procedure     PT G Codes:   PT G-Codes **NOT FOR INPATIENT CLASS** Functional Assessment Tool Used: AM-PAC 6 Clicks Basic Mobility;Clinical judgement Functional Limitation: Mobility: Walking and moving around Mobility: Walking and Moving Around Current Status (F5379): At least 40 percent but less than 60 percent impaired, limited or restricted Mobility: Walking and Moving Around Goal Status [phone]): At least 20 percent but less than 40 percent impaired, limited or restricted      Dorice Lamas, PT, DPT 08/09/2016, 3:02 PM

## 2016-08-10 DIAGNOSIS — S2241XA Multiple fractures of ribs, right side, initial encounter for closed fracture: Secondary | ICD-10-CM | POA: Diagnosis not present

## 2016-08-10 LAB — GLUCOSE, CAPILLARY: GLUCOSE-CAPILLARY: 115 mg/dL — AB (ref 65–99)

## 2016-08-10 MED ORDER — INSULIN DETEMIR 100 UNIT/ML FLEXPEN: 30.0000 [IU] | PEN_INJECTOR | Freq: Every day | SUBCUTANEOUS | 11 refills | Status: DC

## 2016-08-10 NOTE — Progress Notes (Signed)
Patient is alert and oriented and able to verbalize needs. No complaints at this time. Vital signs stable. PIV removed.  Son at bedside. Discharge instructions gone over with patient at this time. Printed AVS given to patient. Patient and son verbalizes understanding of all discharge instructions and follow up care. No concerns voiced at this time. Belongings packed up. Son will transport patient home.   Bethann Punches, RN

## 2016-08-10 NOTE — Discharge Summary (Signed)
[facility] [place] NAME: Sean Armstrong    MR#:  [id number]  DATE OF BIRTH:  Aug 14, 1940  DATE OF ADMISSION:  08/08/2016 ADMITTING PHYSICIAN: Lance Coon, MD  DATE OF DISCHARGE: 08/10/2016  PRIMARY CARE PHYSICIAN: Albina Billet, MD    ADMISSION DIAGNOSIS:  Closed fracture of multiple ribs of right side, initial encounter [S22.41XA] Constipation, unspecified constipation type [K59.00]  DISCHARGE DIAGNOSIS:  Rib fracture s/p fall few days ago Constipation- relieved  SECONDARY DIAGNOSIS:   Past Medical History:  Diagnosis Date  . Anemia   . Colon polyp   . Coronary artery disease   . Diabetes mellitus without complication ([place])   . FHx: SVT (supraventricular tachycardia) 1998  . GERD (gastroesophageal reflux disease)   . History of DVT (deep vein thrombosis) 2009  . History of pulmonary embolism 08/2008  . Hyperlipidemia   . Hypertension   . Hypothyroidism   . Lumbar disc disease   . MGUS (monoclonal gammopathy of unknown significance)   . Myocardial infarction ([place])   . Nephrolithiasis   . Osteoarthritis   . Peptic ulcer disease   . Renal insufficiency   . Rheumatoid arthritis [facility])     HOSPITAL COURSE:   [name] a 76 y.o.malewho presents with Pain from rib fracture after a fall, and significant constipation. Patient fell a few days ago, and fractured his ribs. He was artery on some pain medication prior to this for back pain, and received quite a bit of pain medication over the last few days due to the fall and rib fracture. He presents again today with continued rib pain, and significant constipation  * Rib fracture - from his fall few days ago - continue with pain control as possible, incentive spirometry  *Constipation - likely due to pain medications the patient --now relieved. Had several BM's yday -received mag citrate and lactulose   *Diabetes ([place]) - sliding scale insulin with corresponding  glucose checks decreased Lantus to 30 units  *Anxiety - continue home meds  *A-fib ([place]) - continue home rate controlling medications as well as anticoagulation  *GERD (gastroesophageal reflux disease) - home dose PPI  Overall at baseline d/c home CONSULTS OBTAINED:    DRUG ALLERGIES:   Allergies  Allergen Reactions  . Gentamycin [Gentamicin] Other (See Comments)    Reaction:  Caused kidney issues for pt   . Iodinated Diagnostic Agents     Other reaction(s): Other (See Comments) "Burning sensation throughout whole body"  . Meloxicam     Other reaction(s): Other (See Comments) GI UPSET  . Metrizamide     Other reaction(s): Other (See Comments) "Burning sensation throughout whole body"  . Penicillin V Potassium     Other reaction(s): Other (See Comments) No effect with ampicillin  . Celecoxib Nausea Only    Other reaction(s): Other (See Comments) GI upset  . Ciprofloxacin Nausea Only  . Hydrocodone Nausea Only  . Propranolol Palpitations    DISCHARGE MEDICATIONS:   Current Discharge Medication List    START taking these medications   Details  oxyCODONE-acetaminophen (PERCOCET) 5-325 MG tablet Take 1-2 tablets by mouth every 6 (six) hours as needed for moderate pain or severe pain. Qty: 20 tablet, Refills: 0      CONTINUE these medications which have CHANGED   Details  Insulin Detemir (LEVEMIR FLEXTOUCH) 100 UNIT/ML Pen Inject 30 Units into the skin at bedtime. Qty: 15 mL, Refills: 11      CONTINUE these medications which have  NOT CHANGED   Details  apixaban (ELIQUIS) 5 MG TABS tablet Take 1 tablet (5 mg total) by mouth 2 (two) times daily. Qty: 60 tablet, Refills: 0    atenolol (TENORMIN) 25 MG tablet Take 0.5 tablets (12.5 mg total) by mouth daily. Qty: 30 tablet, Refills: 0    atorvastatin (LIPITOR) 80 MG tablet Take 1 tablet (80 mg total) by mouth daily at 6 PM. Qty: 30 tablet, Refills: 0    carbidopa-levodopa (PARCOPA) 25-100 MG disintegrating  tablet Take 1 tablet by mouth 3 (three) times daily.     cetirizine (ZYRTEC) 10 MG tablet Take 10 mg by mouth daily.    fluticasone (FLONASE) 50 MCG/ACT nasal spray Place 2 sprays into both nostrils daily.    folic acid (FOLVITE) 1 MG tablet Take 1 mg by mouth daily.    gabapentin (NEURONTIN) 400 MG capsule Take 400 mg by mouth daily.     glimepiride (AMARYL) 2 MG tablet Take 2 mg by mouth 2 (two) times daily.     loperamide (IMODIUM) 2 MG capsule Take 1 capsule (2 mg total) by mouth every 4 (four) hours as needed for diarrhea or loose stools. Qty: 20 capsule, Refills: 0    methotrexate (RHEUMATREX) 2.5 MG tablet Take 12.5 mg by mouth once a week. Pt takes on Wednesday.  Caution:Chemotherapy. Protect from light.    Multiple Vitamins-Minerals (CENTRUM SILVER PO) Take 1 tablet by mouth daily.    Associated Diagnoses: MGUS (monoclonal gammopathy of unknown significance)    omeprazole (PRILOSEC) 20 MG capsule Take 40 mg by mouth daily.    potassium chloride SA (K-DUR,KLOR-CON) 20 MEQ tablet Take 40 mEq by mouth daily.     predniSONE (DELTASONE) 5 MG tablet Take 5 mg by mouth daily with breakfast.    tamsulosin (FLOMAX) 0.4 MG CAPS capsule Take 0.4 mg by mouth daily after breakfast.     triamterene-hydrochlorothiazide (MAXZIDE) 75-50 MG tablet Take 1 tablet by mouth daily.    LORazepam (ATIVAN) 0.5 MG tablet Take 0.5 mg by mouth 2 (two) times daily as needed for anxiety.     polyethylene glycol powder (GLYCOLAX/MIRALAX) powder 2 cap fulls in a full glass of water, three times a day, for 5 days. Qty: 255 g, Refills: 0    senna-docusate (SENOKOT-S) 8.6-50 MG tablet Take 2 tablets by mouth 2 (two) times daily. Qty: 120 tablet, Refills: 0    traMADol (ULTRAM) 50 MG tablet Take 50 mg by mouth every 6 (six) hours as needed.    Associated Diagnoses: MGUS (monoclonal gammopathy of unknown significance)      STOP taking these medications     sucralfate (CARAFATE) 1 g tablet          If you experience worsening of your admission symptoms, develop shortness of breath, life threatening emergency, suicidal or homicidal thoughts you must seek medical attention immediately by calling 911 or calling your MD immediately  if symptoms less severe.  You Must read complete instructions/literature along with all the possible adverse reactions/side effects for all the Medicines you take and that have been prescribed to you. Take any new Medicines after you have completely understood and accept all the possible adverse reactions/side effects.   Please note  You were cared for by a hospitalist during your hospital stay. If you have any questions about your discharge medications or the care you received while you were in the hospital after you are discharged, you can call the unit and asked to speak with the hospitalist on  call if the hospitalist that took care of you is not available. Once you are discharged, your primary care physician will handle any further medical issues. Please note that NO REFILLS for any discharge medications will be authorized once you are discharged, as it is imperative that you return to your primary care physician (or establish a relationship with a primary care physician if you do not have one) for your aftercare needs so that they can reassess your need for medications and monitor your lab values. Today   SUBJECTIVE    Doing well VITAL SIGNS:  Blood pressure 132/75, pulse 71, temperature 98.6 F (37 C), temperature source Oral, resp. rate 16, height 5\' 9"  (1.753 m), weight 88.5 kg (195 lb), SpO2 98 %.  I/O:   Intake/Output Summary (Last 24 hours) at 08/10/16 0754 Last data filed at 08/09/16 1835  Gross per 24 hour  Intake              120 ml  Output                0 ml  Net              120 ml    PHYSICAL EXAMINATION:  GENERAL:  76 y.o.-year-old patient lying in the bed with no acute distress.  EYES: Pupils equal, round, reactive to light and  accommodation. No scleral icterus. Extraocular muscles intact.  HEENT: Head atraumatic, normocephalic. Oropharynx and nasopharynx clear.  NECK:  Supple, no jugular venous distention. No thyroid enlargement, no tenderness.  LUNGS: Normal breath sounds bilaterally, no wheezing, rales,rhonchi or crepitation. No use of accessory muscles of respiration.  CARDIOVASCULAR: S1, S2 normal. No murmurs, rubs, or gallops.  ABDOMEN: Soft, non-tender, non-distended. Bowel sounds present. No organomegaly or mass.  EXTREMITIES: No pedal edema, cyanosis, or clubbing.  NEUROLOGIC: Cranial nerves II through XII are intact. Muscle strength 5/5 in all extremities. Sensation intact. Gait not checked.  PSYCHIATRIC: The patient is alert and oriented x 3.  SKIN: No obvious rash, lesion, or ulcer.   DATA REVIEW:   CBC   Recent Labs Lab 08/09/16 0434  WBC 8.3  HGB 14.0  HCT 40.6  PLT 160    Chemistries   Recent Labs Lab 08/08/16 1513 08/09/16 0434  NA 136 140  K 4.5 4.0  CL 100* 103  CO2 29 31  GLUCOSE 200* 149*  BUN 22* 20  CREATININE 1.19 0.99  CALCIUM 9.9 9.6  AST 28  --   ALT 13*  --   ALKPHOS 49  --   BILITOT 1.3*  --     Microbiology Results   No results found for this or any previous visit (from the past 240 hour(s)).  RADIOLOGY:  Ct Abdomen Pelvis Wo Contrast  Result Date: 08/08/2016 CLINICAL DATA:  Constipation and diffuse abdominal pain. Recent fall. EXAM: CT ABDOMEN AND PELVIS WITHOUT CONTRAST TECHNIQUE: Multidetector CT imaging of the abdomen and pelvis was performed following the standard protocol without IV contrast. COMPARISON:  CT abdomen pelvis 06/20/2016 FINDINGS: Lower chest: Small right pleural effusion. Hepatobiliary: Normal hepatic size and contours. No perihepatic ascites. No intra- or extrahepatic biliary dilatation. Gallbladder surgically absent. Pancreas: Unchanged appearance of 1.8 cm cystic lesion in the pancreatic tail. No peripancreatic fluid collection or  pancreatic ductal dilatation. Spleen: Normal. Adrenals/Urinary Tract: Normal adrenal glands. There are multiple bilateral renal cysts of varying densities that are unchanged from the examination of 06/20/2016. Calcification of the right lower pole is unchanged. No hydronephrosis. No ureteral obstruction.  Stomach/Bowel: There is no hiatal hernia. The stomach and duodenum are normal. There is no dilated small bowel or enteric inflammation. There is no colonic abnormality. The appendix is normal. Vascular/Lymphatic: Infrarenal abdominal aortic aneurysm measures 4.1 cm, unchanged. There is atherosclerotic calcification throughout the aorta and its branch vessels. No abdominal or pelvic adenopathy. Reproductive: The prostate is enlarged, measuring 5.6 cm transverse. 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 There is multilevel lumbar facet arthrosis and osteophytosis. No bony spinal canal stenosis. There is a fracture of the right ninth rib. Normal visualized extrathoracic and extraperitoneal soft tissues. Other: No contributory non-categorized findings. IMPRESSION: 1. No acute abnormality of the abdomen or pelvis. 2. Unchanged infrarenal abdominal aortic aneurysm. Recommend followup by ultrasound in 1 year. This recommendation follows ACR consensus guidelines: White Paper of the ACR Incidental Findings Committee II on Vascular Findings. J Am Coll Radiol 2013; 10:789-794. 3. Aortic atherosclerosis. 4. Minimally displaced right ninth rib fracture. 5. 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 Recommend follow up pre and post contrast MRI/MRCP or pancreatic protocol CT in 12 months. This recommendation follows ACR consensus guidelines: Management of Incidental Pancreatic Cysts: A White Paper of the ACR Incidental Findings Committee. [place] 6759;16:384-665. Electronically Signed   By: Ulyses Jarred M.D.   On: 08/08/2016 18:20   Dg Abdomen Acute W/chest  Result Date: 08/08/2016 CLINICAL  DATA:  Right upper quadrant abdominal pain since a fall 3 days ago. EXAM: DG ABDOMEN ACUTE W/ 1V CHEST COMPARISON:  Radiographs dated 08/07/2016 FINDINGS: There are now visible slightly displaced fractures of the posterolateral aspects of the right eighth and ninth ribs. There is no pneumothorax or lung contusion or pleural effusion. Heart size and pulmonary vascularity are normal. Bowel gas pattern is normal.  Prior cholecystectomy. IMPRESSION: There are slightly displaced fractures of the right eighth and ninth ribs posterolaterally. Otherwise benign appearing abdomen and chest. Electronically Signed   By: Lorriane Shire M.D.   On: 08/08/2016 14:49     Management plans discussed with the patient, family and they are in agreement.  CODE STATUS:     Code Status Orders        Start     Ordered   08/08/16 2220  Full code  Continuous     08/08/16 2219    Code Status History    Date Active Date Inactive Code Status Order ID Comments User Context   12/05/2015 11:42 PM 12/06/2015 12:59 PM Full Code [id number]  Lance Coon, MD Inpatient   06/18/2015  5:01 PM 06/20/2015  7:52 PM Full Code [id number]  Epifanio Lesches, MD ED    Advance Directive Documentation     Most Recent Value  Type of Advance Directive  Healthcare Power of Attorney [[place]]  Pre-existing out of facility DNR order (yellow form or pink MOST form)  -  "MOST" Form in Place?  -      TOTAL TIME TAKING CARE OF THIS PATIENT: 40 minutes.    Margie Urbanowicz M.D on 08/10/2016 at 7:54 AM  Between 7am to 6pm - Pager - [phone] After 6pm go to [email or web address] - password [email or web address] [facility]  Office  [phone]  CC: Primary care physician; Albina Billet, MD

## 2016-08-10 NOTE — Care Management Note (Signed)
Case Management Note  Patient Details  Name: Sean Armstrong MRN: [id number] Date of Birth: Aug 13, 1940  Subjective/Objective:       Discussed discharge planning with son Octavia Bruckner and Mrs Ishmeal Rorie. Mr Marro is already open to [facility] for PT and a resumption of care was called to Judson Roch at [facility]. Mr Fleischer has a RW and a cane at home. No other DME needed per wife. Son Octavia Bruckner will transport Mr Minichiello home today.              Action/Plan:   Expected Discharge Date:  08/10/16               Expected Discharge Plan:   08/10/16  In-House Referral:     Discharge planning Services     Post Acute Care Choice:   yes Choice offered to:   son and wife  DME Arranged:   NA DME Agency:   NA  HH Arranged:   HH-PT HH Agency:    [facility] Status of Service:   Completed  If discussed at [place] of Stay Meetings, dates discussed:    Additional Comments:  Cynthis Purington A, RN 08/10/2016, 9:21 AM

## 2016-09-08 NOTE — Progress Notes (Signed)
Subjective:    Patient ID: Sean Armstrong, male    DOB: [birth year], 76 y.o.   MRN: [id number] Chief Complaint  Patient presents with  . Follow-up    Patient seen last week. She presents today to assess if there is any propagation of her DVT. Patient is relatively without complaint. She continues to experience right lower extremity discomfort and edema. She denies any shortness of breath or chest pain. She continues to take Eliquis on a daily basis. 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 Patient denies any fever, nausea or vomiting.     Review of Systems  Constitutional: Negative.   HENT: Negative.   Eyes: Negative.   Respiratory: Negative.   Cardiovascular: Positive for leg swelling.  Gastrointestinal: Negative.   Endocrine: Negative.   Genitourinary: Negative.   Musculoskeletal: Negative.   Skin: Negative.   Allergic/Immunologic: Negative.   Neurological: Negative.   Hematological: Negative.   Psychiatric/Behavioral: Negative.        Objective:   Physical Exam  Constitutional: He is oriented to person, place, and time. He appears well-developed and well-nourished. No distress.  HENT:  Head: Normocephalic and atraumatic.  Eyes: Conjunctivae are normal. Pupils are equal, round, and reactive to light.  Neck: Normal range of motion.  Cardiovascular: Normal rate, regular rhythm, normal heart sounds and intact distal pulses.   Pulses:      Radial pulses are 2+ on the right side, and 2+ on the left side.       Dorsalis pedis pulses are 2+ on the left side.       Posterior tibial pulses  are 2+ on the left side.  Unable to palpate pedal pulses due to right lower extremity edema however there is no acute compromise to the extremity and the right foot is warm  Pulmonary/Chest: Effort normal.  Musculoskeletal: Normal range of motion. He exhibits edema (Moderate right lower extremity edema).  Neurological: He is alert and oriented to person, place, and time.  Skin: Skin is warm and dry. He is not diaphoretic.  Psychiatric: He has a normal mood and affect. His behavior is normal. Judgment and thought content normal.  Vitals reviewed.   BP 126/71   Pulse 62   Resp 17   Wt 197 lb (89.4 kg)   BMI 29.09 kg/m   Past Medical History:  Diagnosis Date  . Anemia   . Colon polyp   . Coronary artery disease   . Diabetes mellitus without complication ([place])   . FHx: SVT (supraventricular tachycardia) 1998  . GERD (gastroesophageal reflux disease)   . History of DVT (deep vein thrombosis) 2009  . History of pulmonary embolism 08/2008  . Hyperlipidemia   . Hypertension   . Hypothyroidism   . Lumbar disc disease   . MGUS (monoclonal gammopathy of unknown significance)   . Myocardial infarction ([place])   . Nephrolithiasis   . Osteoarthritis   . Peptic ulcer disease   . Renal insufficiency   . Rheumatoid arthritis [facility])     Social History   Social History  . Marital status: Married    Spouse name: N/A  .  Number of children: N/A  . Years of education: N/A   Occupational History  . Not on file.   Social History Main Topics  . Smoking status: Former Smoker    Types: Cigarettes  . Smokeless tobacco: Current User    Types: Chew     Comment: quit smoking cigerates 1995  . Alcohol use No  . Drug use: No  . Sexual activity: Not on file   Other Topics Concern  . Not on file   Social History Narrative  . No narrative on file    Past Surgical History:  Procedure Laterality Date  . CARDIAC CATHETERIZATION    . CHOLECYSTECTOMY    . COLONOSCOPY  03/2007, 2006  .  CORONARY ARTERY BYPASS GRAFT    . ESOPHAGOGASTRODUODENOSCOPY (EGD) WITH PROPOFOL N/A 07/02/2015   Procedure: ESOPHAGOGASTRODUODENOSCOPY (EGD) WITH PROPOFOL;  Surgeon: Manya Silvas, MD;  Location: [facility] ENDOSCOPY;  Service: Endoscopy;  Laterality: N/A;  . EYE SURGERY    . JOINT REPLACEMENT    . REPLACEMENT TOTAL KNEE BILATERAL    . TOTAL HIP ARTHROPLASTY Right     Family History  Problem Relation Age of Onset  . Heart attack Father   . Diabetes Father   . Heart attack Brother     Allergies  Allergen Reactions  . Gentamycin [Gentamicin] Other (See Comments)    Reaction:  Caused kidney issues for pt   . Iodinated Diagnostic Agents     Other reaction(s): Other (See Comments) "Burning sensation throughout whole body"  . Meloxicam     Other reaction(s): Other (See Comments) GI UPSET  . Metrizamide     Other reaction(s): Other (See Comments) "Burning sensation throughout whole body"  . Penicillin V Potassium     Other reaction(s): Other (See Comments) No effect with ampicillin  . Celecoxib Nausea Only    Other reaction(s): Other (See Comments) GI upset  . Ciprofloxacin Nausea Only  . Hydrocodone Nausea Only  . Propranolol Palpitations       Assessment & Plan:  Patient seen last week. She presents today to assess if there is any propagation of her DVT. Patient is relatively without complaint. She continues to experience right lower extremity discomfort and edema. She denies any shortness of breath or chest pain. She continues to take Eliquis on a daily basis. 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 Patient denies any fever, nausea or vomiting.   1. Deep vein thrombosis (DVT) of popliteal vein of right lower extremity, unspecified chronicity (HCC) - stable There is no propagation of the patient's right lower extremity DVT on duplex. We discussed wearing compression stockings and elevating her right lower extremity for symptomatic relief. Patient to continue to take Eliquis twice a day Patient understands to seek medical attention immediately if she should experience any shortness of breath or chest pain. Patient to follow up in 3 months for repeat right lower extremity DVT study.  - VAS [place] LOWER EXTREMITY VENOUS (DVT); Future  2. Type 2 diabetes mellitus with complication, unspecified whether long term insulin use (HCC) - stable Encouraged good control as its slows the progression of atherosclerotic disease  3. Hyperlipidemia, unspecified hyperlipidemia type - stable Encouraged good control as its slows the progression of atherosclerotic disease   Current Outpatient Prescriptions on File Prior to Visit  Medication Sig Dispense Refill  . apixaban (ELIQUIS) 5 MG TABS tablet Take 1 tablet (5 mg total) by mouth 2 (two) times daily. 60 tablet 0  . atenolol (TENORMIN) 25 MG tablet Take 0.5 tablets (12.5 mg total) by mouth daily. 30 tablet 0  . atorvastatin (LIPITOR) 80 MG tablet Take 1 tablet (80 mg total) by mouth daily at 6 PM. 30 tablet 0  . carbidopa-levodopa (PARCOPA) 25-100 MG disintegrating tablet Take 1 tablet by mouth 3 (three) times daily.     . cetirizine (ZYRTEC) 10 MG tablet Take 10 mg by mouth daily.    . fluticasone (FLONASE) 50 MCG/ACT nasal spray Place 2 sprays into both nostrils daily.    . folic acid (FOLVITE) 1 MG tablet Take 1 mg by mouth daily.    Marland Kitchen gabapentin (NEURONTIN) 400 MG capsule Take 400 mg by mouth daily.     Marland Kitchen glimepiride (AMARYL) 2 MG tablet Take 2 mg by mouth 2 (two) times daily.     Marland Kitchen loperamide (IMODIUM) 2 MG capsule Take 1 capsule  (2 mg total) by mouth every 4 (four) hours as needed for diarrhea or loose stools. 20 capsule 0  . LORazepam (ATIVAN) 0.5 MG tablet Take 0.5 mg by mouth 2 (two) times daily as needed for anxiety.     . methotrexate (RHEUMATREX) 2.5 MG tablet Take 12.5 mg by mouth once a week. Pt takes on Wednesday.  Caution:Chemotherapy. Protect from light.    . Multiple Vitamins-Minerals (CENTRUM SILVER PO) Take 1 tablet by mouth daily.     Marland Kitchen omeprazole (PRILOSEC) 20 MG capsule Take 40 mg by mouth daily.    . potassium chloride SA (K-DUR,KLOR-CON) 20 MEQ tablet Take 40 mEq by mouth daily.     . predniSONE (DELTASONE) 5 MG tablet Take 5 mg by mouth daily with breakfast.    . tamsulosin (FLOMAX) 0.4 MG CAPS capsule Take 0.4 mg by mouth daily after breakfast.     . traMADol (ULTRAM) 50 MG tablet Take 50 mg by mouth every 6 (six) hours as needed.     . triamterene-hydrochlorothiazide (MAXZIDE) 75-50 MG tablet Take 1 tablet by mouth daily.     No current facility-administered medications on file prior to visit.     There are no Patient Instructions on file for this visit. No Follow-up on file.   Rhyen Mazariego A Daoud Lobue, PA-C

## 2016-09-25 ENCOUNTER — Other Ambulatory Visit (INDEPENDENT_AMBULATORY_CARE_PROVIDER_SITE_OTHER): Payer: Self-pay | Admitting: Vascular Surgery

## 2016-09-25 DIAGNOSIS — I70209 Unspecified atherosclerosis of native arteries of extremities, unspecified extremity: Secondary | ICD-10-CM

## 2016-09-25 DIAGNOSIS — I714 Abdominal aortic aneurysm, without rupture, unspecified: Secondary | ICD-10-CM

## 2016-09-25 DIAGNOSIS — M79606 Pain in leg, unspecified: Secondary | ICD-10-CM

## 2016-09-29 ENCOUNTER — Ambulatory Visit (INDEPENDENT_AMBULATORY_CARE_PROVIDER_SITE_OTHER): Payer: Medicare Other

## 2016-09-29 ENCOUNTER — Encounter (INDEPENDENT_AMBULATORY_CARE_PROVIDER_SITE_OTHER): Payer: Self-pay | Admitting: Vascular Surgery

## 2016-09-29 ENCOUNTER — Ambulatory Visit (INDEPENDENT_AMBULATORY_CARE_PROVIDER_SITE_OTHER): Payer: Medicare Other | Admitting: Vascular Surgery

## 2016-09-29 ENCOUNTER — Encounter (INDEPENDENT_AMBULATORY_CARE_PROVIDER_SITE_OTHER): Payer: Self-pay

## 2016-09-29 VITALS — BP 132/66 | HR 56 | Resp 16 | Wt 194.0 lb

## 2016-09-29 DIAGNOSIS — I70209 Unspecified atherosclerosis of native arteries of extremities, unspecified extremity: Secondary | ICD-10-CM | POA: Diagnosis not present

## 2016-09-29 DIAGNOSIS — I4891 Unspecified atrial fibrillation: Secondary | ICD-10-CM | POA: Diagnosis not present

## 2016-09-29 DIAGNOSIS — I714 Abdominal aortic aneurysm, without rupture, unspecified: Secondary | ICD-10-CM

## 2016-09-29 DIAGNOSIS — E118 Type 2 diabetes mellitus with unspecified complications: Secondary | ICD-10-CM

## 2016-09-29 DIAGNOSIS — I739 Peripheral vascular disease, unspecified: Secondary | ICD-10-CM

## 2016-09-29 DIAGNOSIS — I82431 Acute embolism and thrombosis of right popliteal vein: Secondary | ICD-10-CM | POA: Diagnosis not present

## 2016-09-29 DIAGNOSIS — M79606 Pain in leg, unspecified: Secondary | ICD-10-CM

## 2016-09-29 NOTE — Progress Notes (Signed)
MRN : [id number]  Sean Armstrong is a 76 y.o. (06-05-1940) male who presents with chief complaint of  Chief Complaint  Patient presents with  . ultrasound follow up  .  History of Present Illness:The patient returns to the office for surveillance of a known abdominal aortic aneurysm. Patient denies abdominal pain or back pain, no other abdominal complaints. No changes suggesting embolic episodes.   There have been no interval changes in the patient's overall health care since his last visit.  Patient denies amaurosis fugax or TIA symptoms. There is no history of claudication or rest pain symptoms of the lower extremities. The patient denies angina or shortness of breath.   Duplex US of the aorta and iliac arteries shows an AAA measured 4.3 cm.  Current Meds  Medication Sig  . apixaban (ELIQUIS) 5 MG TABS tablet Take 1 tablet (5 mg total) by mouth 2 (two) times daily.  Marland Kitchen atenolol (TENORMIN) 25 MG tablet Take 0.5 tablets (12.5 mg total) by mouth daily.  Marland Kitchen atorvastatin (LIPITOR) 80 MG tablet Take 1 tablet (80 mg total) by mouth daily at 6 PM.  . carbidopa-levodopa (PARCOPA) 25-100 MG disintegrating tablet Take 1 tablet by mouth 3 (three) times daily.   . cetirizine (ZYRTEC) 10 MG tablet Take 10 mg by mouth daily.  . fluticasone (FLONASE) 50 MCG/ACT nasal spray Place 2 sprays into both nostrils daily.  . folic acid (FOLVITE) 1 MG tablet Take 1 mg by mouth daily.  Marland Kitchen gabapentin (NEURONTIN) 400 MG capsule Take 400 mg by mouth daily.   Marland Kitchen glimepiride (AMARYL) 2 MG tablet Take 2 mg by mouth 2 (two) times daily.   . Insulin Detemir (LEVEMIR FLEXTOUCH) 100 UNIT/ML Pen Inject 30 Units into the skin at bedtime.  Marland Kitchen loperamide (IMODIUM) 2 MG capsule Take 1 capsule (2 mg total) by mouth every 4 (four) hours as needed for diarrhea or loose stools.  Marland Kitchen LORazepam (ATIVAN) 0.5 MG tablet Take 0.5 mg by mouth 2 (two) times daily as needed for anxiety.   . methotrexate (RHEUMATREX) 2.5 MG tablet Take 12.5  mg by mouth once a week. Pt takes on Wednesday.  Caution:Chemotherapy. Protect from light.  . Multiple Vitamins-Minerals (CENTRUM SILVER PO) Take 1 tablet by mouth daily.   Marland Kitchen omeprazole (PRILOSEC) 20 MG capsule Take 40 mg by mouth daily.  Marland Kitchen oxyCODONE-acetaminophen (PERCOCET) 5-325 MG tablet Take 1-2 tablets by mouth every 6 (six) hours as needed for moderate pain or severe pain.  . polyethylene glycol powder (GLYCOLAX/MIRALAX) powder 2 cap fulls in a full glass of water, three times a day, for 5 days.  . potassium chloride SA (K-DUR,KLOR-CON) 20 MEQ tablet Take 40 mEq by mouth daily.   . predniSONE (DELTASONE) 5 MG tablet Take 5 mg by mouth daily with breakfast.  . senna-docusate (SENOKOT-S) 8.6-50 MG tablet Take 2 tablets by mouth 2 (two) times daily.  . tamsulosin (FLOMAX) 0.4 MG CAPS capsule Take 0.4 mg by mouth daily after breakfast.   . traMADol (ULTRAM) 50 MG tablet Take 50 mg by mouth every 6 (six) hours as needed.   . triamterene-hydrochlorothiazide (MAXZIDE) 75-50 MG tablet Take 1 tablet by mouth daily.    Past Medical History:  Diagnosis Date  . Anemia   . Colon polyp   . Coronary artery disease   . Diabetes mellitus without complication ([place])   . FHx: SVT (supraventricular tachycardia) 1998  . GERD (gastroesophageal reflux disease)   . History of DVT (deep vein thrombosis) 2009  .  History of pulmonary embolism 08/2008  . Hyperlipidemia   . Hypertension   . Hypothyroidism   . Lumbar disc disease   . MGUS (monoclonal gammopathy of unknown significance)   . Myocardial infarction ([place])   . Nephrolithiasis   . Osteoarthritis   . Peptic ulcer disease   . Renal insufficiency   . Rheumatoid arthritis [facility])     Past Surgical History:  Procedure Laterality Date  . CARDIAC CATHETERIZATION    . CHOLECYSTECTOMY    . COLONOSCOPY  03/2007, 2006  . CORONARY ARTERY BYPASS GRAFT    . ESOPHAGOGASTRODUODENOSCOPY (EGD) WITH PROPOFOL N/A 07/02/2015   Procedure:  ESOPHAGOGASTRODUODENOSCOPY (EGD) WITH PROPOFOL;  Surgeon: Manya Silvas, MD;  Location: [facility] ENDOSCOPY;  Service: Endoscopy;  Laterality: N/A;  . EYE SURGERY    . JOINT REPLACEMENT    . REPLACEMENT TOTAL KNEE BILATERAL    . TOTAL HIP ARTHROPLASTY Right     Social History Social History  Substance Use Topics  . Smoking status: Former Smoker    Types: Cigarettes  . Smokeless tobacco: Current User    Types: Chew     Comment: quit smoking cigerates 1995  . Alcohol use No    Family History Family History  Problem Relation Age of Onset  . Heart attack Father   . Diabetes Father   . Heart attack Brother     Allergies  Allergen Reactions  . Gentamycin [Gentamicin] Other (See Comments)    Reaction:  Caused kidney issues for pt   . Iodinated Diagnostic Agents     Other reaction(s): Other (See Comments) "Burning sensation throughout whole body"  . Meloxicam     Other reaction(s): Other (See Comments) GI UPSET  . Metrizamide     Other reaction(s): Other (See Comments) "Burning sensation throughout whole body"  . Penicillin V Potassium     Other reaction(s): Other (See Comments) No effect with ampicillin  . Celecoxib Nausea Only    Other reaction(s): Other (See Comments) GI upset  . Ciprofloxacin Nausea Only  . Hydrocodone Nausea Only  . Propranolol Palpitations     REVIEW OF SYSTEMS (Negative unless checked)  Constitutional: [] Weight loss  [] Fever  [] Chills Cardiac: [] Chest pain   [] Chest pressure   [] Palpitations   [] Shortness of breath when laying flat   [] Shortness of breath with exertion. Vascular:  [x] Pain in legs with walking   [] Pain in legs at rest  [] History of DVT   [] Phlebitis   [] Swelling in legs   [] Varicose veins   [] Non-healing ulcers Pulmonary:   [] Uses home oxygen   [] Productive cough   [] Hemoptysis   [] Wheeze  [] COPD   [] Asthma Neurologic:  [] Dizziness   [] Seizures   [] History of stroke   [] History of TIA  [] Aphasia   [] Vissual changes   [] Weakness or  numbness in arm   [] Weakness or numbness in leg Musculoskeletal:   [] Joint swelling   [] Joint pain   [] Low back pain Hematologic:  [] Easy bruising  [] Easy bleeding   [] Hypercoagulable state   [] Anemic Gastrointestinal:  [] Diarrhea   [] Vomiting  [] Gastroesophageal reflux/heartburn   [] Difficulty swallowing. Genitourinary:  [] Chronic kidney disease   [] Difficult urination  [] Frequent urination   [] Blood in urine Skin:  [] Rashes   [] Ulcers  Psychological:  [] History of anxiety   []  History of major depression.  Physical Examination  Vitals:   09/29/16 1106  BP: 132/66  Pulse: (!) 56  Resp: 16  Weight: 194 lb (88 kg)   Body mass index is 28.65  kg/m. Gen: WD/WN, NAD Head: [place]/AT, No temporalis wasting.  Ear/Nose/Throat: Hearing grossly intact, nares w/o erythema or drainage Eyes: PER, EOMI, sclera nonicteric.  Neck: Supple, no large masses.   Pulmonary:  Good air movement, no audible wheezing bilaterally, no use of accessory muscles.  Cardiac: RRR, no JVD Vascular:  Vessel Right Left  Radial Palpable Palpable  Ulnar Palpable Palpable  Brachial Palpable Palpable  Carotid Palpable Palpable  Femoral Palpable Palpable  Popliteal Palpable Palpable  PT Not Palpable Not Palpable  DP Palpable Palpable  Gastrointestinal: Non-distended. No guarding/no peritoneal signs.  Musculoskeletal: M/S 5/5 throughout.  No deformity or atrophy.  Neurologic: CN 2-12 intact. Symmetrical.  Speech is fluent. Motor exam as listed above. Psychiatric: Judgment intact, Mood & affect appropriate for pt's clinical situation. Dermatologic: No rashes or ulcers noted.  No changes consistent with cellulitis. Lymph : No lichenification or skin changes of chronic lymphedema.  CBC Lab Results  Component Value Date   WBC 8.3 08/09/2016   HGB 14.0 08/09/2016   HCT 40.6 08/09/2016   MCV 92.7 08/09/2016   PLT 160 08/09/2016    BMET    Component Value Date/Time   NA 140 08/09/2016 0434   K 4.0 08/09/2016 0434     CL 103 08/09/2016 0434   CO2 31 08/09/2016 0434   GLUCOSE 149 (H) 08/09/2016 0434   BUN 20 08/09/2016 0434   CREATININE 0.99 08/09/2016 0434   CREATININE 1.59 (H) 06/23/2014 1115   CALCIUM 9.6 08/09/2016 0434   CALCIUM 9.7 06/23/2014 1115   GFRNONAA >60 08/09/2016 0434   GFRNONAA 42 (L) 06/23/2014 1115   GFRAA >60 08/09/2016 0434   GFRAA 48 (L) 06/23/2014 1115   CrCl cannot be calculated (Patient's most recent lab result is older than the maximum 21 days allowed.).  COAG No results found for: INR, PROTIME  Radiology No results found.  Assessment/Plan 1. AAA (abdominal aortic aneurysm) without rupture (HCC) No surgery or intervention at this time. The patient has an asymptomatic abdominal aortic aneurysm that is less than 4 cm in maximal diameter.  I have discussed the natural history of abdominal aortic aneurysm and the small risk of rupture for aneurysm less than 5 cm in size.  However, as these small aneurysms tend to enlarge over time, continued surveillance with ultrasound or CT scan is mandatory.  I have also discussed optimizing medical management with hypertension and lipid control and the importance of abstinence from tobacco.  The patient is also encouraged to exercise a minimum of 30 minutes 4 times a week.  Should the patient develop new onset abdominal or back pain or signs of peripheral embolization they are instructed to seek medical attention immediately and to alert the physician providing care that they have an aneurysm.  The patient voices their understanding. The patient will return in 12 months with an aortic duplex.  - VAS US AORTA/IVC/ILIACS; Future  2. PAD (peripheral artery disease) (HCC)  Recommend:  The patient has evidence of atherosclerosis of the lower extremities with claudication.  The patient does not voice lifestyle limiting changes at this point in time.  Noninvasive studies do not suggest clinically significant change.  No invasive studies,  angiography or surgery at this time The patient should continue walking and begin a more formal exercise program.  The patient should continue antiplatelet therapy and aggressive treatment of the lipid abnormalities  No changes in the patient's medications at this time  The patient should continue wearing graduated compression socks 10-15 mmHg strength to  control the mild edema.   - VAS [place] ABI WITH/WO TBI; Future  3. Deep vein thrombosis (DVT) of popliteal vein of right lower extremity, unspecified chronicity (HCC) Recommend:   No surgery or intervention at this point in time.  IVC filter is not indicated at present.  The patient is continuing on anticoagulation   Elevation was stressed, use of a recliner was discussed.  I have had a long discussion with the patient regarding DVT and post phlebitic changes such as swelling and why it  causes symptoms such as pain.  The patient will wear graduated compression stockings class 1 (20-30 mmHg), beginning after three full days of anticoagulation, on a daily basis a prescription was given. The patient will  beginning wearing the stockings first thing in the morning and removing them in the evening. The patient is instructed specifically not to sleep in the stockings.  In addition, behavioral modification including elevation during the day and avoidance of prolonged dependency will be initiated.    The patient will continue anticoagulation for now as there have not been any problems or complications at this point.    4. Atrial fibrillation, unspecified type ([place]) Continue antiarrhythmia medications as already ordered, these medications have been reviewed and there are no changes at this time.  Continue anticoagulation as ordered by Cardiology Service   5. Type 2 diabetes mellitus with complication, unspecified whether long term insulin use (HCC) Continue hypoglycemic medications as already ordered, these medications have been reviewed and  there are no changes at this time.  Hgb A1C to be monitored as already arranged by primary service     Hortencia Pilar, MD  09/29/2016 11:41 AM

## 2017-02-10 ENCOUNTER — Emergency Department: Payer: Medicare Other

## 2017-02-10 ENCOUNTER — Emergency Department
Admission: EM | Admit: 2017-02-10 | Discharge: 2017-02-10 | Disposition: A | Discharge status: Home / Self Care | Payer: Medicare Other | Attending: Emergency Medicine | Admitting: Emergency Medicine

## 2017-02-10 ENCOUNTER — Other Ambulatory Visit: Payer: Self-pay

## 2017-02-10 DIAGNOSIS — R1084 Generalized abdominal pain: Secondary | ICD-10-CM

## 2017-02-10 DIAGNOSIS — E785 Hyperlipidemia, unspecified: Secondary | ICD-10-CM | POA: Diagnosis not present

## 2017-02-10 DIAGNOSIS — Z87891 Personal history of nicotine dependence: Secondary | ICD-10-CM | POA: Diagnosis not present

## 2017-02-10 DIAGNOSIS — Z794 Long term (current) use of insulin: Secondary | ICD-10-CM | POA: Insufficient documentation

## 2017-02-10 DIAGNOSIS — I252 Old myocardial infarction: Secondary | ICD-10-CM | POA: Diagnosis not present

## 2017-02-10 DIAGNOSIS — I10 Essential (primary) hypertension: Secondary | ICD-10-CM | POA: Diagnosis not present

## 2017-02-10 DIAGNOSIS — Z86718 Personal history of other venous thrombosis and embolism: Secondary | ICD-10-CM | POA: Insufficient documentation

## 2017-02-10 DIAGNOSIS — Z79899 Other long term (current) drug therapy: Secondary | ICD-10-CM | POA: Diagnosis not present

## 2017-02-10 DIAGNOSIS — E039 Hypothyroidism, unspecified: Secondary | ICD-10-CM | POA: Insufficient documentation

## 2017-02-10 DIAGNOSIS — I2510 Atherosclerotic heart disease of native coronary artery without angina pectoris: Secondary | ICD-10-CM | POA: Diagnosis not present

## 2017-02-10 DIAGNOSIS — I4891 Unspecified atrial fibrillation: Secondary | ICD-10-CM | POA: Insufficient documentation

## 2017-02-10 DIAGNOSIS — E119 Type 2 diabetes mellitus without complications: Secondary | ICD-10-CM | POA: Diagnosis not present

## 2017-02-10 DIAGNOSIS — Z7901 Long term (current) use of anticoagulants: Secondary | ICD-10-CM | POA: Diagnosis not present

## 2017-02-10 LAB — COMPREHENSIVE METABOLIC PANEL
ALT: 7 U/L — ABNORMAL LOW (ref 17–63)
ANION GAP: 10 (ref 5–15)
AST: 25 U/L (ref 15–41)
Albumin: 3.3 g/dL — ABNORMAL LOW (ref 3.5–5.0)
Alkaline Phosphatase: 45 U/L (ref 38–126)
BILIRUBIN TOTAL: 1 mg/dL (ref 0.3–1.2)
BUN: 20 mg/dL (ref 6–20)
CO2: 27 mmol/L (ref 22–32)
Calcium: 9.6 mg/dL (ref 8.9–10.3)
Chloride: 100 mmol/L — ABNORMAL LOW (ref 101–111)
Creatinine, Ser: 1.21 mg/dL (ref 0.61–1.24)
GFR, EST NON AFRICAN AMERICAN: 56 mL/min — AB (ref >60)
Glucose, Bld: 227 mg/dL — ABNORMAL HIGH (ref 65–99)
POTASSIUM: 3.5 mmol/L (ref 3.5–5.1)
Sodium: 137 mmol/L (ref 135–145)
TOTAL PROTEIN: 6.4 g/dL — AB (ref 6.5–8.1)

## 2017-02-10 LAB — CBC
HEMATOCRIT: 39.6 % — AB (ref 40.0–52.0)
Hemoglobin: 13.4 g/dL (ref 13.0–18.0)
MCH: 31.3 pg (ref 26.0–34.0)
MCHC: 33.8 g/dL (ref 32.0–36.0)
MCV: 92.5 fL (ref 80.0–100.0)
PLATELETS: 169 10*3/uL (ref 150–440)
RBC: 4.28 MIL/uL — ABNORMAL LOW (ref 4.40–5.90)
RDW: 16.6 % — AB (ref 11.5–14.5)
WBC: 10.1 10*3/uL (ref 3.8–10.6)

## 2017-02-10 LAB — TROPONIN I: TROPONIN I: 0.03 ng/mL — AB (ref <0.03)

## 2017-02-10 LAB — PROTIME-INR
INR: 1.44
PROTHROMBIN TIME: 17.4 s — AB (ref 11.4–15.2)

## 2017-02-10 MED ORDER — GI COCKTAIL ~~LOC~~
ORAL | Status: AC
  Filled 2017-02-10: qty 30

## 2017-02-10 MED ORDER — BARIUM SULFATE 2.1 % PO SUSP
450.0000 mL | ORAL | Status: AC
  Administered 2017-02-10: 450 mL via ORAL

## 2017-02-10 MED ORDER — GI COCKTAIL ~~LOC~~
30.0000 mL | Freq: Once | ORAL | Status: AC
  Administered 2017-02-10: 30 mL via ORAL

## 2017-02-10 NOTE — ED Provider Notes (Signed)
[facility] Emergency Department Provider Note   ____________________________________________    I have reviewed the triage vital signs and the nursing notes.   HISTORY  Chief Complaint Abdominal Pain     HPI Sean Armstrong is a 76 y.o. male who p/w abd pain. Patient complains of 'indigestion' type discomfort but also constipation. Reports had very hard stool today. No fevers/chills. No nausea/vomiting. Took pepto yesterday and then miralax today. Reports he had some chest pressure several days ago but that has resolved and he thinks it is related to his indigestion. No hx of bowel obstruction. No palpitations or sob.   Past Medical History:  Diagnosis Date  . Anemia   . Colon polyp   . Coronary artery disease   . Diabetes mellitus without complication ([place])   . FHx: SVT (supraventricular tachycardia) 1998  . GERD (gastroesophageal reflux disease)   . History of DVT (deep vein thrombosis) 2009  . History of pulmonary embolism 08/2008  . Hyperlipidemia   . Hypertension   . Hypothyroidism   . Lumbar disc disease   . MGUS (monoclonal gammopathy of unknown significance)   . Myocardial infarction ([place])   . Nephrolithiasis   . Osteoarthritis   . Peptic ulcer disease   . Renal insufficiency   . Rheumatoid arthritis [facility])     Patient Active Problem List   Diagnosis Date Noted  . AAA (abdominal aortic aneurysm) without rupture ([place]) 09/29/2016  . PAD (peripheral artery disease) ([place]) 09/29/2016  . Rib fracture 08/08/2016  . Constipation 08/08/2016  . Deep vein thrombosis (DVT) of popliteal vein of right lower extremity ([place]) 06/18/2016  . Left leg weakness 12/05/2015  . UTI (lower urinary tract infection) 12/05/2015  . Diabetes ([place]) 12/05/2015  . Anxiety 12/05/2015  . GERD (gastroesophageal reflux disease) 12/05/2015  . HLD (hyperlipidemia) 12/05/2015  . BPH (benign prostatic hyperplasia) 12/05/2015  . A-fib ([place]) 12/05/2015  . Sepsis  ([place]) 06/18/2015    Past Surgical History:  Procedure Laterality Date  . CARDIAC CATHETERIZATION    . CHOLECYSTECTOMY    . COLONOSCOPY  03/2007, 2006  . CORONARY ARTERY BYPASS GRAFT    . ESOPHAGOGASTRODUODENOSCOPY (EGD) WITH PROPOFOL N/A 07/02/2015   Procedure: ESOPHAGOGASTRODUODENOSCOPY (EGD) WITH PROPOFOL;  Surgeon: Manya Silvas, MD;  Location: [facility] ENDOSCOPY;  Service: Endoscopy;  Laterality: N/A;  . EYE SURGERY    . JOINT REPLACEMENT    . REPLACEMENT TOTAL KNEE BILATERAL    . TOTAL HIP ARTHROPLASTY Right     Prior to Admission medications   Medication Sig Start Date End Date Taking? Authorizing Provider  apixaban (ELIQUIS) 5 MG TABS tablet Take 1 tablet (5 mg total) by mouth 2 (two) times daily. 12/07/15  Yes Sudini, Alveta Heimlich, MD  atenolol (TENORMIN) 25 MG tablet Take 0.5 tablets (12.5 mg total) by mouth daily. 12/07/15  Yes Hillary Bow, MD  atorvastatin (LIPITOR) 80 MG tablet Take 1 tablet (80 mg total) by mouth daily at 6 PM. 12/07/15  Yes Sudini, Srikar, MD  carbidopa-levodopa (PARCOPA) 25-100 MG disintegrating tablet Take 1 tablet by mouth 3 (three) times daily.    Yes [provider]  folic acid (FOLVITE) 1 MG tablet Take 1 mg by mouth daily.   Yes [provider]  gabapentin (NEURONTIN) 400 MG capsule Take 400 mg by mouth daily.    Yes [provider]  glimepiride (AMARYL) 2 MG tablet Take 2 mg by mouth 2 (two) times daily.    Yes [provider]  Insulin Detemir (LEVEMIR FLEXTOUCH) 100 UNIT/ML Pen Inject 30 Units into the skin at bedtime. Patient taking differently: Inject 25 Units into the skin at bedtime.  08/10/16  Yes Fritzi Mandes, MD  methotrexate (RHEUMATREX) 2.5 MG tablet Take 12.5 mg by mouth once a week. Pt takes on Wednesday.  Caution:Chemotherapy. Protect from light.   Yes [provider]  Multiple Vitamins-Minerals (CENTRUM SILVER PO) Take 1 tablet by mouth daily.    Yes [provider]  omeprazole (PRILOSEC) 40  MG capsule Take 40 mg by mouth daily.   Yes [provider]  potassium chloride SA (K-DUR,KLOR-CON) 20 MEQ tablet Take 40 mEq by mouth daily.    Yes [provider]  predniSONE (DELTASONE) 5 MG tablet Take 5 mg by mouth daily with breakfast.   Yes [provider]  senna-docusate (SENOKOT-S) 8.6-50 MG tablet Take 2 tablets by mouth 2 (two) times daily. 08/07/16  Yes Carrie Mew, MD  sucralfate (CARAFATE) 1 g tablet Take 1 g by mouth 2 (two) times daily.   Yes [provider]  tamsulosin (FLOMAX) 0.4 MG CAPS capsule Take 0.4 mg by mouth daily after breakfast.    Yes [provider]  triamterene-hydrochlorothiazide (MAXZIDE) 75-50 MG tablet Take 1 tablet by mouth daily.   Yes [provider]  fluticasone (FLONASE) 50 MCG/ACT nasal spray Place 2 sprays into both nostrils daily.    [provider]  loperamide (IMODIUM) 2 MG capsule Take 1 capsule (2 mg total) by mouth every 4 (four) hours as needed for diarrhea or loose stools. 06/20/15   Hillary Bow, MD  LORazepam (ATIVAN) 0.5 MG tablet Take 0.5 mg by mouth 2 (two) times daily as needed for anxiety.     [provider]  oxyCODONE-acetaminophen (PERCOCET) 5-325 MG tablet Take 1-2 tablets by mouth every 6 (six) hours as needed for moderate pain or severe pain. Patient not taking: Reported on 02/10/2017 08/08/16   Earleen Newport, MD  polyethylene glycol powder [facility]) powder 2 cap fulls in a full glass of water, three times a day, for 5 days. Patient not taking: Reported on 02/10/2017 08/07/16   Carrie Mew, MD  traMADol (ULTRAM) 50 MG tablet Take 50 mg by mouth every 6 (six) hours as needed.  01/22/15   [provider]     Allergies Gentamycin [gentamicin]; Iodinated diagnostic agents; Meloxicam; Metrizamide; Penicillin v potassium; Celecoxib; Ciprofloxacin; Hydrocodone; and Propranolol  Family History  Problem Relation Age of Onset  . Heart  attack Father   . Diabetes Father   . Heart attack Brother     Social History Social History   Tobacco Use  . Smoking status: Former Smoker    Types: Cigarettes  . Smokeless tobacco: Current User    Types: Chew  . Tobacco comment: quit smoking cigerates 1995  Substance Use Topics  . Alcohol use: No    Alcohol/week: 0.0 oz  . Drug use: No    Review of Systems  Constitutional: No fever/chills Eyes: No visual changes.  ENT: No sore throat. Cardiovascular: Denies chest pain. Respiratory: Denies shortness of breath. Gastrointestinal: as above Genitourinary: Negative for dysuria. Musculoskeletal: Negative for back pain. Skin: Negative for rash. Neurological: Negative for headaches or weakness   ____________________________________________   PHYSICAL EXAM:  VITAL SIGNS: ED Triage Vitals  Enc Vitals Group     BP 02/10/17 0944 117/78     Pulse Rate 02/10/17 0944 68     Resp 02/10/17 0944 18     Temp 02/10/17  0947 98.9 F (37.2 C)     Temp Source 02/10/17 0944 Oral     SpO2 02/10/17 0944 99 %     Weight 02/10/17 0944 88.5 kg (195 lb)     Height 02/10/17 0944 1.727 m (5\' 8" )     Head Circumference --      Peak Flow --      Pain Score 02/10/17 0947 5     Pain Loc --      Pain Edu? --      Excl. in [place]? --     Constitutional: Alert and oriented. No acute distress. Pleasant and interactive Eyes: Conjunctivae are normal.    Mouth/Throat: Mucous membranes are moist.   Neck:  Painless ROM Cardiovascular: Normal rate, regular rhythm. Grossly normal heart sounds.  Good peripheral circulation. Respiratory: Normal respiratory effort.  No retractions. Lungs CTAB. Gastrointestinal: Mild ttp in the rlq. No distention.  No CVA tenderness. Genitourinary: deferred Musculoskeletal: .  Warm and well perfused Neurologic:  Normal speech and language. No gross focal neurologic deficits are appreciated.  Skin:  Skin is warm, dry and intact. No rash noted. Psychiatric: Mood and  affect are normal. Speech and behavior are normal.  ____________________________________________   LABS (all labs ordered are listed, but only abnormal results are displayed)  Labs Reviewed  CBC - Abnormal; Notable for the following components:      Result Value   RBC 4.28 (*)    HCT 39.6 (*)    RDW 16.6 (*)    All other components within normal limits  COMPREHENSIVE METABOLIC PANEL - Abnormal; Notable for the following components:   Chloride 100 (*)    Glucose, Bld 227 (*)    Total Protein 6.4 (*)    Albumin 3.3 (*)    ALT 7 (*)    GFR calc non Af Amer 56 (*)    All other components within normal limits  TROPONIN I - Abnormal; Notable for the following components:   Troponin I 0.03 (*)    All other components within normal limits  PROTIME-INR - Abnormal; Notable for the following components:   Prothrombin Time 17.4 (*)    All other components within normal limits   ____________________________________________  EKG  ED ECG REPORT I, Lavonia Drafts, the attending physician, personally viewed and interpreted this ECG.  Date: 02/10/2017  Rhythm: afib, chronic QRS Axis: normal Intervals: normal ST/T Wave abnormalities: normal Narrative Interpretation: no evidence of acute ischemia  ____________________________________________  RADIOLOGY  cxr nad CT scan stable, discussed possible hepatic cirrhosis with patient and son ____________________________________________   PROCEDURES  Procedure(s) performed: No  Procedures   Critical Care performed: No ____________________________________________   INITIAL IMPRESSION / ASSESSMENT AND PLAN / ED COURSE  Pertinent labs & imaging results that were available during my care of the patient were reviewed by me and considered in my medical decision making (see chart for details).  Patient presents with abdominal pain, some tenderness to palpation in the right lower quadrant as well as complaints of constipation and  epigastric discomfort which she describes as indigestion.  No fevers or chills.  No nausea or vomiting.  Hard stools as described.  CT results are reassuring, discussed results in depth with the patient and son.  After GI cocktail patient reports feeling significantly better and is anxious to leave.  I feel this is reasonable.  Recommended outpatient follow-up.  Return precautions discussed.    ____________________________________________   FINAL CLINICAL IMPRESSION(S) / ED DIAGNOSES  Final diagnoses:  Generalized abdominal pain        Note:  This document was prepared using Dragon voice recognition software and may include unintentional dictation errors.    Lavonia Drafts, MD 02/10/17 1336

## 2017-02-10 NOTE — ED Triage Notes (Signed)
Pt c/o constipation with abd pain nausea for the past 2-3 days. States he has a small BM this morning that he had to strain to pass and it was hard stool.

## 2017-02-10 NOTE — ED Notes (Signed)
Patient assisted to use toilet. Able to ambulate with steady gait with walker.

## 2017-03-19 ENCOUNTER — Other Ambulatory Visit: Payer: Self-pay

## 2017-03-19 ENCOUNTER — Emergency Department: Payer: Medicare Other

## 2017-03-19 ENCOUNTER — Emergency Department
Admission: EM | Admit: 2017-03-19 | Discharge: 2017-03-19 | Disposition: A | Discharge status: Home / Self Care | Payer: Medicare Other | Attending: Emergency Medicine | Admitting: Emergency Medicine

## 2017-03-19 DIAGNOSIS — W010XXA Fall on same level from slipping, tripping and stumbling without subsequent striking against object, initial encounter: Secondary | ICD-10-CM | POA: Insufficient documentation

## 2017-03-19 DIAGNOSIS — I10 Essential (primary) hypertension: Secondary | ICD-10-CM | POA: Insufficient documentation

## 2017-03-19 DIAGNOSIS — S0012XA Contusion of left eyelid and periocular area, initial encounter: Secondary | ICD-10-CM | POA: Diagnosis not present

## 2017-03-19 DIAGNOSIS — Z86718 Personal history of other venous thrombosis and embolism: Secondary | ICD-10-CM | POA: Insufficient documentation

## 2017-03-19 DIAGNOSIS — Z7901 Long term (current) use of anticoagulants: Secondary | ICD-10-CM | POA: Diagnosis not present

## 2017-03-19 DIAGNOSIS — W19XXXA Unspecified fall, initial encounter: Secondary | ICD-10-CM

## 2017-03-19 DIAGNOSIS — Y939 Activity, unspecified: Secondary | ICD-10-CM | POA: Diagnosis not present

## 2017-03-19 DIAGNOSIS — I2510 Atherosclerotic heart disease of native coronary artery without angina pectoris: Secondary | ICD-10-CM | POA: Diagnosis not present

## 2017-03-19 DIAGNOSIS — M069 Rheumatoid arthritis, unspecified: Secondary | ICD-10-CM | POA: Diagnosis not present

## 2017-03-19 DIAGNOSIS — Z96641 Presence of right artificial hip joint: Secondary | ICD-10-CM | POA: Diagnosis not present

## 2017-03-19 DIAGNOSIS — Z96653 Presence of artificial knee joint, bilateral: Secondary | ICD-10-CM | POA: Diagnosis not present

## 2017-03-19 DIAGNOSIS — S61411A Laceration without foreign body of right hand, initial encounter: Secondary | ICD-10-CM | POA: Insufficient documentation

## 2017-03-19 DIAGNOSIS — I714 Abdominal aortic aneurysm, without rupture: Secondary | ICD-10-CM | POA: Diagnosis not present

## 2017-03-19 DIAGNOSIS — Z23 Encounter for immunization: Secondary | ICD-10-CM | POA: Insufficient documentation

## 2017-03-19 DIAGNOSIS — I252 Old myocardial infarction: Secondary | ICD-10-CM | POA: Insufficient documentation

## 2017-03-19 DIAGNOSIS — E119 Type 2 diabetes mellitus without complications: Secondary | ICD-10-CM | POA: Diagnosis not present

## 2017-03-19 DIAGNOSIS — Y999 Unspecified external cause status: Secondary | ICD-10-CM | POA: Insufficient documentation

## 2017-03-19 DIAGNOSIS — Z8673 Personal history of transient ischemic attack (TIA), and cerebral infarction without residual deficits: Secondary | ICD-10-CM | POA: Diagnosis not present

## 2017-03-19 DIAGNOSIS — E039 Hypothyroidism, unspecified: Secondary | ICD-10-CM | POA: Insufficient documentation

## 2017-03-19 DIAGNOSIS — Y929 Unspecified place or not applicable: Secondary | ICD-10-CM | POA: Insufficient documentation

## 2017-03-19 DIAGNOSIS — S0993XA Unspecified injury of face, initial encounter: Secondary | ICD-10-CM | POA: Diagnosis present

## 2017-03-19 MED ORDER — TETANUS-DIPHTH-ACELL PERTUSSIS 5-2.5-18.5 LF-MCG/0.5 IM SUSP
INTRAMUSCULAR | Status: AC
  Administered 2017-03-19: 0.5 mL via INTRAMUSCULAR
  Filled 2017-03-19: qty 0.5

## 2017-03-19 MED ORDER — TETANUS-DIPHTHERIA TOXOIDS TD 5-2 LFU IM INJ
0.5000 mL | INJECTION | Freq: Once | INTRAMUSCULAR | Status: DC
  Filled 2017-03-19: qty 0.5

## 2017-03-19 MED ORDER — BACITRACIN ZINC 500 UNIT/GM EX OINT: TOPICAL_OINTMENT | CUTANEOUS | 0 refills | Status: AC

## 2017-03-19 NOTE — ED Triage Notes (Signed)
Pt states around 4pm he fell on the dirt. Bruising noted to L eye and L side of face. Denies blurred vision to L eye. Pt has skin tear to back of R hand, wrapped in gauze. No active bleeding. Pt is alert, oriented, answering questions appropriately. Eating in triage. On a blood thinner, unsure which one.

## 2017-03-19 NOTE — Discharge Instructions (Signed)
As we discussed please leave the bandage in place of the right hand for the next 2 days.  After which please remove, cover with a small area of bacitracin and cover with a new bandage daily for the next 10 days.  Your workup today has shown no fractures or acute abnormality.  Please take Tylenol as needed for discomfort as written on the box.  Return to the emergency department for any increased pain, weakness or numbness of any arm or leg, or any other symptom personally concerning to yourself.  Otherwise please follow-up with your doctor in the next several days for recheck/reevaluation.

## 2017-03-19 NOTE — ED Provider Notes (Signed)
[facility] Emergency Department Provider Note  Time seen: 8:52 PM  I have reviewed the triage vital signs and the nursing notes.   HISTORY  Chief Complaint Fall and Facial Injury    HPI Sean Armstrong is a 77 y.o. male with a past medical history of diabetes, gastric reflux, hypertension, hyperlipidemia, CVA on Eliquis, presents to the emergency department after a fall.  According to the patient he got tripped up on his feet falling forwards hitting the left side of his face on the ground.  Denies LOC.  States mild neck discomfort, and bruising to his left face and a right hand skin tears his only complaints.  Patient states the pain in the neck is mild really only hurting when he moves his neck.  Denies any weakness or numbness of any arm or leg.  Denies any LOC or confusion.  Given the patient is on a blood thinner he came to the emergency department for evaluation to make sure he did not have a head bleed.   Past Medical History:  Diagnosis Date  . Anemia   . Colon polyp   . Coronary artery disease   . Diabetes mellitus without complication ([place])   . FHx: SVT (supraventricular tachycardia) 1998  . GERD (gastroesophageal reflux disease)   . History of DVT (deep vein thrombosis) 2009  . History of pulmonary embolism 08/2008  . Hyperlipidemia   . Hypertension   . Hypothyroidism   . Lumbar disc disease   . MGUS (monoclonal gammopathy of unknown significance)   . Myocardial infarction ([place])   . Nephrolithiasis   . Osteoarthritis   . Peptic ulcer disease   . Renal insufficiency   . Rheumatoid arthritis [facility])     Patient Active Problem List   Diagnosis Date Noted  . AAA (abdominal aortic aneurysm) without rupture ([place]) 09/29/2016  . PAD (peripheral artery disease) ([place]) 09/29/2016  . Rib fracture 08/08/2016  . Constipation 08/08/2016  . Deep vein thrombosis (DVT) of popliteal vein of right lower extremity ([place]) 06/18/2016  . Left leg weakness  12/05/2015  . UTI (lower urinary tract infection) 12/05/2015  . Diabetes ([place]) 12/05/2015  . Anxiety 12/05/2015  . GERD (gastroesophageal reflux disease) 12/05/2015  . HLD (hyperlipidemia) 12/05/2015  . BPH (benign prostatic hyperplasia) 12/05/2015  . A-fib ([place]) 12/05/2015  . Sepsis ([place]) 06/18/2015    Past Surgical History:  Procedure Laterality Date  . CARDIAC CATHETERIZATION    . CHOLECYSTECTOMY    . COLONOSCOPY  03/2007, 2006  . CORONARY ARTERY BYPASS GRAFT    . ESOPHAGOGASTRODUODENOSCOPY (EGD) WITH PROPOFOL N/A 07/02/2015   Procedure: ESOPHAGOGASTRODUODENOSCOPY (EGD) WITH PROPOFOL;  Surgeon: Manya Silvas, MD;  Location: [facility] ENDOSCOPY;  Service: Endoscopy;  Laterality: N/A;  . EYE SURGERY    . JOINT REPLACEMENT    . REPLACEMENT TOTAL KNEE BILATERAL    . TOTAL HIP ARTHROPLASTY Right     Prior to Admission medications   Medication Sig Start Date End Date Taking? Authorizing Provider  apixaban (ELIQUIS) 5 MG TABS tablet Take 1 tablet (5 mg total) by mouth 2 (two) times daily. 12/07/15   Hillary Bow, MD  atenolol (TENORMIN) 25 MG tablet Take 0.5 tablets (12.5 mg total) by mouth daily. 12/07/15   Hillary Bow, MD  atorvastatin (LIPITOR) 80 MG tablet Take 1 tablet (80 mg total) by mouth daily at 6 PM. 12/07/15   Hillary Bow, MD  carbidopa-levodopa (PARCOPA) 25-100 MG disintegrating tablet Take 1 tablet by mouth 3 (three) times  daily.     [provider]  fluticasone (FLONASE) 50 MCG/ACT nasal spray Place 2 sprays into both nostrils daily.    [provider]  folic acid (FOLVITE) 1 MG tablet Take 1 mg by mouth daily.    [provider]  gabapentin (NEURONTIN) 400 MG capsule Take 400 mg by mouth daily.     [provider]  glimepiride (AMARYL) 2 MG tablet Take 2 mg by mouth 2 (two) times daily.     [provider]  Insulin Detemir (LEVEMIR FLEXTOUCH) 100 UNIT/ML Pen Inject 30 Units into the skin at bedtime. Patient taking  differently: Inject 25 Units into the skin at bedtime.  08/10/16   Fritzi Mandes, MD  loperamide (IMODIUM) 2 MG capsule Take 1 capsule (2 mg total) by mouth every 4 (four) hours as needed for diarrhea or loose stools. 06/20/15   Hillary Bow, MD  LORazepam (ATIVAN) 0.5 MG tablet Take 0.5 mg by mouth 2 (two) times daily as needed for anxiety.     [provider]  methotrexate (RHEUMATREX) 2.5 MG tablet Take 12.5 mg by mouth once a week. Pt takes on Wednesday.  Caution:Chemotherapy. Protect from light.    [provider]  Multiple Vitamins-Minerals (CENTRUM SILVER PO) Take 1 tablet by mouth daily.     [provider]  omeprazole (PRILOSEC) 40 MG capsule Take 40 mg by mouth daily.    [provider]  oxyCODONE-acetaminophen (PERCOCET) 5-325 MG tablet Take 1-2 tablets by mouth every 6 (six) hours as needed for moderate pain or severe pain. Patient not taking: Reported on 02/10/2017 08/08/16   Earleen Newport, MD  polyethylene glycol powder [facility]) powder 2 cap fulls in a full glass of water, three times a day, for 5 days. Patient not taking: Reported on 02/10/2017 08/07/16   Carrie Mew, MD  potassium chloride SA (K-DUR,KLOR-CON) 20 MEQ tablet Take 40 mEq by mouth daily.     [provider]  predniSONE (DELTASONE) 5 MG tablet Take 5 mg by mouth daily with breakfast.    [provider]  senna-docusate (SENOKOT-S) 8.6-50 MG tablet Take 2 tablets by mouth 2 (two) times daily. 08/07/16   Carrie Mew, MD  sucralfate (CARAFATE) 1 g tablet Take 1 g by mouth 2 (two) times daily.    [provider]  tamsulosin (FLOMAX) 0.4 MG CAPS capsule Take 0.4 mg by mouth daily after breakfast.     [provider]  traMADol (ULTRAM) 50 MG tablet Take 50 mg by mouth every 6 (six) hours as needed.  01/22/15   [provider]  triamterene-hydrochlorothiazide (MAXZIDE) 75-50 MG tablet Take 1 tablet by mouth daily.    [provider]    Allergies  Allergen Reactions  . Gentamycin [Gentamicin] Other (See Comments)    Reaction:  Caused kidney issues for pt   . Iodinated Diagnostic Agents     Other reaction(s): Other (See Comments) "Burning sensation throughout whole body"  . Meloxicam     Other reaction(s): Other (See Comments) GI UPSET  . Metrizamide     Other reaction(s): Other (See Comments) "Burning sensation throughout whole body"  . Penicillin V Potassium   . Celecoxib Nausea Only    Other reaction(s): Other (See Comments) GI upset  . Ciprofloxacin Nausea Only  . Hydrocodone Nausea Only  . Propranolol Palpitations    Family History  Problem Relation Age of Onset  . Heart attack Father   . Diabetes Father   . Heart attack  Brother     Social History Social History   Tobacco Use  . Smoking status: Former Smoker    Types: Cigarettes  . Smokeless tobacco: Current User    Types: Chew  . Tobacco comment: quit smoking cigerates 1995  Substance Use Topics  . Alcohol use: No    Alcohol/week: 0.0 oz  . Drug use: No    Review of Systems Constitutional: Negative for loss of consciousness Eyes: Negative for visual changes.  Bruising around the left eye ENT: No pain around the nose. Cardiovascular: Negative for chest pain. Respiratory: Negative for shortness of breath. Gastrointestinal: Negative for abdominal pain Genitourinary: No urinary complaints. Musculoskeletal: Mild neck pain worse with movement. Skin: Skin tear to right hand, bruising to left face. Neurological: Negative for headaches, focal weakness or numbness. All other ROS negative  ____________________________________________   PHYSICAL EXAM:  VITAL SIGNS: ED Triage Vitals  Enc Vitals Group     BP 03/19/17 1747 (!) 161/80     Pulse Rate 03/19/17 1747 63     Resp 03/19/17 1747 18     Temp 03/19/17 1747 98.6 F (37 C)     Temp Source 03/19/17 1747 Oral     SpO2 03/19/17 1747 100 %     Weight 03/19/17 1748  184 lb (83.5 kg)     Height 03/19/17 1748 5\' 8"  (1.727 m)     Head Circumference --      Peak Flow --      Pain Score 03/19/17 1747 8     Pain Loc --      Pain Edu? --      Excl. in [place]? --     Constitutional: Alert and oriented. Well appearing and in no distress. Eyes: Normal exam ENT   Head: Mild amount of ecchymosis lateral to the left eye.  Slight tenderness to palpation over this area.   Nose: No congestion/rhinnorhea.  No blood within nostrils.   Mouth/Throat: Mucous membranes are moist. Cardiovascular: Normal rate, regular rhythm.  Respiratory: Normal respiratory effort without tachypnea nor retractions. Breath sounds are clear Gastrointestinal: Soft and nontender. No distention.   Musculoskeletal: Good range of motion in extremities.  Patient states he is ambulated without difficulty since the fall, uses a walker at baseline.  Patient has apartment 5 cm skin tear to the dorsal aspect of the right hand with great skin coverage. Neurologic:  Normal speech and language. No gross focal neurologic deficits Skin:  Skin is warm.  Skin tear to dorsal aspect of right hand approximately 5-6 cm in length, good skin coverage. Psychiatric: Mood and affect are normal.   ____________________________________________   RADIOLOGY  CT imaging negative for acute fracture or intracranial hemorrhage.  ____________________________________________   INITIAL IMPRESSION / ASSESSMENT AND PLAN / ED COURSE  Pertinent labs & imaging results that were available during my care of the patient were reviewed by me and considered in my medical decision making (see chart for details).  Department for fall.  Differential would include ICH, concussion, facial fracture, Neck fracture.  We will check CT imaging.  Patient does have a small to moderate skin tear to the dorsal aspect of the right hand but the skin is completely covering the skin tear area.  We will cover with Xeroform and dressing.   Discussed wound care with the patient will prescribe bacitracin to use daily and cover with a bandage for the next 10 days.  Patient does not know when his last tetanus shot was we will updated  in the emergency department.  Patient CT imaging is negative for acute fracture.  Very slight cervical paraspinal tenderness to palpation likely muscular cervical strain from the fall, no fracture on CT, no weakness or numbness.  ____________________________________________   FINAL CLINICAL IMPRESSION(S) / ED DIAGNOSES  Fall Skin tear    Harvest Dark, MD 03/19/17 2057

## 2017-03-19 NOTE — ED Notes (Signed)
Esign not working, pt verblized discharge instructions and has no questions at this time

## 2017-08-10 ENCOUNTER — Encounter: Payer: Self-pay | Admitting: Emergency Medicine

## 2017-08-10 ENCOUNTER — Emergency Department: Payer: Medicare Other

## 2017-08-10 ENCOUNTER — Emergency Department
Admission: EM | Admit: 2017-08-10 | Discharge: 2017-08-10 | Disposition: A | Discharge status: Home / Self Care | Payer: Medicare Other | Attending: Emergency Medicine | Admitting: Emergency Medicine

## 2017-08-10 DIAGNOSIS — R2232 Localized swelling, mass and lump, left upper limb: Secondary | ICD-10-CM | POA: Diagnosis present

## 2017-08-10 DIAGNOSIS — Z87891 Personal history of nicotine dependence: Secondary | ICD-10-CM | POA: Diagnosis not present

## 2017-08-10 DIAGNOSIS — I2510 Atherosclerotic heart disease of native coronary artery without angina pectoris: Secondary | ICD-10-CM | POA: Diagnosis not present

## 2017-08-10 DIAGNOSIS — Z79899 Other long term (current) drug therapy: Secondary | ICD-10-CM | POA: Diagnosis not present

## 2017-08-10 DIAGNOSIS — E119 Type 2 diabetes mellitus without complications: Secondary | ICD-10-CM | POA: Diagnosis not present

## 2017-08-10 DIAGNOSIS — Z794 Long term (current) use of insulin: Secondary | ICD-10-CM | POA: Insufficient documentation

## 2017-08-10 DIAGNOSIS — L03114 Cellulitis of left upper limb: Secondary | ICD-10-CM

## 2017-08-10 DIAGNOSIS — I10 Essential (primary) hypertension: Secondary | ICD-10-CM | POA: Diagnosis not present

## 2017-08-10 DIAGNOSIS — R609 Edema, unspecified: Secondary | ICD-10-CM | POA: Insufficient documentation

## 2017-08-10 DIAGNOSIS — Z7901 Long term (current) use of anticoagulants: Secondary | ICD-10-CM | POA: Insufficient documentation

## 2017-08-10 LAB — COMPREHENSIVE METABOLIC PANEL
ALK PHOS: 47 U/L (ref 38–126)
ALT: 22 U/L (ref 17–63)
ANION GAP: 7 (ref 5–15)
AST: 28 U/L (ref 15–41)
Albumin: 3 g/dL — ABNORMAL LOW (ref 3.5–5.0)
BILIRUBIN TOTAL: 1 mg/dL (ref 0.3–1.2)
BUN: 19 mg/dL (ref 6–20)
CO2: 26 mmol/L (ref 22–32)
Calcium: 10 mg/dL (ref 8.9–10.3)
Chloride: 103 mmol/L (ref 101–111)
Creatinine, Ser: 0.98 mg/dL (ref 0.61–1.24)
GFR calc non Af Amer: >60 mL/min (ref >60)
Glucose, Bld: 206 mg/dL — ABNORMAL HIGH (ref 65–99)
Potassium: 3.5 mmol/L (ref 3.5–5.1)
Sodium: 136 mmol/L (ref 135–145)
TOTAL PROTEIN: 7.2 g/dL (ref 6.5–8.1)

## 2017-08-10 LAB — CBC WITH DIFFERENTIAL/PLATELET
BASOS ABS: 0.1 10*3/uL (ref 0–0.1)
BASOS PCT: 1 %
Eosinophils Absolute: 0.1 10*3/uL (ref 0–0.7)
Eosinophils Relative: 1 %
HEMATOCRIT: 39.5 % — AB (ref 40.0–52.0)
HEMOGLOBIN: 13.1 g/dL (ref 13.0–18.0)
Lymphocytes Relative: 11 %
Lymphs Abs: 1.2 10*3/uL (ref 1.0–3.6)
MCH: 29 pg (ref 26.0–34.0)
MCHC: 33.2 g/dL (ref 32.0–36.0)
MCV: 87.2 fL (ref 80.0–100.0)
Monocytes Absolute: 0.7 10*3/uL (ref 0.2–1.0)
Monocytes Relative: 6 %
NEUTROS PCT: 81 %
Neutro Abs: 9.1 10*3/uL — ABNORMAL HIGH (ref 1.4–6.5)
Platelets: 191 10*3/uL (ref 150–440)
RBC: 4.53 MIL/uL (ref 4.40–5.90)
RDW: 16.4 % — AB (ref 11.5–14.5)
WBC: 11.2 10*3/uL — AB (ref 3.8–10.6)

## 2017-08-10 MED ORDER — DOXYCYCLINE HYCLATE 100 MG PO TABS: 100.0000 mg | ORAL_TABLET | Freq: Two times a day (BID) | ORAL | 0 refills | Status: DC

## 2017-08-10 NOTE — ED Provider Notes (Signed)
[facility] Emergency Department Provider Note   ____________________________________________    I have reviewed the triage vital signs and the nursing notes.   HISTORY  Chief Complaint Arm Swelling     HPI Sean Armstrong is a 77 y.o. male who presents with complaints of left arm swelling.  Reported patient woke up this morning and noticed that his left arm was significantly swollen from the elbow to the fingertips.  Son reports that he has had swelling in the left arm for nearly a week now but it was much worse today.  He has several areas of skin break on his hand, son states that he has a history of picking at the skin on his hand.  No fevers reported.  No shortness of breath.  Is on a fluid pill.  Has a history of diabetes  Past Medical History:  Diagnosis Date  . Anemia   . Colon polyp   . Coronary artery disease   . Diabetes mellitus without complication ([place])   . FHx: SVT (supraventricular tachycardia) 1998  . GERD (gastroesophageal reflux disease)   . History of DVT (deep vein thrombosis) 2009  . History of pulmonary embolism 08/2008  . Hyperlipidemia   . Hypertension   . Hypothyroidism   . Lumbar disc disease   . MGUS (monoclonal gammopathy of unknown significance)   . Myocardial infarction ([place])   . Nephrolithiasis   . Osteoarthritis   . Peptic ulcer disease   . Renal insufficiency   . Rheumatoid arthritis [facility])     Patient Active Problem List   Diagnosis Date Noted  . AAA (abdominal aortic aneurysm) without rupture ([place]) 09/29/2016  . PAD (peripheral artery disease) ([place]) 09/29/2016  . Rib fracture 08/08/2016  . Constipation 08/08/2016  . Deep vein thrombosis (DVT) of popliteal vein of right lower extremity ([place]) 06/18/2016  . Left leg weakness 12/05/2015  . UTI (lower urinary tract infection) 12/05/2015  . Diabetes ([place]) 12/05/2015  . Anxiety 12/05/2015  . GERD (gastroesophageal reflux disease) 12/05/2015  . HLD  (hyperlipidemia) 12/05/2015  . BPH (benign prostatic hyperplasia) 12/05/2015  . A-fib ([place]) 12/05/2015  . Sepsis ([place]) 06/18/2015    Past Surgical History:  Procedure Laterality Date  . CARDIAC CATHETERIZATION    . CHOLECYSTECTOMY    . COLONOSCOPY  03/2007, 2006  . CORONARY ARTERY BYPASS GRAFT    . ESOPHAGOGASTRODUODENOSCOPY (EGD) WITH PROPOFOL N/A 07/02/2015   Procedure: ESOPHAGOGASTRODUODENOSCOPY (EGD) WITH PROPOFOL;  Surgeon: Manya Silvas, MD;  Location: [facility] ENDOSCOPY;  Service: Endoscopy;  Laterality: N/A;  . EYE SURGERY    . JOINT REPLACEMENT    . REPLACEMENT TOTAL KNEE BILATERAL    . TOTAL HIP ARTHROPLASTY Right     Prior to Admission medications   Medication Sig Start Date End Date Taking? Authorizing Provider  apixaban (ELIQUIS) 5 MG TABS tablet Take 1 tablet (5 mg total) by mouth 2 (two) times daily. 12/07/15   Hillary Bow, MD  atenolol (TENORMIN) 25 MG tablet Take 0.5 tablets (12.5 mg total) by mouth daily. 12/07/15   Hillary Bow, MD  atorvastatin (LIPITOR) 80 MG tablet Take 1 tablet (80 mg total) by mouth daily at 6 PM. 12/07/15   Hillary Bow, MD  bacitracin ointment Apply to affected area daily x 10 days 03/19/17 03/19/18  Harvest Dark, MD  carbidopa-levodopa (PARCOPA) 25-100 MG disintegrating tablet Take 1 tablet by mouth 3 (three) times daily.     [provider]  doxycycline (VIBRA-TABS) 100 MG tablet  Take 1 tablet (100 mg total) by mouth 2 (two) times daily. 08/10/17   Lavonia Drafts, MD  fluticasone (FLONASE) 50 MCG/ACT nasal spray Place 2 sprays into both nostrils daily.    [provider]  folic acid (FOLVITE) 1 MG tablet Take 1 mg by mouth daily.    [provider]  gabapentin (NEURONTIN) 400 MG capsule Take 400 mg by mouth daily.     [provider]  glimepiride (AMARYL) 2 MG tablet Take 2 mg by mouth 2 (two) times daily.     [provider]  Insulin Detemir (LEVEMIR FLEXTOUCH) 100 UNIT/ML Pen Inject 30 Units  into the skin at bedtime. Patient taking differently: Inject 25 Units into the skin at bedtime.  08/10/16   Fritzi Mandes, MD  loperamide (IMODIUM) 2 MG capsule Take 1 capsule (2 mg total) by mouth every 4 (four) hours as needed for diarrhea or loose stools. 06/20/15   Hillary Bow, MD  LORazepam (ATIVAN) 0.5 MG tablet Take 0.5 mg by mouth 2 (two) times daily as needed for anxiety.     [provider]  methotrexate (RHEUMATREX) 2.5 MG tablet Take 12.5 mg by mouth once a week. Pt takes on Wednesday.  Caution:Chemotherapy. Protect from light.    [provider]  Multiple Vitamins-Minerals (CENTRUM SILVER PO) Take 1 tablet by mouth daily.     [provider]  omeprazole (PRILOSEC) 40 MG capsule Take 40 mg by mouth daily.    [provider]  oxyCODONE-acetaminophen (PERCOCET) 5-325 MG tablet Take 1-2 tablets by mouth every 6 (six) hours as needed for moderate pain or severe pain. Patient not taking: Reported on 02/10/2017 08/08/16   Earleen Newport, MD  polyethylene glycol powder [facility]) powder 2 cap fulls in a full glass of water, three times a day, for 5 days. Patient not taking: Reported on 02/10/2017 08/07/16   Carrie Mew, MD  potassium chloride SA (K-DUR,KLOR-CON) 20 MEQ tablet Take 40 mEq by mouth daily.     [provider]  predniSONE (DELTASONE) 5 MG tablet Take 5 mg by mouth daily with breakfast.    [provider]  senna-docusate (SENOKOT-S) 8.6-50 MG tablet Take 2 tablets by mouth 2 (two) times daily. 08/07/16   Carrie Mew, MD  sucralfate (CARAFATE) 1 g tablet Take 1 g by mouth 2 (two) times daily.    [provider]  tamsulosin (FLOMAX) 0.4 MG CAPS capsule Take 0.4 mg by mouth daily after breakfast.     [provider]  traMADol (ULTRAM) 50 MG tablet Take 50 mg by mouth every 6 (six) hours as needed.  01/22/15   [provider]  triamterene-hydrochlorothiazide (MAXZIDE) 75-50 MG tablet  Take 1 tablet by mouth daily.    [provider]     Allergies Gentamycin [gentamicin]; Iodinated diagnostic agents; Meloxicam; Metrizamide; Penicillin v potassium; Celecoxib; Ciprofloxacin; Hydrocodone; and Propranolol  Family History  Problem Relation Age of Onset  . Heart attack Father   . Diabetes Father   . Heart attack Brother     Social History Social History   Tobacco Use  . Smoking status: Former Smoker    Types: Cigarettes  . Smokeless tobacco: Current User    Types: Chew  . Tobacco comment: quit smoking cigerates 1995  Substance Use Topics  . Alcohol use: No    Alcohol/week: 0.0 oz  . Drug use: No    Review of Systems  Constitutional: No fever/chills Eyes: No visual changes.  ENT: No sore throat.  Cardiovascular: Denies chest pain. Respiratory: Denies shortness of breath. Gastrointestinal: No nausea, no vomiting.   Genitourinary: Negative for dysuria. Musculoskeletal: Left forearm swelling as above Skin: Mild redness to the forearm Neurological: Negative for headaches    ____________________________________________   PHYSICAL EXAM:  VITAL SIGNS: ED Triage Vitals [08/10/17 1204]  Enc Vitals Group     BP (!) 141/62     Pulse Rate 64     Resp 17     Temp 98.8 F (37.1 C)     Temp Source Oral     SpO2 100 %     Weight 89.8 kg (198 lb)     Height 1.753 m (5\' 9" )     Head Circumference      Peak Flow      Pain Score 8     Pain Loc      Pain Edu?      Excl. in [place]?     Constitutional: Alert and oriented. No acute distress.  Eyes: Conjunctivae are normal.    Mouth/Throat: Mucous membranes are moist.    Cardiovascular: Normal rate, regular rhythm. Grossly normal heart sounds.  Good peripheral circulation. Respiratory: Normal respiratory effort.  No retractions.  No rales Gastrointestinal: Soft and nontender. No distention.  No CVA tenderness.  Musculoskeletal: 1+ edema to the lower extremities.  Left arm, 1+ edema to the level of  the elbow.  Very mild erythema posteriorly, no joint pain, full range of motion.  Chronic skin changes, warm and well perfused, normal pulses Neurologic:  Normal speech and language. No gross focal neurologic deficits are appreciated.  Skin:  Skin is warm, dry  Psychiatric: Mood and affect are normal. Speech and behavior are normal.  ____________________________________________   LABS (all labs ordered are listed, but only abnormal results are displayed)  Labs Reviewed  COMPREHENSIVE METABOLIC PANEL - Abnormal; Notable for the following components:      Result Value   Glucose, Bld 206 (*)    Albumin 3.0 (*)    All other components within normal limits  CBC WITH DIFFERENTIAL/PLATELET - Abnormal; Notable for the following components:   WBC 11.2 (*)    HCT 39.5 (*)    RDW 16.4 (*)    Neutro Abs 9.1 (*)    All other components within normal limits  URINALYSIS, COMPLETE (UACMP) WITH MICROSCOPIC   ____________________________________________  EKG  None ____________________________________________  RADIOLOGY  Ultrasound negative for DVT ____________________________________________   PROCEDURES  Procedure(s) performed: No  Procedures   Critical Care performed: No ____________________________________________   INITIAL IMPRESSION / ASSESSMENT AND PLAN / ED COURSE  Pertinent labs & imaging results that were available during my care of the patient were reviewed by me and considered in my medical decision making (see chart for details).  Patient well-appearing in no acute distress.  Lab work overall reassuring, mild elevation white blood cell count is nonspecific.  Peripheral edema to the left arm, better now that this morning apparently.  Ultrasound negative for DVT.  Possibly a mild component of cellulitis given multiple areas of skin break and some mild erythema although no significant tenderness.  Will cover with an biotics and the patient follow-up with PCP for further  evaluation    ____________________________________________   FINAL CLINICAL IMPRESSION(S) / ED DIAGNOSES  Final diagnoses:  Peripheral edema  Cellulitis of left upper extremity        Note:  This document was prepared using Dragon voice recognition software and may include unintentional dictation errors.  Lavonia Drafts, MD 08/10/17 [phone]

## 2017-08-10 NOTE — ED Notes (Signed)
Pt has DVT/PE hx. [place] ordered.

## 2017-08-10 NOTE — ED Triage Notes (Signed)
Pt comes into the ED via POv with his family c/o left arm swelling that has been ongoing for a couple of days.  Patient states he woke up this morning and it was worse than normal.  Patient has intact pulses at this time, but positive for swelling.  Patient in NAD at this time with even and unlabored respirations.

## 2017-10-05 ENCOUNTER — Encounter (INDEPENDENT_AMBULATORY_CARE_PROVIDER_SITE_OTHER): Payer: Medicare Other

## 2017-10-05 ENCOUNTER — Ambulatory Visit (INDEPENDENT_AMBULATORY_CARE_PROVIDER_SITE_OTHER): Payer: Medicare Other | Admitting: Vascular Surgery

## 2017-11-12 ENCOUNTER — Ambulatory Visit: Payer: Self-pay | Admitting: Urology

## 2017-11-23 ENCOUNTER — Ambulatory Visit (INDEPENDENT_AMBULATORY_CARE_PROVIDER_SITE_OTHER): Payer: Medicare Other | Admitting: Urology

## 2017-11-23 ENCOUNTER — Encounter: Payer: Self-pay | Admitting: Urology

## 2017-11-23 VITALS — BP 131/71 | HR 64 | Ht 69.0 in

## 2017-11-23 DIAGNOSIS — N471 Phimosis: Secondary | ICD-10-CM | POA: Diagnosis not present

## 2017-11-23 NOTE — Progress Notes (Signed)
11/23/2017 10:30 AM   [name] November 17, 1940 [id number]  Referring provider: Albina Billet, MD [address]   [place], [place] [address]  CC: Phimosis  HPI: I had the pleasure of seeing Mr. Pietrzyk in urology clinic today to discuss his phimosis.  He is a co-morbid 77 year old male with history of Parkinson's disease, CAD, stroke, diabetes, DVT, and atrial fibrillation on apixaban.  He has had issues with phimosis with urinary dribbling and difficulty voiding.  He denies any urinary tract infections or gross hematuria.  There are no aggravating or alleviating factors.  Severity is moderate.  Duration is greater than 1 year.   PMH: Past Medical History:  Diagnosis Date  . Anemia   . Colon polyp   . Coronary artery disease   . Diabetes mellitus without complication ([place])   . FHx: SVT (supraventricular tachycardia) 1998  . GERD (gastroesophageal reflux disease)   . History of DVT (deep vein thrombosis) 2009  . History of pulmonary embolism 08/2008  . Hyperlipidemia   . Hypertension   . Hypothyroidism   . Lumbar disc disease   . MGUS (monoclonal gammopathy of unknown significance)   . Myocardial infarction ([place])   . Nephrolithiasis   . Osteoarthritis   . Peptic ulcer disease   . Renal insufficiency   . Rheumatoid arthritis [facility])     Surgical History: Past Surgical History:  Procedure Laterality Date  . CARDIAC CATHETERIZATION    . CHOLECYSTECTOMY    . COLONOSCOPY  03/2007, 2006  . CORONARY ARTERY BYPASS GRAFT    . ESOPHAGOGASTRODUODENOSCOPY (EGD) WITH PROPOFOL N/A 07/02/2015   Procedure: ESOPHAGOGASTRODUODENOSCOPY (EGD) WITH PROPOFOL;  Surgeon: Manya Silvas, MD;  Location: [facility] ENDOSCOPY;  Service: Endoscopy;  Laterality: N/A;  . EYE SURGERY    . JOINT REPLACEMENT    . REPLACEMENT TOTAL KNEE BILATERAL    . TOTAL HIP ARTHROPLASTY Right     Allergies:  Allergies  Allergen Reactions  . Gentamycin [Gentamicin] Other (See Comments)    Reaction:  Caused  kidney issues for pt   . Iodinated Diagnostic Agents     Other reaction(s): Other (See Comments) "Burning sensation throughout whole body"  . Meloxicam     Other reaction(s): Other (See Comments) GI UPSET  . Metrizamide     Other reaction(s): Other (See Comments) "Burning sensation throughout whole body"  . Penicillin V Potassium   . Celecoxib Nausea Only    Other reaction(s): Other (See Comments) GI upset  . Ciprofloxacin Nausea Only  . Hydrocodone Nausea Only  . Propranolol Palpitations    Family History: Family History  Problem Relation Age of Onset  . Heart attack Father   . Diabetes Father   . Heart attack Brother   . Prostate cancer Neg Hx   . Bladder Cancer Neg Hx   . Kidney cancer Neg Hx     Social History:  reports that he has quit smoking. His smoking use included cigarettes. His smokeless tobacco use includes chew. He reports that he does not drink alcohol or use drugs.  ROS: Please see flowsheet from today's date for complete review of systems.  Physical Exam: BP 131/71 (BP Location: Left Arm, Patient Position: Sitting, Cuff Size: Normal)   Pulse 64   Ht 5\' 9"  (1.753 m)   BMI 29.24 kg/m    Constitutional:  Alert and oriented, No acute distress, frail-appearing wheelchair-bound Cardiovascular: No clubbing, cyanosis, or edema. Respiratory: Normal respiratory effort, no increased work of breathing. GI: Abdomen is  soft, nontender, nondistended, no abdominal masses GU: No CVA tenderness, significant phimosis, unable to visualize glans.  This was gently stretched with a hemostat today. Lymph: No cervical or inguinal lymphadenopathy. Skin: No rashes, multiple bruising on the arms. Neurologic: Grossly intact, no focal deficits, moving all 4 extremities. Psychiatric: Normal mood and affect.  Assessment & Plan:   In summary, Mr. Hires is a very comorbid 77 year old male on apixaban who desires intervention for his phimosis.  He is having difficulty keeping his  glans clean and, and is having significant urinary post void dribbling and leakage.  We had a discussion about options moving forward including dorsal slit versus circumcision.  I think with his comorbidities he is much better suited to a dorsal slit in clinic using local anesthetic.  He would need to stop his apixaban at least 36 hours prior to the procedure, and ideally this will be held for at least 5 days post procedure.  If he is cleared from a primary care perspective to hold his apixaban for 1 week, we will schedule for a dorsal slit in clinic under local.  -If able to stop apixaban per PCP for total of one week, will schedule for dorsal slit in clinic. Need to hold apixaban 36 prior to procedure.    Billey Co, [facility] [address], [place], [place] [address] [phone]

## 2017-11-25 ENCOUNTER — Telehealth: Payer: Self-pay | Admitting: Urology

## 2017-11-25 NOTE — Telephone Encounter (Signed)
Pt wife called office stating that her husband, the pt's, PCP office has not received a fax asking to stop pt Eliquis for surgery. Pt wife is confused and wants some clarification. Please advise Mardene Celeste @ [phone]

## 2017-11-25 NOTE — Telephone Encounter (Signed)
Left mess to call 

## 2017-12-02 ENCOUNTER — Telehealth: Payer: Self-pay

## 2017-12-02 NOTE — Telephone Encounter (Signed)
Patient's wife notified that cardiac clearance was sent from Dr. Juanell Fairly office and it is ok to stop Eliquis 3 days prior to Dorsil Slit in office. Patient has been scheduled for 10-16@8 :45 so he should stop Eliquis on 10-13. Patient's wife verbalized understanding.

## 2017-12-30 ENCOUNTER — Ambulatory Visit: Payer: Medicare Other | Admitting: Urology

## 2018-01-10 ENCOUNTER — Other Ambulatory Visit: Payer: Self-pay

## 2018-01-10 ENCOUNTER — Emergency Department (HOSPITAL_COMMUNITY)
Admission: EM | Admit: 2018-01-10 | Discharge: 2018-01-10 | Disposition: A | Discharge status: Home / Self Care | Payer: Medicare Other | Attending: Emergency Medicine | Admitting: Emergency Medicine

## 2018-01-10 ENCOUNTER — Emergency Department (HOSPITAL_COMMUNITY): Payer: Medicare Other

## 2018-01-10 ENCOUNTER — Encounter (HOSPITAL_COMMUNITY): Payer: Self-pay | Admitting: Emergency Medicine

## 2018-01-10 DIAGNOSIS — S299XXA Unspecified injury of thorax, initial encounter: Secondary | ICD-10-CM | POA: Insufficient documentation

## 2018-01-10 DIAGNOSIS — I10 Essential (primary) hypertension: Secondary | ICD-10-CM | POA: Diagnosis not present

## 2018-01-10 DIAGNOSIS — Y998 Other external cause status: Secondary | ICD-10-CM | POA: Diagnosis not present

## 2018-01-10 DIAGNOSIS — R51 Headache: Secondary | ICD-10-CM | POA: Diagnosis present

## 2018-01-10 DIAGNOSIS — Z23 Encounter for immunization: Secondary | ICD-10-CM | POA: Insufficient documentation

## 2018-01-10 DIAGNOSIS — Y9301 Activity, walking, marching and hiking: Secondary | ICD-10-CM | POA: Insufficient documentation

## 2018-01-10 DIAGNOSIS — W1839XA Other fall on same level, initial encounter: Secondary | ICD-10-CM | POA: Insufficient documentation

## 2018-01-10 DIAGNOSIS — S060X0A Concussion without loss of consciousness, initial encounter: Secondary | ICD-10-CM | POA: Diagnosis not present

## 2018-01-10 DIAGNOSIS — Z794 Long term (current) use of insulin: Secondary | ICD-10-CM | POA: Insufficient documentation

## 2018-01-10 DIAGNOSIS — S161XXA Strain of muscle, fascia and tendon at neck level, initial encounter: Secondary | ICD-10-CM | POA: Diagnosis not present

## 2018-01-10 DIAGNOSIS — S51012A Laceration without foreign body of left elbow, initial encounter: Secondary | ICD-10-CM | POA: Insufficient documentation

## 2018-01-10 DIAGNOSIS — I2510 Atherosclerotic heart disease of native coronary artery without angina pectoris: Secondary | ICD-10-CM | POA: Diagnosis not present

## 2018-01-10 DIAGNOSIS — Z7901 Long term (current) use of anticoagulants: Secondary | ICD-10-CM | POA: Diagnosis not present

## 2018-01-10 DIAGNOSIS — W19XXXA Unspecified fall, initial encounter: Secondary | ICD-10-CM

## 2018-01-10 DIAGNOSIS — Z87891 Personal history of nicotine dependence: Secondary | ICD-10-CM | POA: Insufficient documentation

## 2018-01-10 DIAGNOSIS — Y92002 Bathroom of unspecified non-institutional (private) residence single-family (private) house as the place of occurrence of the external cause: Secondary | ICD-10-CM | POA: Diagnosis not present

## 2018-01-10 DIAGNOSIS — S298XXA Other specified injuries of thorax, initial encounter: Secondary | ICD-10-CM

## 2018-01-10 DIAGNOSIS — E119 Type 2 diabetes mellitus without complications: Secondary | ICD-10-CM | POA: Insufficient documentation

## 2018-01-10 MED ORDER — TETANUS-DIPHTH-ACELL PERTUSSIS 5-2.5-18.5 LF-MCG/0.5 IM SUSP
0.5000 mL | Freq: Once | INTRAMUSCULAR | Status: AC
Start: 2018-01-10 — End: 2018-01-10
  Administered 2018-01-10: 0.5 mL via INTRAMUSCULAR
  Filled 2018-01-10: qty 0.5

## 2018-01-10 MED ORDER — OXYCODONE-ACETAMINOPHEN 5-325 MG PO TABS
1.0000 | ORAL_TABLET | Freq: Once | ORAL | Status: AC
  Administered 2018-01-10: 1 via ORAL
  Filled 2018-01-10: qty 1

## 2018-01-10 NOTE — ED Notes (Signed)
Pt now c/o of neck, back and headache. Dr Christy Gentles aware

## 2018-01-10 NOTE — Discharge Instructions (Addendum)

## 2018-01-10 NOTE — ED Triage Notes (Signed)
Pt comes in from home via [facility]. Pt states he was using his walker in the bathroom this evening and lost his balance and fell. Pt C/O pain in the thoracic area of his back. Pt does report hitting head. Pt has abrasions to the left arm.

## 2018-01-10 NOTE — ED Triage Notes (Signed)
Pt had oxycodone(5/325)per family about 10:30 last night after falling

## 2018-01-10 NOTE — ED Provider Notes (Signed)
[facility] EMERGENCY DEPARTMENT Provider Note   CSN: [id number] Arrival date & time: 01/10/18  0102     History   Chief Complaint Chief Complaint  Patient presents with  . Fall    HPI Sean Armstrong is a 77 y.o. male.  The history is provided by the patient and a relative.  Fall  This is a new problem. Episode onset: several hours ago. The problem occurs constantly. The problem has not changed since onset.Associated symptoms include headaches. Pertinent negatives include no chest pain and no abdominal pain. Nothing aggravates the symptoms. Nothing relieves the symptoms.   Patient with history of CAD, diabetes presents with fall.  He reports he was using his walker and he lost his balance and fell backwards.  He reports hitting his head.  No LOC.  He reports headache and neck pain.  He also reports abrasion to his left arm.  He also reports upper back pain.  No chest pain or shortness of breath.  No preceding dizziness prior to fall.  No abdominal pain. Past Medical History:  Diagnosis Date  . Anemia   . Colon polyp   . Coronary artery disease   . Diabetes mellitus without complication ([place])   . FHx: SVT (supraventricular tachycardia) 1998  . GERD (gastroesophageal reflux disease)   . History of DVT (deep vein thrombosis) 2009  . History of pulmonary embolism 08/2008  . Hyperlipidemia   . Hypertension   . Hypothyroidism   . Lumbar disc disease   . MGUS (monoclonal gammopathy of unknown significance)   . Myocardial infarction ([place])   . Nephrolithiasis   . Osteoarthritis   . Peptic ulcer disease   . Renal insufficiency   . Rheumatoid arthritis [facility])     Patient Active Problem List   Diagnosis Date Noted  . AAA (abdominal aortic aneurysm) without rupture ([place]) 09/29/2016  . PAD (peripheral artery disease) ([place]) 09/29/2016  . Rib fracture 08/08/2016  . Constipation 08/08/2016  . Deep vein thrombosis (DVT) of popliteal vein of right lower extremity ([place]) 06/18/2016    . Left leg weakness 12/05/2015  . UTI (lower urinary tract infection) 12/05/2015  . Diabetes ([place]) 12/05/2015  . Anxiety 12/05/2015  . GERD (gastroesophageal reflux disease) 12/05/2015  . HLD (hyperlipidemia) 12/05/2015  . BPH (benign prostatic hyperplasia) 12/05/2015  . A-fib ([place]) 12/05/2015  . Sepsis ([place]) 06/18/2015    Past Surgical History:  Procedure Laterality Date  . CARDIAC CATHETERIZATION    . CHOLECYSTECTOMY    . COLONOSCOPY  03/2007, 2006  . CORONARY ARTERY BYPASS GRAFT    . ESOPHAGOGASTRODUODENOSCOPY (EGD) WITH PROPOFOL N/A 07/02/2015   Procedure: ESOPHAGOGASTRODUODENOSCOPY (EGD) WITH PROPOFOL;  Surgeon: Manya Silvas, MD;  Location[facility] ENDOSCOPY;  Service: Endoscopy;  Laterality: N/A;  . EYE SURGERY    . JOINT REPLACEMENT    . REPLACEMENT TOTAL KNEE BILATERAL    . TOTAL HIP ARTHROPLASTY Right         Home Medications    Prior to Admission medications   Medication Sig Start Date End Date Taking? Authorizing Provider  apixaban (ELIQUIS) 5 MG TABS tablet Take 1 tablet (5 mg total) by mouth 2 (two) times daily. 12/07/15   Hillary Bow, MD  atenolol (TENORMIN) 25 MG tablet Take 0.5 tablets (12.5 mg total) by mouth daily. 12/07/15   Hillary Bow, MD  atorvastatin (LIPITOR) 80 MG tablet Take 1 tablet (80 mg total) by mouth daily at 6 PM. 12/07/15   Hillary Bow, MD  bacitracin ointment Apply  to affected area daily x 10 days 03/19/17 03/19/18  Harvest Dark, MD  carbidopa-levodopa (PARCOPA) 25-100 MG disintegrating tablet Take 1 tablet by mouth 3 (three) times daily.     [provider]  doxycycline (VIBRA-TABS) 100 MG tablet Take 1 tablet (100 mg total) by mouth 2 (two) times daily. 08/10/17   Lavonia Drafts, MD  fluticasone (FLONASE) 50 MCG/ACT nasal spray Place 2 sprays into both nostrils daily.    [provider]  folic acid (FOLVITE) 1 MG tablet Take 1 mg by mouth daily.    [provider]  gabapentin (NEURONTIN) 400 MG capsule  Take 400 mg by mouth daily.     [provider]  glimepiride (AMARYL) 2 MG tablet Take 2 mg by mouth 2 (two) times daily.     [provider]  Insulin Detemir (LEVEMIR FLEXTOUCH) 100 UNIT/ML Pen Inject 30 Units into the skin at bedtime. Patient taking differently: Inject 25 Units into the skin at bedtime.  08/10/16   Fritzi Mandes, MD  loperamide (IMODIUM) 2 MG capsule Take 1 capsule (2 mg total) by mouth every 4 (four) hours as needed for diarrhea or loose stools. 06/20/15   Hillary Bow, MD  LORazepam (ATIVAN) 0.5 MG tablet Take 0.5 mg by mouth 2 (two) times daily as needed for anxiety.     [provider]  methotrexate (RHEUMATREX) 2.5 MG tablet Take 12.5 mg by mouth once a week. Pt takes on Wednesday.  Caution:Chemotherapy. Protect from light.    [provider]  Multiple Vitamins-Minerals (CENTRUM SILVER PO) Take 1 tablet by mouth daily.     [provider]  omeprazole (PRILOSEC) 40 MG capsule Take 40 mg by mouth daily.    [provider]  oxyCODONE-acetaminophen (PERCOCET) 5-325 MG tablet Take 1-2 tablets by mouth every 6 (six) hours as needed for moderate pain or severe pain. 08/08/16   Earleen Newport, MD  polyethylene glycol powder (GLYCOLAX/MIRALAX) powder 2 cap fulls in a full glass of water, three times a day, for 5 days. 08/07/16   Carrie Mew, MD  potassium chloride SA (K-DUR,KLOR-CON) 20 MEQ tablet Take 40 mEq by mouth daily.     [provider]  predniSONE (DELTASONE) 5 MG tablet Take 5 mg by mouth daily with breakfast.    [provider]  senna-docusate (SENOKOT-S) 8.6-50 MG tablet Take 2 tablets by mouth 2 (two) times daily. 08/07/16   Carrie Mew, MD  sucralfate (CARAFATE) 1 g tablet Take 1 g by mouth 2 (two) times daily.    [provider]  tamsulosin (FLOMAX) 0.4 MG CAPS capsule Take 0.4 mg by mouth daily after breakfast.     [provider]  traMADol (ULTRAM) 50 MG tablet Take  50 mg by mouth every 6 (six) hours as needed.  01/22/15   [provider]  triamterene-hydrochlorothiazide (MAXZIDE) 75-50 MG tablet Take 1 tablet by mouth daily.    [provider]    Family History Family History  Problem Relation Age of Onset  . Heart attack Father   . Diabetes Father   . Heart attack Brother   . Prostate cancer Neg Hx   . Bladder Cancer Neg Hx   . Kidney cancer Neg Hx     Social History Social History   Tobacco Use  . Smoking status: Former Smoker    Types: Cigarettes  . Smokeless tobacco: Current User    Types: Chew  . Tobacco comment: quit smoking cigerates 1995  Substance Use Topics  .  Alcohol use: No    Alcohol/week: 0.0 standard drinks  . Drug use: No     Allergies   Gentamycin [gentamicin]; Iodinated diagnostic agents; Meloxicam; Metrizamide; Penicillin v potassium; Celecoxib; Ciprofloxacin; Hydrocodone; and Propranolol   Review of Systems Review of Systems  Constitutional: Negative for fever.  Cardiovascular: Negative for chest pain.  Gastrointestinal: Negative for abdominal pain.  Musculoskeletal: Positive for back pain and neck pain.  Skin: Positive for wound.  Neurological: Positive for headaches. Negative for dizziness.  All other systems reviewed and are negative.    Physical Exam Updated Vital Signs BP (!) 154/66   Pulse 64   Temp 98.3 F (36.8 C) (Oral)   Resp 17   Wt 89.8 kg   SpO2 100%   BMI 29.24 kg/m   Physical Exam CONSTITUTIONAL: elderly and frail HEAD: Normocephalic/atraumatic EYES: EOMI/PERRL ENMT: Mucous membranes moist NECK: supple no meningeal signs SPINE/BACK: Mild C-spine tenderness, no thoracic tenderness, thoracic paraspinal tenderness noted, no lumbar tenderness, no bruising/crepitance/stepoffs noted to spine CV: S1/S2 noted LUNGS: Lungs are clear to auscultation bilaterally, no apparent distress ABDOMEN: soft, nontender NEURO: Pt is awake/alert/appropriate, moves all  extremitiesx4.  No facial droop.   EXTREMITIES: pulses normal/equal, full ROM Abrasions noted to left forearm, no lacerations.  No focal bony tenderness. Pelvis stable All other extremities/joints palpated/ranged and nontender SKIN: warm, color normal   ED Treatments / Results  Labs (all labs ordered are listed, but only abnormal results are displayed) Labs Reviewed - No data to display  EKG None  Radiology Ct Head Wo Contrast  Result Date: 01/10/2018 CLINICAL DATA:  77 year old male with head trauma. EXAM: CT HEAD WITHOUT CONTRAST CT CERVICAL SPINE WITHOUT CONTRAST TECHNIQUE: Multidetector CT imaging of the head and cervical spine was performed following the standard protocol without intravenous contrast. Multiplanar CT image reconstructions of the cervical spine were also generated. COMPARISON:  Head CT dated 03/19/2017 FINDINGS: CT HEAD FINDINGS Brain: There is mild age-related atrophy and chronic microvascular ischemic changes. There is no acute intracranial hemorrhage. No mass effect or midline shift. No extra-axial fluid collection. Vascular: No hyperdense vessel or unexpected calcification. Skull: Normal. Negative for fracture or focal lesion. Sinuses/Orbits: No acute finding. Other: None CT CERVICAL SPINE FINDINGS Alignment: No acute subluxation. Skull base and vertebrae: No acute fracture. Multilevel anterior osteophytes again noted. Soft tissues and spinal canal: No prevertebral fluid or swelling. No visible canal hematoma. Disc levels: There are degenerative changes and multilevel facet hypertrophy similar to prior CT. Upper chest: Negative. Other: Bilateral carotid bulb calcified plaques. IMPRESSION: 1. No acute intracranial hemorrhage. 2. No acute/traumatic cervical spine pathology. Electronically Signed   By: Anner Crete M.D.   On: 01/10/2018 02:47   Ct Cervical Spine Wo Contrast  Result Date: 01/10/2018 CLINICAL DATA:  77 year old male with head trauma. EXAM: CT HEAD  WITHOUT CONTRAST CT CERVICAL SPINE WITHOUT CONTRAST TECHNIQUE: Multidetector CT imaging of the head and cervical spine was performed following the standard protocol without intravenous contrast. Multiplanar CT image reconstructions of the cervical spine were also generated. COMPARISON:  Head CT dated 03/19/2017 FINDINGS: CT HEAD FINDINGS Brain: There is mild age-related atrophy and chronic microvascular ischemic changes. There is no acute intracranial hemorrhage. No mass effect or midline shift. No extra-axial fluid collection. Vascular: No hyperdense vessel or unexpected calcification. Skull: Normal. Negative for fracture or focal lesion. Sinuses/Orbits: No acute finding. Other: None CT CERVICAL SPINE FINDINGS Alignment: No acute subluxation. Skull base and vertebrae: No acute fracture. Multilevel anterior osteophytes again noted. Soft tissues  and spinal canal: No prevertebral fluid or swelling. No visible canal hematoma. Disc levels: There are degenerative changes and multilevel facet hypertrophy similar to prior CT. Upper chest: Negative. Other: Bilateral carotid bulb calcified plaques. IMPRESSION: 1. No acute intracranial hemorrhage. 2. No acute/traumatic cervical spine pathology. Electronically Signed   By: Anner Crete M.D.   On: 01/10/2018 02:47   Dg Chest Port 1 View  Result Date: 01/10/2018 CLINICAL DATA:  77 y/o M; loss of balance and fall. Thoracic back pain. EXAM: PORTABLE CHEST 1 VIEW COMPARISON:  02/10/2017 chest radiograph FINDINGS: The heart size and mediastinal contours are within normal limits. Calcific aortic atherosclerosis. Both lungs are clear. The visualized skeletal structures are unremarkable. Thoracic spine poorly visualized due to overlying soft tissues. IMPRESSION: No acute pulmonary process identified. Electronically Signed   By: Kristine Garbe M.D.   On: 01/10/2018 03:43    Procedures Procedures (including critical care time)  Medications Ordered in  ED Medications  oxyCODONE-acetaminophen (PERCOCET/ROXICET) 5-325 MG per tablet 1 tablet (1 tablet Oral Given 01/10/18 0320)  Tdap (BOOSTRIX) injection 0.5 mL (0.5 mLs Intramuscular Given 01/10/18 0320)     Initial Impression / Assessment and Plan / ED Course  I have reviewed the triage vital signs and the nursing notes.  Pertinent  imaging results that were available during my care of the patient were reviewed by me and considered in my medical decision making (see chart for details).     3:08 AM Patient presents after falling at home.  No LOC.  He is on anticoagulation.  CT head and C-spine are negative.  I also added on a chest x-ray due to upper back pain.  Patient otherwise at his baseline 3:47 AM Imaging negative.  Patient appropriate for discharge home.  Discussed signs of delayed ICH in this patient with use of DOAC Final Clinical Impressions(s) / ED Diagnoses   Final diagnoses:  Fall, initial encounter  Concussion without loss of consciousness, initial encounter  Acute cervical myofascial strain, initial encounter  Blunt trauma to chest, initial encounter  Skin tear of left elbow without complication, initial encounter    ED Discharge Orders    None       Ripley Fraise, MD 01/10/18 [phone]

## 2018-02-25 ENCOUNTER — Ambulatory Visit (INDEPENDENT_AMBULATORY_CARE_PROVIDER_SITE_OTHER): Payer: Medicare Other

## 2018-02-25 ENCOUNTER — Encounter (INDEPENDENT_AMBULATORY_CARE_PROVIDER_SITE_OTHER): Payer: Self-pay | Admitting: Vascular Surgery

## 2018-02-25 ENCOUNTER — Other Ambulatory Visit (INDEPENDENT_AMBULATORY_CARE_PROVIDER_SITE_OTHER): Payer: Self-pay | Admitting: Vascular Surgery

## 2018-02-25 ENCOUNTER — Ambulatory Visit (INDEPENDENT_AMBULATORY_CARE_PROVIDER_SITE_OTHER): Payer: Medicare Other | Admitting: Vascular Surgery

## 2018-02-25 ENCOUNTER — Encounter

## 2018-02-25 VITALS — BP 142/75 | HR 63 | Resp 18 | Ht 68.0 in | Wt 171.4 lb

## 2018-02-25 DIAGNOSIS — I6523 Occlusion and stenosis of bilateral carotid arteries: Secondary | ICD-10-CM

## 2018-02-25 DIAGNOSIS — I82431 Acute embolism and thrombosis of right popliteal vein: Secondary | ICD-10-CM | POA: Diagnosis not present

## 2018-02-25 DIAGNOSIS — I714 Abdominal aortic aneurysm, without rupture, unspecified: Secondary | ICD-10-CM

## 2018-02-25 DIAGNOSIS — E1151 Type 2 diabetes mellitus with diabetic peripheral angiopathy without gangrene: Secondary | ICD-10-CM

## 2018-02-25 DIAGNOSIS — I739 Peripheral vascular disease, unspecified: Secondary | ICD-10-CM | POA: Diagnosis not present

## 2018-02-25 DIAGNOSIS — I6529 Occlusion and stenosis of unspecified carotid artery: Secondary | ICD-10-CM | POA: Insufficient documentation

## 2018-02-25 DIAGNOSIS — I4891 Unspecified atrial fibrillation: Secondary | ICD-10-CM

## 2018-02-25 NOTE — Progress Notes (Signed)
MRN : [id number]  Sean Armstrong is a 77 y.o. (1941/03/09) male who presents with chief complaint of No chief complaint on file. Marland Kitchen  History of Present Illness:   The patient returns to the office for surveillance of a known abdominal aortic aneurysm. Patient denies abdominal pain or back pain, no other abdominal complaints. No changes suggesting embolic episodes.   There have been no interval changes in the patient's overall health care since his last visit.  Patient denies amaurosis fugax or TIA symptoms. There is no history of claudication or rest pain symptoms of the lower extremities. The patient denies angina or shortness of breath.   Duplex US of the aorta and iliac arteries shows an AAA measured 4.3 cm.  (previous AAA duplex dated 09/29/2017, showed an AAA that  measured 4.3 cm.)    No outpatient medications have been marked as taking for the 02/25/18 encounter (Appointment) with Delana Meyer, Dolores Lory, MD.    Past Medical History:  Diagnosis Date  . Anemia   . Colon polyp   . Coronary artery disease   . Diabetes mellitus without complication ([place])   . FHx: SVT (supraventricular tachycardia) 1998  . GERD (gastroesophageal reflux disease)   . History of DVT (deep vein thrombosis) 2009  . History of pulmonary embolism 08/2008  . Hyperlipidemia   . Hypertension   . Hypothyroidism   . Lumbar disc disease   . MGUS (monoclonal gammopathy of unknown significance)   . Myocardial infarction ([place])   . Nephrolithiasis   . Osteoarthritis   . Peptic ulcer disease   . Renal insufficiency   . Rheumatoid arthritis [facility])     Past Surgical History:  Procedure Laterality Date  . CARDIAC CATHETERIZATION    . CHOLECYSTECTOMY    . COLONOSCOPY  03/2007, 2006  . CORONARY ARTERY BYPASS GRAFT    . ESOPHAGOGASTRODUODENOSCOPY (EGD) WITH PROPOFOL N/A 07/02/2015   Procedure: ESOPHAGOGASTRODUODENOSCOPY (EGD) WITH PROPOFOL;  Surgeon: Manya Silvas, MD;  Location: [facility] ENDOSCOPY;  Service:  Endoscopy;  Laterality: N/A;  . EYE SURGERY    . JOINT REPLACEMENT    . REPLACEMENT TOTAL KNEE BILATERAL    . TOTAL HIP ARTHROPLASTY Right     Social History Social History   Tobacco Use  . Smoking status: Former Smoker    Types: Cigarettes  . Smokeless tobacco: Current User    Types: Chew  . Tobacco comment: quit smoking cigerates 1995  Substance Use Topics  . Alcohol use: No    Alcohol/week: 0.0 standard drinks  . Drug use: No    Family History Family History  Problem Relation Age of Onset  . Heart attack Father   . Diabetes Father   . Heart attack Brother   . Prostate cancer Neg Hx   . Bladder Cancer Neg Hx   . Kidney cancer Neg Hx     Allergies  Allergen Reactions  . Gentamycin [Gentamicin] Other (See Comments)    Reaction:  Caused kidney issues for pt   . Iodinated Diagnostic Agents     Other reaction(s): Other (See Comments) "Burning sensation throughout whole body"  . Meloxicam     Other reaction(s): Other (See Comments) GI UPSET  . Metrizamide     Other reaction(s): Other (See Comments) "Burning sensation throughout whole body"  . Penicillin V Potassium   . Celecoxib Nausea Only    Other reaction(s): Other (See Comments) GI upset  . Ciprofloxacin Nausea Only  . Hydrocodone Nausea Only  . Propranolol Palpitations  REVIEW OF SYSTEMS (Negative unless checked)  Constitutional: [] Weight loss  [] Fever  [] Chills Cardiac: [] Chest pain   [] Chest pressure   [] Palpitations   [] Shortness of breath when laying flat   [] Shortness of breath with exertion. Vascular:  [] Pain in legs with walking   [] Pain in legs at rest  [x] History of DVT   [] Phlebitis   [x] Swelling in legs   [] Varicose veins   [] Non-healing ulcers Pulmonary:   [] Uses home oxygen   [] Productive cough   [] Hemoptysis   [] Wheeze  [] COPD   [] Asthma Neurologic:  [] Dizziness   [] Seizures   [x] History of stroke   [] History of TIA  [] Aphasia   [] Vissual changes   [] Weakness or numbness in arm    [x] Weakness or numbness in leg Musculoskeletal:   [] Joint swelling   [] Joint pain   [] Low back pain Hematologic:  [] Easy bruising  [] Easy bleeding   [] Hypercoagulable state   [] Anemic Gastrointestinal:  [] Diarrhea   [] Vomiting  [] Gastroesophageal reflux/heartburn   [] Difficulty swallowing. Genitourinary:  [] Chronic kidney disease   [] Difficult urination  [] Frequent urination   [] Blood in urine Skin:  [] Rashes   [] Ulcers  Psychological:  [] History of anxiety   []  History of major depression.  Physical Examination  There were no vitals filed for this visit. There is no height or weight on file to calculate BMI. Gen: WD/WN, NAD Head: [place]/AT, No temporalis wasting.  Ear/Nose/Throat: Hearing grossly intact, nares w/o erythema or drainage Eyes: PER, EOMI, sclera nonicteric.  Neck: Supple, no large masses.   Pulmonary:  Good air movement, no audible wheezing bilaterally, no use of accessory muscles.  Cardiac: RRR, no JVD Vascular: scattered varicosities present bilaterally.  Mild venous stasis changes to the legs bilaterally.  3+ soft pitting edema Vessel Right Left  Radial Palpable Palpable  PT Palpable Trace Palpable  DP Palpable Trace Palpable  Gastrointestinal: Non-distended. No guarding/no peritoneal signs.  Musculoskeletal: M/S 5/5 right arm and leg 3-4/5 left arm and leg.  No deformity mild left sided atrophy.  Neurologic: CN 2-12 intact. Symmetrical.  Speech is fluent. Motor exam as listed above. Psychiatric: Judgment intact, Mood & affect appropriate for pt's clinical situation. Dermatologic: No rashes or ulcers noted.  No changes consistent with cellulitis. Lymph : No lichenification or skin changes of chronic lymphedema.  CBC Lab Results  Component Value Date   WBC 11.2 (H) 08/10/2017   HGB 13.1 08/10/2017   HCT 39.5 (L) 08/10/2017   MCV 87.2 08/10/2017   PLT 191 08/10/2017    BMET    Component Value Date/Time   NA 136 08/10/2017 1206   K 3.5 08/10/2017 1206   CL 103  08/10/2017 1206   CO2 26 08/10/2017 1206   GLUCOSE 206 (H) 08/10/2017 1206   BUN 19 08/10/2017 1206   CREATININE 0.98 08/10/2017 1206   CREATININE 1.59 (H) 06/23/2014 1115   CALCIUM 10.0 08/10/2017 1206   CALCIUM 9.7 06/23/2014 1115   GFRNONAA >60 08/10/2017 1206   GFRNONAA 42 (L) 06/23/2014 1115   GFRAA >60 08/10/2017 1206   GFRAA 48 (L) 06/23/2014 1115   CrCl cannot be calculated (Patient's most recent lab result is older than the maximum 21 days allowed.).  COAG Lab Results  Component Value Date   INR 1.44 02/10/2017    Radiology No results found.   Assessment/Plan 1. AAA (abdominal aortic aneurysm) without rupture (HCC) No surgery or intervention at this time. The patient has an asymptomatic abdominal aortic aneurysm that is less than 4 cm in maximal diameter.  I have discussed the natural history of abdominal aortic aneurysm and the small risk of rupture for aneurysm less than 5 cm in size.  However, as these small aneurysms tend to enlarge over time, continued surveillance with ultrasound or CT scan is mandatory.  I have also discussed optimizing medical management with hypertension and lipid control and the importance of abstinence from tobacco.  The patient is also encouraged to exercise a minimum of 30 minutes 4 times a week.  Should the patient develop new onset abdominal or back pain or signs of peripheral embolization they are instructed to seek medical attention immediately and to alert the physician providing care that they have an aneurysm.  The patient voices their understanding. The patient will return in 12 months with an aortic duplex.  - VAS US AORTA/IVC/ILIACS; Future  2. PAD (peripheral artery disease) (HCC)  Recommend:  The patient has evidence of atherosclerosis of the lower extremities with claudication.  The patient does not voice lifestyle limiting changes at this point in time.  Noninvasive studies do not suggest clinically significant  change.  No invasive studies, angiography or surgery at this time The patient should continue walking and begin a more formal exercise program.  The patient should continue antiplatelet therapy and aggressive treatment of the lipid abnormalities  No changes in the patient's medications at this time  The patient should continue wearing graduated compression socks 10-15 mmHg strength to control the mild edema.   - VAS [place] ABI WITH/WO TBI; Future  3. Bilateral carotid artery stenosis Recommend:  Given the patient's asymptomatic subcritical stenosis no further invasive testing or surgery at this time.  Continue antiplatelet therapy as prescribed Continue management of CAD, HTN and Hyperlipidemia Healthy heart diet,  encouraged exercise at least 4 times per week Follow up in 12 months with duplex ultrasound and physical exam   - VAS US CAROTID; Future  4. Deep vein thrombosis (DVT) of popliteal vein of right lower extremity, unspecified chronicity (HCC) Continue Eliquis  5. Atrial fibrillation, unspecified type ([place]) Continue antiarrhythmia medications as already ordered, these medications have been reviewed and there are no changes at this time.  Continue anticoagulation as ordered by Cardiology Service   6. Type 2 diabetes mellitus with diabetic peripheral angiopathy without gangrene, unspecified whether long term insulin use ([place]) Continue hypoglycemic medications as already ordered, these medications have been reviewed and there are no changes at this time.  Hgb A1C to be monitored as already arranged by primary service    Hortencia Pilar, MD  02/25/2018 9:17 AM

## 2018-03-23 ENCOUNTER — Emergency Department
Admission: EM | Admit: 2018-03-23 | Discharge: 2018-03-23 | Disposition: A | Discharge status: Home / Self Care | Payer: Medicare Other | Attending: Emergency Medicine | Admitting: Emergency Medicine

## 2018-03-23 ENCOUNTER — Emergency Department: Payer: Medicare Other

## 2018-03-23 ENCOUNTER — Encounter: Payer: Self-pay | Admitting: Medical Oncology

## 2018-03-23 DIAGNOSIS — Z7901 Long term (current) use of anticoagulants: Secondary | ICD-10-CM | POA: Insufficient documentation

## 2018-03-23 DIAGNOSIS — S51012D Laceration without foreign body of left elbow, subsequent encounter: Secondary | ICD-10-CM | POA: Insufficient documentation

## 2018-03-23 DIAGNOSIS — Y92003 Bedroom of unspecified non-institutional (private) residence as the place of occurrence of the external cause: Secondary | ICD-10-CM | POA: Insufficient documentation

## 2018-03-23 DIAGNOSIS — W01190A Fall on same level from slipping, tripping and stumbling with subsequent striking against furniture, initial encounter: Secondary | ICD-10-CM | POA: Insufficient documentation

## 2018-03-23 DIAGNOSIS — F17220 Nicotine dependence, chewing tobacco, uncomplicated: Secondary | ICD-10-CM | POA: Insufficient documentation

## 2018-03-23 DIAGNOSIS — I2510 Atherosclerotic heart disease of native coronary artery without angina pectoris: Secondary | ICD-10-CM | POA: Diagnosis not present

## 2018-03-23 DIAGNOSIS — Y999 Unspecified external cause status: Secondary | ICD-10-CM | POA: Insufficient documentation

## 2018-03-23 DIAGNOSIS — E039 Hypothyroidism, unspecified: Secondary | ICD-10-CM | POA: Diagnosis not present

## 2018-03-23 DIAGNOSIS — I10 Essential (primary) hypertension: Secondary | ICD-10-CM | POA: Insufficient documentation

## 2018-03-23 DIAGNOSIS — S0101XA Laceration without foreign body of scalp, initial encounter: Secondary | ICD-10-CM | POA: Insufficient documentation

## 2018-03-23 DIAGNOSIS — S0990XA Unspecified injury of head, initial encounter: Secondary | ICD-10-CM | POA: Diagnosis present

## 2018-03-23 DIAGNOSIS — W19XXXA Unspecified fall, initial encounter: Secondary | ICD-10-CM

## 2018-03-23 DIAGNOSIS — Y9301 Activity, walking, marching and hiking: Secondary | ICD-10-CM | POA: Diagnosis not present

## 2018-03-23 DIAGNOSIS — E119 Type 2 diabetes mellitus without complications: Secondary | ICD-10-CM | POA: Insufficient documentation

## 2018-03-23 MED ORDER — BACITRACIN-NEOMYCIN-POLYMYXIN 400-5-5000 EX OINT
TOPICAL_OINTMENT | Freq: Once | CUTANEOUS | Status: AC
  Administered 2018-03-23: 1 via TOPICAL
  Filled 2018-03-23: qty 1

## 2018-03-23 MED ORDER — LIDOCAINE-EPINEPHRINE 2 %-1:100000 IJ SOLN
20.0000 mL | Freq: Once | INTRAMUSCULAR | Status: AC
  Administered 2018-03-23: 20 mL via INTRADERMAL
  Filled 2018-03-23: qty 1

## 2018-03-23 MED ORDER — LIDOCAINE-EPINEPHRINE-TETRACAINE (LET) SOLUTION
3.0000 mL | Freq: Once | NASAL | Status: AC
  Administered 2018-03-23: 3 mL via TOPICAL
  Filled 2018-03-23: qty 3

## 2018-03-23 NOTE — ED Triage Notes (Signed)
Pt from home, mechanical fall, hit rt side of head and left elbow. Lac to head and skin tear to left elbow. Pt denies LOC. A/O x 4. Pt on eliquis.

## 2018-03-23 NOTE — ED Notes (Signed)
Head cleaned before discharge.

## 2018-03-23 NOTE — ED Notes (Signed)
Neosporin and vasoline gauze applied to left elbow skin tear. Was cleaned with NS prior

## 2018-03-23 NOTE — Discharge Instructions (Addendum)
Please keep your walker near you at all times.  You may want to put your walker next to your bed so that you do not have to walk to get to your walker.  You may shower with your staples in place, but do not soak your head.  Please make an appoint with your primary care physician in 5 to 7 days for staple removal.  Return to the emergency department if you develop severe pain, lightheadedness or fainting, vomiting, concerns for infection around your laceration site including pus, significant swelling or pain, or any other symptoms concerning to you.

## 2018-03-23 NOTE — ED Notes (Signed)
Patient transported to CT 

## 2018-03-23 NOTE — ED Provider Notes (Addendum)
[facility] Emergency Department Provider Note  ____________________________________________  Time seen: Approximately 10:40 AM  I have reviewed the triage vital signs and the nursing notes.   HISTORY  Chief Complaint Fall and Head Injury    HPI Sean Armstrong is a 78 y.o. male w/ a hx of walker use and recurrent falls, on Eliquis for hx of afib and CVA, presenting for a fall with laceration to the scalp.  The pt reports that he was using the bed for stability, and lost his balance, tipped forward and hit the top of his head with the bureau, resulting in a large scalp lac.  He denies any LOC, neck pain, numbness tingling or weakness.  He did not have any associated lightheadedness or syncope, chest pain, palpitations.  He has multiple left elbow skin tears from a fall several days ago with no new injury.  He has been able to stand and walk since his fall without any extremity or back pain.  Past Medical History:  Diagnosis Date  . Anemia   . Colon polyp   . Coronary artery disease   . Diabetes mellitus without complication ([place])   . FHx: SVT (supraventricular tachycardia) 1998  . GERD (gastroesophageal reflux disease)   . History of DVT (deep vein thrombosis) 2009  . History of pulmonary embolism 08/2008  . Hyperlipidemia   . Hypertension   . Hypothyroidism   . Lumbar disc disease   . MGUS (monoclonal gammopathy of unknown significance)   . Myocardial infarction ([place])   . Nephrolithiasis   . Osteoarthritis   . Peptic ulcer disease   . Renal insufficiency   . Rheumatoid arthritis [facility])     Patient Active Problem List   Diagnosis Date Noted  . Carotid stenosis 02/25/2018  . AAA (abdominal aortic aneurysm) without rupture ([place]) 09/29/2016  . PAD (peripheral artery disease) ([place]) 09/29/2016  . Rib fracture 08/08/2016  . Constipation 08/08/2016  . Deep vein thrombosis (DVT) of popliteal vein of right lower extremity ([place]) 06/18/2016  . Left leg  weakness 12/05/2015  . UTI (lower urinary tract infection) 12/05/2015  . Diabetes ([place]) 12/05/2015  . Anxiety 12/05/2015  . GERD (gastroesophageal reflux disease) 12/05/2015  . HLD (hyperlipidemia) 12/05/2015  . BPH (benign prostatic hyperplasia) 12/05/2015  . A-fib ([place]) 12/05/2015  . Sepsis ([place]) 06/18/2015    Past Surgical History:  Procedure Laterality Date  . CARDIAC CATHETERIZATION    . CHOLECYSTECTOMY    . COLONOSCOPY  03/2007, 2006  . CORONARY ARTERY BYPASS GRAFT    . ESOPHAGOGASTRODUODENOSCOPY (EGD) WITH PROPOFOL N/A 07/02/2015   Procedure: ESOPHAGOGASTRODUODENOSCOPY (EGD) WITH PROPOFOL;  Surgeon: Manya Silvas, MD;  Location: [facility] ENDOSCOPY;  Service: Endoscopy;  Laterality: N/A;  . EYE SURGERY    . JOINT REPLACEMENT    . REPLACEMENT TOTAL KNEE BILATERAL    . TOTAL HIP ARTHROPLASTY Right     Current Outpatient Rx  . Order #: [id number] Class: Normal  . Order #: [id number] Class: Normal  . Order #: [id number] Class: Normal  . Order #: [id number] Class: Historical Med  . Order #: [id number] Class: Historical Med  . Order #: [id number] Class: Historical Med  . Order #: [id number] Class: Historical Med  . Order #: [id number] Class: Historical Med  . Order #: [id number] Class: Normal  . Order #: [id number] Class: Print  . Order #: [id number] Class: Historical Med  . Order #: [id number] Class: Historical Med  . Order #: [id number] Class: Historical Med  . Order #: [id number] Class: Print  . Order #: [id number] Class: Print  . Order #:  [id number] Class: Historical Med  . Order #: [id number] Class: Historical Med  . Order #: [id number] Class: Print  . Order #: [id number] Class: Historical Med  . Order #: [id number] Class: Historical Med  . Order #: [id number] Class: Historical Med  . Order #: [id number] Class: Historical Med    Allergies Gentamycin [gentamicin]; Iodinated diagnostic agents; Meloxicam; Metrizamide; Penicillin v potassium; Celecoxib; Ciprofloxacin; Hydrocodone; and Propranolol  Family  History  Problem Relation Age of Onset  . Heart attack Father   . Diabetes Father   . Heart attack Brother   . Prostate cancer Neg Hx   . Bladder Cancer Neg Hx   . Kidney cancer Neg Hx     Social History Social History   Tobacco Use  . Smoking status: Former Smoker    Types: Cigarettes  . Smokeless tobacco: Current User    Types: Chew  . Tobacco comment: quit smoking cigerates 1995  Substance Use Topics  . Alcohol use: No    Alcohol/week: 0.0 standard drinks  . Drug use: No    Review of Systems Constitutional: No fever/chills. No lightheadedness or syncope.  + Mechanical fall.  No LOC Eyes: No visual changes. No blurred or double vision. ENT:  No congestion or rhinorrhea. Cardiovascular: Denies chest pain. Denies palpitations. Respiratory: Denies shortness of breath.  No cough. Gastrointestinal: No abdominal pain.  No nausea, no vomiting.  No diarrhea.  No constipation. Genitourinary: Negative for dysuria. Musculoskeletal: Negative for back pain.  No neck pain. Skin: Negative for rash.  Positive scalp lack. Neurological: Negative for headaches. No focal numbness, tingling or weakness.     ____________________________________________   PHYSICAL EXAM:  VITAL SIGNS: ED Triage Vitals  Enc Vitals Group     BP 03/23/18 0953 137/84     Pulse Rate 03/23/18 0953 61     Resp 03/23/18 0953 16     Temp 03/23/18 0953 98.4 F (36.9 C)     Temp Source 03/23/18 0953 Oral     SpO2 03/23/18 0949 100 %     Weight 03/23/18 0954 175 lb (79.4 kg)     Height 03/23/18 0954 5\' 9"  (1.753 m)     Head Circumference --      Peak Flow --      Pain Score 03/23/18 0954 7     Pain Loc --      Pain Edu? --      Excl. in [place]? --     Constitutional: Alert and oriented.  Answers questions appropriately.  GCS is 15. Eyes: Conjunctivae are normal.  EOMI. No scleral icterus.  No raccoon eyes. Head: 5 inch linear laceration on the right side of the superior scalp that is hemostatic with  extension of superficial abrasion for another 5 inches.  No palpable instability of the skull.. Nose: No congestion/rhinnorhea.  No swelling over the nose or septal hematoma. Mouth/Throat: Mucous membranes are moist.  No malocclusion or dental injury. Neck: No stridor.  Supple.  No midline C-spine tenderness to palpation, step-offs or deformities.  Full range of motion without pain. Cardiovascular: Normal rate, regular rhythm. No murmurs, rubs or gallops.  Respiratory: Normal respiratory effort.  No accessory muscle use or retractions. Lungs CTAB.  No wheezes, rales or ronchi. Gastrointestinal: Soft, nontender and nondistended.  No guarding or rebound.  No peritoneal signs. Musculoskeletal: Pelvis is stable.  The patient has full range of motion of the bilateral shoulders, elbows, wrists, hips, knees and ankles without pain.  See skin exam for skin tears and bruising around the left elbow.  No LE edema. No ttp in the calves or palpable cords.  Negative Homan's sign. Neurologic:  A&Ox3.  Speech is clear.  Face and smile are symmetric.  EOMI.  Moves all extremities well. Skin: Patient has multiple well appearing skin tears over the left elbow without any purulent discharge, swelling, or erythema; these are associated with ecchymosis.  There is no effusion of the left elbow. Psychiatric: Mood and affect are normal.   ____________________________________________   LABS (all labs ordered are listed, but only abnormal results are displayed)  Labs Reviewed - No data to display ____________________________________________  EKG  ED ECG REPORT I, Anne-Caroline Mariea Clonts, the attending physician, personally viewed and interpreted this ECG.   Date: 03/23/2018  EKG Time: 1106  Rate: 63  Rhythm: afib  Axis: normal  Intervals:none  ST&T Change: No STEMI  ____________________________________________  RADIOLOGY  No results  found.  ____________________________________________   PROCEDURES  Procedure(s) performed: None  .Marland KitchenLaceration Repair Date/Time: 03/23/2018 11:57 AM Performed by: Eula Listen, MD Authorized by: Eula Listen, MD   Consent:    Consent obtained:  Verbal   Consent given by:  Patient   Risks discussed:  Infection, pain and poor wound healing Anesthesia (see MAR for exact dosages):    Anesthesia method:  Local infiltration and topical application   Topical anesthetic:  LET   Local anesthetic:  Lidocaine 2% WITH epi Laceration details:    Location:  Scalp   Scalp location:  Crown   Length (cm):  10 Repair type:    Repair type:  Simple Exploration:    Hemostasis achieved with:  Direct pressure and LET   Wound exploration: entire depth of wound probed and visualized     Contaminated: no   Treatment:    Area cleansed with:  Saline   Amount of cleaning:  Standard   Irrigation solution:  Sterile saline   Visualized foreign bodies/material removed: no   Skin repair:    Repair method:  Staples   Number of staples:  6 Approximation:    Approximation:  Close Post-procedure details:    Dressing:  Sterile dressing and non-adherent dressing   Patient tolerance of procedure:  Tolerated well, no immediate complications    Critical Care performed: No ____________________________________________   INITIAL IMPRESSION / ASSESSMENT AND PLAN / ED COURSE  Pertinent labs & imaging results that were available during my care of the patient were reviewed by me and considered in my medical decision making (see chart for details).  78 y.o. male on Eliquis with a mechanical fall presenting with scalp laceration.  She has old skin tears on the left elbow from a prior fall and no evidence of fracture on my exam clinically.  The patient's vital signs are normal.  We will do a CT of the head to rule out injury, plan scalp laceration repair, and a screening EKG.  I do not see any  evidence of cervical spine injury.  Plan reevaluation for final disposition.  ----------------------------------------- 11:58 AM on 03/23/2018 -----------------------------------------  The patient CT scan does not show any acute intracranial process.  He continues to be hemodynamically stable and symptom-free.  His scalp has been stapled.  At this time, the patient is safe for discharge home.  I discussed stable care, as well as follow-up instructions with the patient and his son.  Return precautions were given in person.  ____________________________________________  FINAL CLINICAL IMPRESSION(S) / ED DIAGNOSES  Final diagnoses:  Laceration of scalp, initial encounter  Fall, initial encounter  Skin  tear of left elbow without complication, subsequent encounter         NEW MEDICATIONS STARTED DURING THIS VISIT:  New Prescriptions   No medications on file      Eula Listen, MD 03/23/18 1108    Eula Listen, MD 03/23/18 1158    Eula Listen, MD 03/23/18 1159

## 2018-03-23 NOTE — ED Notes (Signed)
MD at bedside to staple head laceration

## 2018-03-30 ENCOUNTER — Other Ambulatory Visit: Payer: Self-pay | Admitting: Internal Medicine

## 2018-03-30 ENCOUNTER — Ambulatory Visit: Payer: Medicare Other | Admitting: Urology

## 2018-03-30 DIAGNOSIS — N6320 Unspecified lump in the left breast, unspecified quadrant: Secondary | ICD-10-CM

## 2018-04-05 ENCOUNTER — Ambulatory Visit
Admission: RE | Admit: 2018-04-05 | Discharge: 2018-04-05 | Disposition: A | Discharge status: Home / Self Care | Payer: Medicare Other | Source: Ambulatory Visit | Attending: Internal Medicine | Admitting: Internal Medicine

## 2018-04-05 DIAGNOSIS — N6320 Unspecified lump in the left breast, unspecified quadrant: Secondary | ICD-10-CM

## 2018-04-05 DIAGNOSIS — N62 Hypertrophy of breast: Secondary | ICD-10-CM | POA: Insufficient documentation

## 2018-08-24 ENCOUNTER — Ambulatory Visit: Payer: Medicare Other | Admitting: Urology

## 2018-09-10 ENCOUNTER — Ambulatory Visit
Admission: EM | Admit: 2018-09-10 | Discharge: 2018-09-10 | Disposition: A | Discharge status: Home / Self Care | Payer: Medicare Other | Attending: Family Medicine | Admitting: Family Medicine

## 2018-09-10 ENCOUNTER — Encounter: Payer: Self-pay | Admitting: Gynecology

## 2018-09-10 ENCOUNTER — Ambulatory Visit: Payer: Medicare Other

## 2018-09-10 ENCOUNTER — Other Ambulatory Visit: Payer: Self-pay

## 2018-09-10 DIAGNOSIS — L97519 Non-pressure chronic ulcer of other part of right foot with unspecified severity: Secondary | ICD-10-CM | POA: Diagnosis present

## 2018-09-10 DIAGNOSIS — E11621 Type 2 diabetes mellitus with foot ulcer: Secondary | ICD-10-CM | POA: Diagnosis present

## 2018-09-10 DIAGNOSIS — Z87891 Personal history of nicotine dependence: Secondary | ICD-10-CM | POA: Diagnosis not present

## 2018-09-10 DIAGNOSIS — L03115 Cellulitis of right lower limb: Secondary | ICD-10-CM | POA: Diagnosis present

## 2018-09-10 LAB — CBC WITH DIFFERENTIAL/PLATELET
Abs Immature Granulocytes: 0.06 10*3/uL (ref 0.00–0.07)
Basophils Absolute: 0 10*3/uL (ref 0.0–0.1)
Basophils Relative: 0 %
Eosinophils Absolute: 0.1 10*3/uL (ref 0.0–0.5)
Eosinophils Relative: 1 %
HCT: 35.2 % — ABNORMAL LOW (ref 39.0–52.0)
Hemoglobin: 11.2 g/dL — ABNORMAL LOW (ref 13.0–17.0)
Immature Granulocytes: 1 %
Lymphocytes Relative: 13 %
Lymphs Abs: 1.6 10*3/uL (ref 0.7–4.0)
MCH: 26.4 pg (ref 26.0–34.0)
MCHC: 31.8 g/dL (ref 30.0–36.0)
MCV: 83 fL (ref 80.0–100.0)
Monocytes Absolute: 0.9 10*3/uL (ref 0.1–1.0)
Monocytes Relative: 7 %
Neutro Abs: 9.7 10*3/uL — ABNORMAL HIGH (ref 1.7–7.7)
Neutrophils Relative %: 78 %
Platelets: 164 10*3/uL (ref 150–400)
RBC: 4.24 MIL/uL (ref 4.22–5.81)
RDW: 18.7 % — ABNORMAL HIGH (ref 11.5–15.5)
WBC: 12.4 10*3/uL — ABNORMAL HIGH (ref 4.0–10.5)
nRBC: 0 % (ref 0.0–0.2)

## 2018-09-10 LAB — COMPREHENSIVE METABOLIC PANEL
ALT: 9 U/L (ref 0–44)
AST: 24 U/L (ref 15–41)
Albumin: 3 g/dL — ABNORMAL LOW (ref 3.5–5.0)
Alkaline Phosphatase: 58 U/L (ref 38–126)
Anion gap: 8 (ref 5–15)
BUN: 28 mg/dL — ABNORMAL HIGH (ref 8–23)
CO2: 23 mmol/L (ref 22–32)
Calcium: 9.9 mg/dL (ref 8.9–10.3)
Chloride: 105 mmol/L (ref 98–111)
Creatinine, Ser: 1.27 mg/dL — ABNORMAL HIGH (ref 0.61–1.24)
GFR calc Af Amer: >60 mL/min (ref >60)
GFR calc non Af Amer: 53 mL/min — ABNORMAL LOW (ref >60)
Glucose, Bld: 176 mg/dL — ABNORMAL HIGH (ref 70–99)
Potassium: 4.4 mmol/L (ref 3.5–5.1)
Sodium: 136 mmol/L (ref 135–145)
Total Bilirubin: 0.8 mg/dL (ref 0.3–1.2)
Total Protein: 6.2 g/dL — ABNORMAL LOW (ref 6.5–8.1)

## 2018-09-10 MED ORDER — DOXYCYCLINE HYCLATE 100 MG PO CAPS: 100.0000 mg | ORAL_CAPSULE | Freq: Two times a day (BID) | ORAL | 0 refills | Status: DC

## 2018-09-10 MED ORDER — CLINDAMYCIN HCL 150 MG PO CAPS: 450.0000 mg | ORAL_CAPSULE | Freq: Three times a day (TID) | ORAL | 0 refills | Status: AC

## 2018-09-10 MED ORDER — LEVOFLOXACIN 500 MG PO TABS: 500.0000 mg | ORAL_TABLET | Freq: Every day | ORAL | 0 refills | Status: AC

## 2018-09-10 NOTE — Discharge Instructions (Signed)
See [facility] on [place] @ 915.  Keep wound dressed.  Antibiotic as prescribed.  If you worsen, go to the ER.  Take care  Dr. Lacinda Axon

## 2018-09-10 NOTE — ED Provider Notes (Signed)
[facility]    CSN: [id number] Arrival date & time: 09/10/18  0807  History   Chief Complaint Chief Complaint  Patient presents with  . Toe Injury   HPI  78 year old male with an extensive past medical history including type 2 diabetes and peripheral artery disease presents with a wound to his right great toe.  Patient has had at wound to the plantar aspect of his right second toe for 1 week.  Bleeds intermittently.  He reports associated pain.  Pain is currently 7/10 in severity.  He is unsure of how he got this wound.  He states that his blood sugars have been fairly well controlled.  Mostly in the low 100s.  No reports of fever.  He has recently developed surrounding redness which is extending proximally.  He reports warmth on the dorsum of his foot as well as erythema.  No documented fever.  Patient does state that he does not feel well.  His son states that the patient's wife has been "doctoring" his wound without improvement.  He does not currently see podiatry.  No other reported symptoms.  No other complaints or concerns at this time.  Past Medical History:  Diagnosis Date  . Anemia   . Colon polyp   . Coronary artery disease   . Diabetes mellitus without complication ([place])   . FHx: SVT (supraventricular tachycardia) 1998  . GERD (gastroesophageal reflux disease)   . History of DVT (deep vein thrombosis) 2009  . History of pulmonary embolism 08/2008  . Hyperlipidemia   . Hypertension   . Hypothyroidism   . Lumbar disc disease   . MGUS (monoclonal gammopathy of unknown significance)   . Myocardial infarction ([place])   . Nephrolithiasis   . Osteoarthritis   . Peptic ulcer disease   . Renal insufficiency   . Rheumatoid arthritis [facility])     Patient Active Problem List   Diagnosis Date Noted  . Carotid stenosis 02/25/2018  . AAA (abdominal aortic aneurysm) without rupture ([place]) 09/29/2016  . PAD (peripheral artery disease) ([place]) 09/29/2016  . Rib fracture  08/08/2016  . Constipation 08/08/2016  . Deep vein thrombosis (DVT) of popliteal vein of right lower extremity ([place]) 06/18/2016  . Left leg weakness 12/05/2015  . UTI (lower urinary tract infection) 12/05/2015  . Diabetes ([place]) 12/05/2015  . Anxiety 12/05/2015  . GERD (gastroesophageal reflux disease) 12/05/2015  . HLD (hyperlipidemia) 12/05/2015  . BPH (benign prostatic hyperplasia) 12/05/2015  . A-fib ([place]) 12/05/2015  . Sepsis ([place]) 06/18/2015    Past Surgical History:  Procedure Laterality Date  . CARDIAC CATHETERIZATION    . CHOLECYSTECTOMY    . COLONOSCOPY  03/2007, 2006  . CORONARY ARTERY BYPASS GRAFT    . ESOPHAGOGASTRODUODENOSCOPY (EGD) WITH PROPOFOL N/A 07/02/2015   Procedure: ESOPHAGOGASTRODUODENOSCOPY (EGD) WITH PROPOFOL;  Surgeon: Manya Silvas, MD;  Location: [facility] ENDOSCOPY;  Service: Endoscopy;  Laterality: N/A;  . EYE SURGERY    . JOINT REPLACEMENT    . REPLACEMENT TOTAL KNEE BILATERAL    . TOTAL HIP ARTHROPLASTY Right        Home Medications    Prior to Admission medications   Medication Sig Start Date End Date Taking? Authorizing Provider  apixaban (ELIQUIS) 5 MG TABS tablet Take 1 tablet (5 mg total) by mouth 2 (two) times daily. 12/07/15  Yes Sudini, Alveta Heimlich, MD  atenolol (TENORMIN) 25 MG tablet Take 0.5 tablets (12.5 mg total) by mouth daily. 12/07/15  Yes Hillary Bow, MD  atorvastatin (LIPITOR) 80  MG tablet Take 1 tablet (80 mg total) by mouth daily at 6 PM. 12/07/15  Yes Sudini, Srikar, MD  carbidopa-levodopa (PARCOPA) 25-100 MG disintegrating tablet Take 1 tablet by mouth 3 (three) times daily.    Yes [provider]  fluticasone (FLONASE) 50 MCG/ACT nasal spray Place 2 sprays into both nostrils daily.   Yes [provider]  folic acid (FOLVITE) 1 MG tablet Take 1 mg by mouth daily.   Yes [provider]  gabapentin (NEURONTIN) 400 MG capsule Take 400 mg by mouth daily.    Yes [provider]  glimepiride (AMARYL) 2  MG tablet Take 2 mg by mouth 2 (two) times daily.    Yes [provider]  Insulin Detemir (LEVEMIR FLEXTOUCH) 100 UNIT/ML Pen Inject 30 Units into the skin at bedtime. Patient taking differently: Inject 25 Units into the skin at bedtime.  08/10/16  Yes Fritzi Mandes, MD  loperamide (IMODIUM) 2 MG capsule Take 1 capsule (2 mg total) by mouth every 4 (four) hours as needed for diarrhea or loose stools. 06/20/15  Yes Sudini, Alveta Heimlich, MD  LORazepam (ATIVAN) 0.5 MG tablet Take 0.5 mg by mouth 2 (two) times daily as needed for anxiety.    Yes [provider]  Multiple Vitamins-Minerals (CENTRUM SILVER PO) Take 1 tablet by mouth daily.    Yes [provider]  omeprazole (PRILOSEC) 40 MG capsule Take 40 mg by mouth daily.   Yes [provider]  oxyCODONE-acetaminophen (PERCOCET) 5-325 MG tablet Take 1-2 tablets by mouth every 6 (six) hours as needed for moderate pain or severe pain. 08/08/16  Yes Earleen Newport, MD  polyethylene glycol powder (GLYCOLAX/MIRALAX) powder 2 cap fulls in a full glass of water, three times a day, for 5 days. 08/07/16  Yes Carrie Mew, MD  potassium chloride SA (K-DUR,KLOR-CON) 20 MEQ tablet Take 40 mEq by mouth daily.    Yes [provider]  predniSONE (DELTASONE) 5 MG tablet Take 5 mg by mouth daily with breakfast.   Yes [provider]  sucralfate (CARAFATE) 1 g tablet Take 1 g by mouth 2 (two) times daily.   Yes [provider]  tamsulosin (FLOMAX) 0.4 MG CAPS capsule Take 0.4 mg by mouth daily after breakfast.    Yes [provider]  traMADol (ULTRAM) 50 MG tablet Take 50 mg by mouth every 6 (six) hours as needed.  01/22/15  Yes [provider]  triamterene-hydrochlorothiazide (MAXZIDE) 75-50 MG tablet Take 1 tablet by mouth daily.   Yes [provider]  clindamycin (CLEOCIN) 150 MG capsule Take 3 capsules (450 mg total) by mouth 3 (three) times daily for 7 days. 09/10/18 09/17/18  Coral Spikes, DO  levofloxacin (LEVAQUIN) 500 MG tablet Take 1 tablet (500 mg total) by mouth daily for 7 days. 09/10/18 09/17/18  Coral Spikes, DO  senna-docusate (SENOKOT-S) 8.6-50 MG tablet Take 2 tablets by mouth 2 (two) times daily. 08/07/16   Carrie Mew, MD    Family History Family History  Problem Relation Age of Onset  . Heart attack Father   . Diabetes Father   . Heart attack Brother   . Prostate cancer Neg Hx   . Bladder Cancer Neg Hx   . Kidney cancer Neg Hx     Social History Social History   Tobacco Use  . Smoking status: Former Smoker    Types: Cigarettes  . Smokeless tobacco: Current User    Types: Chew  . Tobacco comment: quit  smoking cigerates 1995  Substance Use Topics  . Alcohol use: No    Alcohol/week: 0.0 standard drinks  . Drug use: No     Allergies   Gentamycin [gentamicin], Iodinated diagnostic agents, Meloxicam, Metrizamide, Penicillin v potassium, Celecoxib, Ciprofloxacin, Hydrocodone, and Propranolol   Review of Systems Review of Systems  Constitutional: Negative for fever.  Skin: Positive for wound.   Physical Exam Triage Vital Signs ED Triage Vitals  Enc Vitals Group     BP 09/10/18 0825 (!) 127/52     Pulse Rate 09/10/18 0825 62     Resp 09/10/18 0825 16     Temp 09/10/18 0825 98.2 F (36.8 C)     Temp Source 09/10/18 0825 Oral     SpO2 09/10/18 0825 100 %     Weight 09/10/18 0828 175 lb (79.4 kg)     Height 09/10/18 0828 5\' 8"  (1.727 m)     Head Circumference --      Peak Flow --      Pain Score 09/10/18 0827 7     Pain Loc --      Pain Edu? --      Excl. in [place]? --    Updated Vital Signs BP (!) 127/52 (BP Location: Left Arm)   Pulse 62   Temp 98.2 F (36.8 C) (Oral)   Resp 16   Ht 5\' 8"  (1.727 m)   Wt 79.4 kg   SpO2 100%   BMI 26.61 kg/m   Visual Acuity Right Eye Distance:   Left Eye Distance:   Bilateral Distance:    Right Eye Near:   Left Eye Near:    Bilateral Near:     Physical Exam Constitutional:       General: He is not in acute distress.    Appearance: Normal appearance.  HENT:     Head: Normocephalic and atraumatic.  Eyes:     General: No scleral icterus. Cardiovascular:     Rate and Rhythm: Normal rate and regular rhythm.  Pulmonary:     Effort: Pulmonary effort is normal. No respiratory distress.  Abdominal:     General: There is no distension.     Palpations: Abdomen is soft.     Tenderness: There is no abdominal tenderness.  Musculoskeletal:       Feet:  Feet:     Comments: Ulceration noted to the right second digit at the labeled location.. Area was probed and does not go down to the bone.  Surrounding erythema which affects the dorsum of his foot and is extending proximally.  2 + dorsalis pedis and posterior tibial pulses. Neurological:     Mental Status: He is alert.  Psychiatric:        Mood and Affect: Mood normal.        Behavior: Behavior normal.    UC Treatments / Results  Labs (all labs ordered are listed, but only abnormal results are displayed) Labs Reviewed  CBC WITH DIFFERENTIAL/PLATELET - Abnormal; Notable for the following components:      Result Value   WBC 12.4 (*)    Hemoglobin 11.2 (*)    HCT 35.2 (*)    RDW 18.7 (*)    Neutro Abs 9.7 (*)    All other components within normal limits  COMPREHENSIVE METABOLIC PANEL - Abnormal; Notable for the following components:   Glucose, Bld 176 (*)    BUN 28 (*)    Creatinine, Ser 1.27 (*)    Total Protein 6.2 (*)  Albumin 3.0 (*)    GFR calc non Af Amer 53 (*)    All other components within normal limits    EKG None  Radiology Dg Toe 2nd Right  Result Date: 09/10/2018 CLINICAL DATA:  Scratches toe with at toenail of the other foot, bleeding wound at second toe EXAM: RIGHT SECOND TOE COMPARISON:  None FINDINGS: Osseous demineralization. Slight joint space narrowing, spur formation, and sclerosis at DIP joint second toe. Joint spaces otherwise preserved. No acute fracture, dislocation, or  bone destruction. Small vessel vascular calcifications. IMPRESSION: Degenerative changes at DIP joint second toe. No acute osseous abnormalities. Electronically Signed   By: Lavonia Dana M.D.   On: 09/10/2018 09:45    Procedures Procedures (including critical care time)  Medications Ordered in UC Medications - No data to display  Initial Impression / Assessment and Plan / UC Course  I have reviewed the triage vital signs and the nursing notes.  Pertinent labs & imaging results that were available during my care of the patient were reviewed by me and considered in my medical decision making (see chart for details).    78 year old male presents with a diabetic ulceration with surrounding cellulitis.  CBC with mild white blood cell count elevation at 12.4.  Remainder of labs stable.  X-ray negative for gas.  Podiatry appointment moved up to Tuesday.  Placing on clindamycin and Levaquin.  Dosed appropriately for creatinine clearance of 52.  Advised patient and son that if he fails to improve or worsens in the next 48 hours he needs to go directly to the ER.  I am concerned given his comorbidities and his presentation.  Final Clinical Impressions(s) / UC Diagnoses   Final diagnoses:  Diabetic ulcer of toe of right foot associated with type 2 diabetes mellitus, unspecified ulcer stage ([place])  Cellulitis of right lower extremity     Discharge Instructions     See [facility] on Tues @ 915.  Keep wound dressed.  Antibiotic as prescribed.  If you worsen, go to the ER.  Take care  Dr. Lacinda Axon     ED Prescriptions    Medication Sig Dispense Auth. Provider   doxycycline (VIBRAMYCIN) 100 MG capsule  (Status: Discontinued) Take 1 capsule (100 mg total) by mouth 2 (two) times daily. 20 capsule Ercell Perlman G, DO   clindamycin (CLEOCIN) 150 MG capsule Take 3 capsules (450 mg total) by mouth 3 (three) times daily for 7 days. 63 capsule Gaylin Bulthuis G, DO   levofloxacin (LEVAQUIN)  500 MG tablet Take 1 tablet (500 mg total) by mouth daily for 7 days. 7 tablet Coral Spikes, DO     Controlled Substance Prescriptions [place] Controlled Substance Registry consulted? Not Applicable   Coral Spikes, DO 09/10/18 1018

## 2018-09-10 NOTE — ED Triage Notes (Signed)
Patient c/o right toe infection x 1 week ago.

## 2018-10-04 ENCOUNTER — Other Ambulatory Visit: Payer: Self-pay

## 2018-10-04 ENCOUNTER — Encounter
Admission: RE | Admit: 2018-10-04 | Discharge: 2018-10-04 | Disposition: A | Discharge status: Home / Self Care | Payer: Medicare Other | Source: Ambulatory Visit | Attending: Podiatry | Admitting: Podiatry

## 2018-10-04 ENCOUNTER — Other Ambulatory Visit: Payer: Self-pay | Admitting: Podiatry

## 2018-10-04 DIAGNOSIS — Z1159 Encounter for screening for other viral diseases: Secondary | ICD-10-CM | POA: Diagnosis present

## 2018-10-04 HISTORY — DX: Unspecified asthma, uncomplicated: J45.909

## 2018-10-04 HISTORY — DX: Parkinson's disease without dyskinesia, without mention of fluctuations: G20.A1

## 2018-10-04 HISTORY — DX: Cardiac arrhythmia, unspecified: I49.9

## 2018-10-04 HISTORY — DX: Repeated falls: R29.6

## 2018-10-04 HISTORY — DX: Parkinson's disease: G20

## 2018-10-04 HISTORY — DX: Personal history of urinary calculi: Z87.442

## 2018-10-04 NOTE — Patient Instructions (Signed)
Your procedure is scheduled on: 10/08/18 Report to Day Surgery.[facility]OND FLOOR To find out your arrival time please call [phone] between 1PM - 3PM on  10/07/18.  Remember: Instructions that are not followed completely may result in serious medical risk,  up to and including death, or upon the discretion of your surgeon and anesthesiologist your  surgery may need to be rescheduled.     _X__ 1. Do not eat food after midnight the night before your procedure.                 No gum chewing or hard candies. You may drink clear liquids up to 2 hours                 before you are scheduled to arrive for your surgery- DO not drink clear                 liquids within 2 hours of the start of your surgery.                 Clear Liquids include:  water, apple juice without pulp, clear carbohydrate                 drink such as Clearfast of Gatorade, Black Coffee or Tea (Do not add                 anything to coffee or tea).  __X__2.  On the morning of surgery brush your teeth with toothpaste and water, you                may rinse your mouth with mouthwash if you wish.  Do not swallow any toothpaste of mouthwash.     _X__ 3.  No Alcohol for 24 hours before or after surgery.   _X__ 4.  Do Not Smoke or use e-cigarettes For 24 Hours Prior to Your Surgery.                 Do not use any chewable tobacco products for at least 6 hours prior to                 surgery.  ____  5.  Bring all medications with you on the day of surgery if instructed.   _X___  6.  Notify your doctor if there is any change in your medical condition      (cold, fever, infections).     Do not wear jewelry, make-up, hairpins, clips or nail polish. Do not wear lotions, powders, or perfumes. You may wear deodorant. Do not shave 48 hours prior to surgery. Men may shave face and neck. Do not bring valuables to the hospital.    [facility] is not responsible for any belongings or  valuables.  Contacts, dentures or bridgework may not be worn into surgery. Leave your suitcase in the car. After surgery it may be brought to your room. For patients admitted to the hospital, discharge time is determined by your treatment team.   Patients discharged the day of surgery will not be allowed to drive home.   Please read over the following fact sheets that you were given:   Surgical Site Infection Prevention          ____X Take these medicines the morning of surgery with A SIP OF WATER:    1. LORAZEPAM  2. ATENOLOL  3. CARBIDOPA LEVADOPA  4.GABAPENTIN  5.HYDROXYCHLOROQUINE  6.TAMSULOSIN  7.PREDNISONE ____ Fleet Enema (as directed)   _X___ Use CHG Soap as directed  ____ Use inhalers on the day of surgery  ____ Stop metformin 2 days prior to surgery    ____ Take 1/2 of usual insulin dose the night before surgery. No insulin the morning          of surgery.   __X__ Stop Coumadin/Plavix/aspirin on   STOP ELIQUIS AND ASPIRIN AS INSTRUCTED BY DR CLINE ____ Stop Anti-inflammatories on    ____ Stop supplements until after surgery.    ____ Bring C-Pap to the hospital.

## 2018-10-04 NOTE — Pre-Procedure Instructions (Signed)
PATIENT WITH VERY SWOKLEEN LEFT ARM AND HAND. WIFE TAKING TO ACUTE CARE CLINIC IN [facility] AFTER COVID TEST DONE. NOTIFIED DR CLINES AND REQUESTED PREOP ORDERS, CLEARANCE FROM CARDIOLOGIST, AND TO INSTRUCT IN WHEN TO STOP ELIQUIS AND OR ASPIRIN. SPOKE WITH JAMIE

## 2018-10-05 LAB — SARS CORONAVIRUS 2 (TAT 6-24 HRS): SARS Coronavirus 2: NEGATIVE

## 2018-10-05 NOTE — Pre-Procedure Instructions (Signed)
Cardiac clearance, note from PCP VISIT RE SWOLLEN LEFT HAND ON CHART

## 2018-10-06 ENCOUNTER — Other Ambulatory Visit: Payer: Medicare Other

## 2018-10-07 MED ORDER — CLINDAMYCIN PHOSPHATE 900 MG/50ML IV SOLN
900.0000 mg | INTRAVENOUS | Status: AC
  Administered 2018-10-08: 900 mg via INTRAVENOUS

## 2018-10-08 ENCOUNTER — Ambulatory Visit
Admission: RE | Admit: 2018-10-08 | Discharge: 2018-10-08 | Disposition: A | Discharge status: Home / Self Care | Payer: Medicare Other | Attending: Podiatry | Admitting: Podiatry

## 2018-10-08 ENCOUNTER — Ambulatory Visit: Payer: Medicare Other | Admitting: Certified Registered"

## 2018-10-08 ENCOUNTER — Encounter
Admission: RE | Disposition: A | Discharge status: Home / Self Care | Payer: Self-pay | Source: Home / Self Care | Attending: Podiatry

## 2018-10-08 ENCOUNTER — Encounter: Payer: Self-pay | Admitting: *Deleted

## 2018-10-08 ENCOUNTER — Other Ambulatory Visit: Payer: Self-pay

## 2018-10-08 DIAGNOSIS — M1990 Unspecified osteoarthritis, unspecified site: Secondary | ICD-10-CM | POA: Insufficient documentation

## 2018-10-08 DIAGNOSIS — Z885 Allergy status to narcotic agent status: Secondary | ICD-10-CM | POA: Diagnosis not present

## 2018-10-08 DIAGNOSIS — N289 Disorder of kidney and ureter, unspecified: Secondary | ICD-10-CM | POA: Diagnosis not present

## 2018-10-08 DIAGNOSIS — Z880 Allergy status to penicillin: Secondary | ICD-10-CM | POA: Diagnosis not present

## 2018-10-08 DIAGNOSIS — E11621 Type 2 diabetes mellitus with foot ulcer: Secondary | ICD-10-CM | POA: Insufficient documentation

## 2018-10-08 DIAGNOSIS — L97513 Non-pressure chronic ulcer of other part of right foot with necrosis of muscle: Secondary | ICD-10-CM | POA: Insufficient documentation

## 2018-10-08 DIAGNOSIS — I10 Essential (primary) hypertension: Secondary | ICD-10-CM | POA: Diagnosis not present

## 2018-10-08 DIAGNOSIS — Z881 Allergy status to other antibiotic agents status: Secondary | ICD-10-CM | POA: Insufficient documentation

## 2018-10-08 DIAGNOSIS — I4891 Unspecified atrial fibrillation: Secondary | ICD-10-CM | POA: Insufficient documentation

## 2018-10-08 DIAGNOSIS — M2041 Other hammer toe(s) (acquired), right foot: Secondary | ICD-10-CM | POA: Diagnosis not present

## 2018-10-08 DIAGNOSIS — Z888 Allergy status to other drugs, medicaments and biological substances status: Secondary | ICD-10-CM | POA: Diagnosis not present

## 2018-10-08 DIAGNOSIS — J45909 Unspecified asthma, uncomplicated: Secondary | ICD-10-CM | POA: Insufficient documentation

## 2018-10-08 DIAGNOSIS — Z951 Presence of aortocoronary bypass graft: Secondary | ICD-10-CM | POA: Insufficient documentation

## 2018-10-08 DIAGNOSIS — F419 Anxiety disorder, unspecified: Secondary | ICD-10-CM | POA: Insufficient documentation

## 2018-10-08 DIAGNOSIS — I2510 Atherosclerotic heart disease of native coronary artery without angina pectoris: Secondary | ICD-10-CM | POA: Diagnosis not present

## 2018-10-08 DIAGNOSIS — E1169 Type 2 diabetes mellitus with other specified complication: Secondary | ICD-10-CM | POA: Insufficient documentation

## 2018-10-08 DIAGNOSIS — L97519 Non-pressure chronic ulcer of other part of right foot with unspecified severity: Secondary | ICD-10-CM | POA: Diagnosis present

## 2018-10-08 DIAGNOSIS — E1151 Type 2 diabetes mellitus with diabetic peripheral angiopathy without gangrene: Secondary | ICD-10-CM | POA: Diagnosis not present

## 2018-10-08 DIAGNOSIS — Z96653 Presence of artificial knee joint, bilateral: Secondary | ICD-10-CM | POA: Insufficient documentation

## 2018-10-08 DIAGNOSIS — M869 Osteomyelitis, unspecified: Secondary | ICD-10-CM | POA: Insufficient documentation

## 2018-10-08 DIAGNOSIS — Z96641 Presence of right artificial hip joint: Secondary | ICD-10-CM | POA: Diagnosis not present

## 2018-10-08 DIAGNOSIS — Z886 Allergy status to analgesic agent status: Secondary | ICD-10-CM | POA: Diagnosis not present

## 2018-10-08 DIAGNOSIS — I252 Old myocardial infarction: Secondary | ICD-10-CM | POA: Diagnosis not present

## 2018-10-08 DIAGNOSIS — Z87891 Personal history of nicotine dependence: Secondary | ICD-10-CM | POA: Insufficient documentation

## 2018-10-08 HISTORY — PX: AMPUTATION TOE: SHX6595

## 2018-10-08 LAB — GLUCOSE, CAPILLARY
Glucose-Capillary: 130 mg/dL — ABNORMAL HIGH (ref 70–99)
Glucose-Capillary: 102 mg/dL — ABNORMAL HIGH (ref 70–99)

## 2018-10-08 SURGERY — AMPUTATION, TOE
Anesthesia: General | Laterality: Right

## 2018-10-08 MED ORDER — FAMOTIDINE 20 MG PO TABS
20.0000 mg | ORAL_TABLET | Freq: Once | ORAL | Status: AC
  Administered 2018-10-08: 20 mg via ORAL

## 2018-10-08 MED ORDER — BUPIVACAINE HCL (PF) 0.5 % IJ SOLN
INTRAMUSCULAR | Status: AC
  Filled 2018-10-08: qty 30

## 2018-10-08 MED ORDER — PHENYLEPHRINE HCL (PRESSORS) 10 MG/ML IV SOLN
INTRAVENOUS | Status: DC | PRN
  Administered 2018-10-08: 100 ug via INTRAVENOUS

## 2018-10-08 MED ORDER — CHLORHEXIDINE GLUCONATE 4 % EX LIQD: 60.0000 mL | Freq: Once | CUTANEOUS | Status: DC

## 2018-10-08 MED ORDER — PROPOFOL 10 MG/ML IV BOLUS
INTRAVENOUS | Status: AC
  Filled 2018-10-08: qty 40

## 2018-10-08 MED ORDER — OXYCODONE HCL 5 MG PO TABS: 5.0000 mg | ORAL_TABLET | ORAL | 0 refills | Status: DC | PRN

## 2018-10-08 MED ORDER — FENTANYL CITRATE (PF) 100 MCG/2ML IJ SOLN: 25.0000 ug | INTRAMUSCULAR | Status: DC | PRN

## 2018-10-08 MED ORDER — EPHEDRINE SULFATE 50 MG/ML IJ SOLN
INTRAMUSCULAR | Status: DC | PRN
  Administered 2018-10-08 (×3): 10 mg via INTRAVENOUS

## 2018-10-08 MED ORDER — SODIUM CHLORIDE 0.9 % IV SOLN
INTRAVENOUS | Status: DC
  Administered 2018-10-08 (×2): via INTRAVENOUS

## 2018-10-08 MED ORDER — FAMOTIDINE 20 MG PO TABS
ORAL_TABLET | ORAL | Status: AC
  Administered 2018-10-08: 20 mg via ORAL
  Filled 2018-10-08: qty 1

## 2018-10-08 MED ORDER — PROPOFOL 500 MG/50ML IV EMUL
INTRAVENOUS | Status: DC | PRN
  Administered 2018-10-08: 50 ug/kg/min via INTRAVENOUS

## 2018-10-08 MED ORDER — LIDOCAINE HCL (CARDIAC) PF 100 MG/5ML IV SOSY
PREFILLED_SYRINGE | INTRAVENOUS | Status: DC | PRN
  Administered 2018-10-08: 100 mg via INTRAVENOUS

## 2018-10-08 MED ORDER — ONDANSETRON HCL 4 MG/2ML IJ SOLN
INTRAMUSCULAR | Status: DC | PRN
  Administered 2018-10-08: 4 mg via INTRAVENOUS

## 2018-10-08 MED ORDER — BUPIVACAINE HCL 0.5 % IJ SOLN
INTRAMUSCULAR | Status: DC | PRN
  Administered 2018-10-08: 10 mL

## 2018-10-08 MED ORDER — CLINDAMYCIN PHOSPHATE 900 MG/50ML IV SOLN
INTRAVENOUS | Status: AC
  Filled 2018-10-08: qty 50

## 2018-10-08 MED ORDER — ONDANSETRON HCL 4 MG/2ML IJ SOLN: 4.0000 mg | Freq: Once | INTRAMUSCULAR | Status: DC | PRN

## 2018-10-08 MED ORDER — NEOMYCIN-POLYMYXIN B GU 40-200000 IR SOLN
Status: AC
  Filled 2018-10-08: qty 1

## 2018-10-08 SURGICAL SUPPLY — 47 items
BLADE MED AGGRESSIVE (BLADE) ×1 IMPLANT
BLADE OSC/SAGITTAL MD 5.5X18 (BLADE) ×1 IMPLANT
BLADE SURG 15 STRL LF DISP TIS (BLADE) ×2 IMPLANT
BLADE SURG 15 STRL SS (BLADE) ×4
BLADE SURG MINI STRL (BLADE) ×3 IMPLANT
BNDG CONFORM 2 STRL LF (GAUZE/BANDAGES/DRESSINGS) ×3 IMPLANT
BNDG ELASTIC 4X5.8 VLCR STR LF (GAUZE/BANDAGES/DRESSINGS) ×3 IMPLANT
BNDG ESMARK 4X12 TAN STRL LF (GAUZE/BANDAGES/DRESSINGS) ×3 IMPLANT
BNDG GAUZE 4.5X4.1 6PLY STRL (MISCELLANEOUS) ×3 IMPLANT
CANISTER SUCT 1200ML W/VALVE (MISCELLANEOUS) ×3 IMPLANT
CLOSURE WOUND 1/4X4 (GAUZE/BANDAGES/DRESSINGS) ×1
COVER WAND RF STERILE (DRAPES) ×3 IMPLANT
CUFF TOURN 18 STER (MISCELLANEOUS) ×1 IMPLANT
CUFF TOURN DUAL PL 12 NO SLV (MISCELLANEOUS) ×3 IMPLANT
DRAPE FLUOR MINI C-ARM 54X84 (DRAPES) ×3 IMPLANT
DURAPREP 26ML APPLICATOR (WOUND CARE) ×3 IMPLANT
ELECT REM PT RETURN 9FT ADLT (ELECTROSURGICAL) ×3
ELECTRODE REM PT RTRN 9FT ADLT (ELECTROSURGICAL) ×1 IMPLANT
GAUZE SPONGE 4X4 12PLY STRL (GAUZE/BANDAGES/DRESSINGS) ×3 IMPLANT
GAUZE XEROFORM 1X8 LF (GAUZE/BANDAGES/DRESSINGS) ×3 IMPLANT
GLOVE BIO SURGEON STRL SZ7.5 (GLOVE) ×3 IMPLANT
GLOVE INDICATOR 8.0 STRL GRN (GLOVE) ×3 IMPLANT
GOWN STRL REUS W/ TWL LRG LVL3 (GOWN DISPOSABLE) ×2 IMPLANT
GOWN STRL REUS W/TWL LRG LVL3 (GOWN DISPOSABLE) ×4
HANDPIECE VERSAJET DEBRIDEMENT (MISCELLANEOUS) ×1 IMPLANT
KIT TURNOVER KIT A (KITS) ×3 IMPLANT
LABEL OR SOLS (LABEL) ×3 IMPLANT
NDL FILTER BLUNT 18X1 1/2 (NEEDLE) ×1 IMPLANT
NDL HYPO 25X1 1.5 SAFETY (NEEDLE) ×2 IMPLANT
NEEDLE FILTER BLUNT 18X 1/2SAF (NEEDLE)
NEEDLE FILTER BLUNT 18X1 1/2 (NEEDLE) IMPLANT
NEEDLE HYPO 25X1 1.5 SAFETY (NEEDLE) ×6 IMPLANT
NS IRRIG 500ML POUR BTL (IV SOLUTION) ×3 IMPLANT
PACK EXTREMITY ARMC (MISCELLANEOUS) ×3 IMPLANT
SOL .9 NS 3000ML IRR  AL (IV SOLUTION)
SOL .9 NS 3000ML IRR UROMATIC (IV SOLUTION) ×1 IMPLANT
SOL PREP PVP 2OZ (MISCELLANEOUS) ×3
SOLUTION PREP PVP 2OZ (MISCELLANEOUS) ×1 IMPLANT
STOCKINETTE STRL 6IN 960660 (GAUZE/BANDAGES/DRESSINGS) ×3 IMPLANT
STRIP CLOSURE SKIN 1/4X4 (GAUZE/BANDAGES/DRESSINGS) ×2 IMPLANT
SUT ETHILON 3-0 FS-10 30 BLK (SUTURE) ×3
SUT ETHILON 4-0 (SUTURE) ×4
SUT ETHILON 4-0 FS2 18XMFL BLK (SUTURE) ×2
SUTURE EHLN 3-0 FS-10 30 BLK (SUTURE) ×1 IMPLANT
SUTURE ETHLN 4-0 FS2 18XMF BLK (SUTURE) IMPLANT
SWAB DUAL CULTURE TRANS RED ST (MISCELLANEOUS) ×1 IMPLANT
SYR 10ML LL (SYRINGE) ×3 IMPLANT

## 2018-10-08 NOTE — Anesthesia Preprocedure Evaluation (Signed)
Anesthesia Evaluation  Patient identified by MRN, date of birth, ID band Patient awake    Reviewed: Allergy & Precautions, H&P , NPO status , Patient's Chart, lab work & pertinent test results, reviewed documented beta blocker date and time   Airway Mallampati: III   Neck ROM: full    Dental  (+) Upper Dentures, Lower Dentures   Pulmonary asthma , former smoker,    Pulmonary exam normal        Cardiovascular hypertension, + CAD, + Past MI and + Peripheral Vascular Disease  Normal cardiovascular exam+ dysrhythmias Atrial Fibrillation  Rate:Normal     Neuro/Psych PSYCHIATRIC DISORDERS Anxiety negative neurological ROS     GI/Hepatic Neg liver ROS, PUD,   Endo/Other  negative endocrine ROSdiabetesHypothyroidism   Renal/GU Renal InsufficiencyRenal disease  negative genitourinary   Musculoskeletal  (+) Arthritis , Osteoarthritis,    Abdominal   Peds  Hematology negative hematology ROS (+) anemia ,   Anesthesia Other Findings Past Medical History:   Diabetes mellitus without complication (HCC)                 Hypertension                                                 Myocardial infarction ([place])                                  Hypothyroidism                                               Cancer ([place])                                               Past Surgical History:   CORONARY ARTERY BYPASS GRAFT                                  CARDIAC CATHETERIZATION                                       CHOLECYSTECTOMY                                               REPLACEMENT TOTAL KNEE BILATERAL                              TOTAL HIP ARTHROPLASTY                          Right              EYE SURGERY  JOINT REPLACEMENT                                           BMI    Body Mass Index   28.39 kg/m 2     Reproductive/Obstetrics                              Anesthesia Physical  Anesthesia Plan  ASA: IV  Anesthesia Plan: General   Post-op Pain Management:    Induction:   PONV Risk Score and Plan: Propofol infusion and TIVA  Airway Management Planned: Nasal Cannula  Additional Equipment:   Intra-op Plan:   Post-operative Plan:   Informed Consent: I have reviewed the patients History and Physical, chart, labs and discussed the procedure including the risks, benefits and alternatives for the proposed anesthesia with the patient or authorized representative who has indicated his/her understanding and acceptance.     Dental Advisory Given  Plan Discussed with: CRNA  Anesthesia Plan Comments:         Anesthesia Quick Evaluation

## 2018-10-08 NOTE — H&P (Signed)
Subjective: Patient presents for amputation of his right second toe.  History of chronic ulceration on the second toe.  Objective: Pulses are diminished but palpable.  Sensation grossly intact.  A full-thickness ulceration down to the level of exposed tendon noted on the plantar aspect of the right second toe.  Some hammertoe contracture noted.  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  Plan: Patient presents today for amputation of his right second toe.  Medical history and physical was reviewed and the patient is stable for surgery.

## 2018-10-08 NOTE — Anesthesia Post-op Follow-up Note (Signed)
Anesthesia QCDR form completed.        

## 2018-10-08 NOTE — Discharge Instructions (Addendum)
1.  Elevate the right lower extremity on pillows.  2.  Keep the bandage on the right foot clean, dry, and do not remove.  3.  Sponge bathe only right lower extremity.  4.  Wear surgical shoe on the right foot whenever walking or standing.  5.  Take 1 pain pill, oxycodone, every 4 hours only if needed for pain.  AMBULATORY SURGERY  DISCHARGE INSTRUCTIONS   1) The drugs that you were given will stay in your system until tomorrow so for the next 24 hours you should not:  A) Drive an automobile B) Make any legal decisions C) Drink any alcoholic beverage   2) You may resume regular meals tomorrow.  Today it is better to start with liquids and gradually work up to solid foods.  You may eat anything you prefer, but it is better to start with liquids, then soup and crackers, and gradually work up to solid foods.   3) Please notify your doctor immediately if you have any unusual bleeding, trouble breathing, redness and pain at the surgery site, drainage, fever, or pain not relieved by medication.    4) Additional Instructions:        Please contact your physician with any problems or Same Day Surgery at [phone], Monday through Friday 6 am to 4 pm, or [place] at [facility] number at [phone].

## 2018-10-08 NOTE — Transfer of Care (Signed)
Immediate Anesthesia Transfer of Care Note  Patient: Sean Armstrong  Procedure(s) Performed: AMPUTATION TOE MPJ (Right )  Patient Location: PACU  Anesthesia Type:MAC  Level of Consciousness: awake  Airway & Oxygen Therapy: Patient Spontanous Breathing  Post-op Assessment: Report given to RN  Post vital signs: Reviewed  Last Vitals:  Vitals Value Taken Time  BP 123/66 10/08/18 0822  Temp    Pulse 87 10/08/18 0825  Resp 16 10/08/18 0825  SpO2 95 % 10/08/18 0825  Vitals shown include unvalidated device data.  Last Pain:  Vitals:   10/08/18 0618  TempSrc: Tympanic  PainSc: 6          Complications: No apparent anesthesia complications

## 2018-10-08 NOTE — Op Note (Signed)
Date of operation: 10/08/2018.  Surgeon: Durward Fortes D.P.M.  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  Postoperative diagnosis: Same.  Procedure: Amputation right second toe metatarsal phalangeal joint.  Anesthesia: Local MAC.  Hemostasis: Pneumatic tourniquet right ankle 250 mmHg.  Estimated blood loss: 10 cc.  Pathology: Right second toe.  Complications: None apparent.  Operative indications: This is an 78 year old male with a chronic ulceration on his right second toe down to the level of exposed tendon.  Decision was made for amputation of the second toe.  Operative procedure: Patient was taken to the operating room and placed on the table in the supine position.  Following satisfactory sedation the right foot was anesthetized with 10 cc of 0.5% Marcaine plain around the second metatarsal.  A pneumatic tourniquet was applied at the level of the right ankle and the foot was prepped and draped in the usual sterile fashion.  The foot was exsanguinated and the tourniquet inflated to 250 mmHg.      Attention was directed to the distal right foot where an elliptical incision was made dorsal to plantar around the base of the second toe.  Incision was carried sharply down to the bone and dissection carried back to the metatarsal phalangeal joint where the toe was disarticulated and removed in toto.  There was noted to be some significant bleeding through the tourniquet so it was released and the bleeding did subside some.  The wound was then flushed with copious amounts of sterile saline and closed using 4-0 nylon vertical mattress and simple interrupted sutures.  Xeroform 4 x 4's and con form applied to the right foot followed by Kerlix and an Ace wrap.  Patient tolerated the procedure and anesthesia well and was transported to the PACU with vital signs stable and in good condition.

## 2018-10-08 NOTE — Interval H&P Note (Signed)
History and Physical Interval Note:  10/08/2018 7:13 AM  Sean Armstrong  has presented today for surgery, with the diagnosis of Ulcer rt foot with necrosis/L97.513.  The various methods of treatment have been discussed with the patient and family. After consideration of risks, benefits and other options for treatment, the patient has consented to  Procedure(s): AMPUTATION TOE MPJ (Right) as a surgical intervention.  The patient's history has been reviewed, patient examined, no change in status, stable for surgery.  I have reviewed the patient's chart and labs.  Questions were answered to the patient's satisfaction.     Durward Fortes

## 2018-10-10 ENCOUNTER — Encounter: Payer: Self-pay | Admitting: Podiatry

## 2018-10-11 NOTE — Anesthesia Postprocedure Evaluation (Signed)
Anesthesia Post Note  Patient: Sean Armstrong  Procedure(s) Performed: AMPUTATION TOE MPJ (Right )  Patient location during evaluation: PACU Anesthesia Type: General Level of consciousness: awake and alert and oriented Pain management: pain level controlled Vital Signs Assessment: post-procedure vital signs reviewed and stable Respiratory status: spontaneous breathing Cardiovascular status: blood pressure returned to baseline Anesthetic complications: no     Last Vitals:  Vitals:   10/08/18 0908 10/08/18 0920  BP: (!) 107/39 118/78  Pulse: 73 65  Resp: 14 14  Temp: (!) 36.4 C   SpO2: 100% 100%    Last Pain:  Vitals:   10/08/18 0920  TempSrc:   PainSc: 0-No pain                 Yaphet Smethurst

## 2018-10-12 LAB — SURGICAL PATHOLOGY

## 2018-11-19 ENCOUNTER — Telehealth: Payer: Self-pay | Admitting: Urology

## 2018-11-19 NOTE — Telephone Encounter (Signed)
Patient notified he will need to schedule an appointment to be seen before scheduling a procedure for the stricture

## 2018-11-19 NOTE — Telephone Encounter (Signed)
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 She states that he is still able to urinate but barely. Please advise on when to schedule.

## 2018-11-19 NOTE — Telephone Encounter (Signed)
Appt made on 01/12/2019, pt could not come until after 3pm due to transportation issues. There was nothing else open.

## 2018-12-16 ENCOUNTER — Emergency Department: Payer: Medicare Other

## 2018-12-16 ENCOUNTER — Emergency Department
Admission: EM | Admit: 2018-12-16 | Discharge: 2018-12-16 | Disposition: A | Discharge status: Home / Self Care | Payer: Medicare Other | Attending: Emergency Medicine | Admitting: Emergency Medicine

## 2018-12-16 ENCOUNTER — Other Ambulatory Visit: Payer: Self-pay

## 2018-12-16 DIAGNOSIS — W0110XA Fall on same level from slipping, tripping and stumbling with subsequent striking against unspecified object, initial encounter: Secondary | ICD-10-CM | POA: Insufficient documentation

## 2018-12-16 DIAGNOSIS — I10 Essential (primary) hypertension: Secondary | ICD-10-CM | POA: Insufficient documentation

## 2018-12-16 DIAGNOSIS — Z79899 Other long term (current) drug therapy: Secondary | ICD-10-CM | POA: Insufficient documentation

## 2018-12-16 DIAGNOSIS — W19XXXA Unspecified fall, initial encounter: Secondary | ICD-10-CM

## 2018-12-16 DIAGNOSIS — E039 Hypothyroidism, unspecified: Secondary | ICD-10-CM | POA: Insufficient documentation

## 2018-12-16 DIAGNOSIS — Y9389 Activity, other specified: Secondary | ICD-10-CM | POA: Insufficient documentation

## 2018-12-16 DIAGNOSIS — J45909 Unspecified asthma, uncomplicated: Secondary | ICD-10-CM | POA: Diagnosis not present

## 2018-12-16 DIAGNOSIS — F17220 Nicotine dependence, chewing tobacco, uncomplicated: Secondary | ICD-10-CM | POA: Diagnosis not present

## 2018-12-16 DIAGNOSIS — Z89421 Acquired absence of other right toe(s): Secondary | ICD-10-CM | POA: Insufficient documentation

## 2018-12-16 DIAGNOSIS — Y999 Unspecified external cause status: Secondary | ICD-10-CM | POA: Insufficient documentation

## 2018-12-16 DIAGNOSIS — Z7901 Long term (current) use of anticoagulants: Secondary | ICD-10-CM | POA: Diagnosis not present

## 2018-12-16 DIAGNOSIS — G20 Parkinson's disease: Secondary | ICD-10-CM | POA: Insufficient documentation

## 2018-12-16 DIAGNOSIS — S0101XA Laceration without foreign body of scalp, initial encounter: Secondary | ICD-10-CM

## 2018-12-16 DIAGNOSIS — Z96641 Presence of right artificial hip joint: Secondary | ICD-10-CM | POA: Diagnosis not present

## 2018-12-16 DIAGNOSIS — E119 Type 2 diabetes mellitus without complications: Secondary | ICD-10-CM | POA: Insufficient documentation

## 2018-12-16 DIAGNOSIS — Z951 Presence of aortocoronary bypass graft: Secondary | ICD-10-CM | POA: Insufficient documentation

## 2018-12-16 DIAGNOSIS — Z7984 Long term (current) use of oral hypoglycemic drugs: Secondary | ICD-10-CM | POA: Diagnosis not present

## 2018-12-16 DIAGNOSIS — I2510 Atherosclerotic heart disease of native coronary artery without angina pectoris: Secondary | ICD-10-CM | POA: Diagnosis not present

## 2018-12-16 DIAGNOSIS — S0990XA Unspecified injury of head, initial encounter: Secondary | ICD-10-CM | POA: Diagnosis present

## 2018-12-16 DIAGNOSIS — Z96653 Presence of artificial knee joint, bilateral: Secondary | ICD-10-CM | POA: Insufficient documentation

## 2018-12-16 DIAGNOSIS — Z955 Presence of coronary angioplasty implant and graft: Secondary | ICD-10-CM | POA: Diagnosis not present

## 2018-12-16 DIAGNOSIS — I4891 Unspecified atrial fibrillation: Secondary | ICD-10-CM | POA: Diagnosis not present

## 2018-12-16 DIAGNOSIS — Y92009 Unspecified place in unspecified non-institutional (private) residence as the place of occurrence of the external cause: Secondary | ICD-10-CM | POA: Insufficient documentation

## 2018-12-16 MED ORDER — LIDOCAINE-EPINEPHRINE 2 %-1:100000 IJ SOLN
20.0000 mL | Freq: Once | INTRAMUSCULAR | Status: AC
  Administered 2018-12-16: 20 mL
  Filled 2018-12-16: qty 1

## 2018-12-16 NOTE — ED Notes (Signed)
Dressed skin tears with xeroform gauze per MD

## 2018-12-16 NOTE — ED Provider Notes (Signed)
[facility] Emergency Department Provider Note   ____________________________________________   First MD Initiated Contact with Patient 12/16/18 2122     (approximate)  I have reviewed the triage vital signs and the nursing notes.   HISTORY  Chief Complaint Fall and Head Laceration    HPI Sean Armstrong is a 78 y.o. male with past medical history of A. fib on Eliquis, CAD, hypertension, diabetes who presents to the ED following fall.  Patient reports that he heard the phone ringing and stood up from his chair to go answer it when he lost his balance and fell backwards.  He states he hit his head and is unsure whether he lost consciousness.  His son found him with bleeding from the back of his head, but states he has been at his baseline mental status.  Patient complains of some headache, but otherwise denies any complaints.  He has not had any chest pain, abdominal pain, hip pain, or extremity pain.  He denies any numbness or weakness.        Past Medical History:  Diagnosis Date  . Anemia   . Asthma   . Colon polyp   . Coronary artery disease   . Diabetes mellitus without complication ([place])   . Dysrhythmia   . Falls frequently   . FHx: SVT (supraventricular tachycardia) 1998  . GERD (gastroesophageal reflux disease)   . History of DVT (deep vein thrombosis) 2009  . History of kidney stones   . History of pulmonary embolism 08/2008  . Hyperlipidemia   . Hypertension   . Hypothyroidism   . Lumbar disc disease   . MGUS (monoclonal gammopathy of unknown significance)   . Myocardial infarction ([place])    1995  . Nephrolithiasis   . Osteoarthritis   . Parkinson's disease ([place])   . Peptic ulcer disease   . Renal insufficiency   . Rheumatoid arthritis [facility])     Patient Active Problem List   Diagnosis Date Noted  . Carotid stenosis 02/25/2018  . AAA (abdominal aortic aneurysm) without rupture ([place]) 09/29/2016  . PAD (peripheral artery disease)  ([place]) 09/29/2016  . Rib fracture 08/08/2016  . Constipation 08/08/2016  . Deep vein thrombosis (DVT) of popliteal vein of right lower extremity ([place]) 06/18/2016  . Left leg weakness 12/05/2015  . UTI (lower urinary tract infection) 12/05/2015  . Diabetes ([place]) 12/05/2015  . Anxiety 12/05/2015  . GERD (gastroesophageal reflux disease) 12/05/2015  . HLD (hyperlipidemia) 12/05/2015  . BPH (benign prostatic hyperplasia) 12/05/2015  . A-fib ([place]) 12/05/2015  . Sepsis ([place]) 06/18/2015    Past Surgical History:  Procedure Laterality Date  . ABLATION     av node entry  . AMPUTATION TOE Right 10/08/2018   Procedure: AMPUTATION TOE MPJ;  Surgeon: Sharlotte Alamo, DPM;  Location: [facility] ORS;  Service: Podiatry;  Laterality: Right;  . CARDIAC CATHETERIZATION    . CHOLECYSTECTOMY    . COLONOSCOPY  03/2007, 2006  . CORONARY ANGIOPLASTY     stents x 2  . CORONARY ARTERY BYPASS GRAFT    . ESOPHAGOGASTRODUODENOSCOPY (EGD) WITH PROPOFOL N/A 07/02/2015   Procedure: ESOPHAGOGASTRODUODENOSCOPY (EGD) WITH PROPOFOL;  Surgeon: Manya Silvas, MD;  Location: [facility] ENDOSCOPY;  Service: Endoscopy;  Laterality: N/A;  . EYE SURGERY    . JOINT REPLACEMENT    . NASAL/SINUS ENDOSCOPY    . REPLACEMENT TOTAL KNEE BILATERAL    . TOTAL HIP ARTHROPLASTY Right     Prior to Admission medications   Medication Sig  Start Date End Date Taking? Authorizing Provider  apixaban (ELIQUIS) 5 MG TABS tablet Take 1 tablet (5 mg total) by mouth 2 (two) times daily. 12/07/15   Hillary Bow, MD  atenolol (TENORMIN) 25 MG tablet Take 0.5 tablets (12.5 mg total) by mouth daily. Patient taking differently: Take 25 mg by mouth daily.  12/07/15   Hillary Bow, MD  atorvastatin (LIPITOR) 80 MG tablet Take 1 tablet (80 mg total) by mouth daily at 6 PM. 12/07/15   Hillary Bow, MD  carbidopa-levodopa (PARCOPA) 25-100 MG disintegrating tablet Take 1 tablet by mouth 3 (three) times daily.     [provider]  cephALEXin (KEFLEX) 500  MG capsule Take 500 mg by mouth 3 (three) times daily.    [provider]  cetirizine (ZYRTEC) 10 MG tablet Take 10 mg by mouth daily.    [provider]  cyanocobalamin (,VITAMIN B-12,) 1000 MCG/ML injection Inject 1,000 mcg into the muscle every 30 (thirty) days.    [provider]  cyclobenzaprine (FLEXERIL) 5 MG tablet Take 2.5 mg by mouth 3 (three) times daily as needed for muscle spasms.    [provider]  doxycycline (VIBRA-TABS) 100 MG tablet Take 100 mg by mouth 2 (two) times daily. X 7 DAYS    [provider]  fluticasone (FLONASE) 50 MCG/ACT nasal spray Place 2 sprays into both nostrils daily.    [provider]  gabapentin (NEURONTIN) 100 MG capsule Take 100 mg by mouth daily.     [provider]  glimepiride (AMARYL) 2 MG tablet Take 2 mg by mouth 2 (two) times daily.     [provider]  hydroxychloroquine (PLAQUENIL) 200 MG tablet Take 200 mg by mouth 2 (two) times daily.    [provider]  hydroxypropyl methylcellulose / hypromellose (ISOPTO TEARS / GONIOVISC) 2.5 % ophthalmic solution Place 1 drop into both eyes 3 (three) times daily as needed for dry eyes.    [provider]  Insulin Detemir (LEVEMIR FLEXTOUCH) 100 UNIT/ML Pen Inject 30 Units into the skin at bedtime. Patient not taking: Reported on 09/29/2018 08/10/16   Fritzi Mandes, MD  loperamide (IMODIUM) 2 MG capsule Take 1 capsule (2 mg total) by mouth every 4 (four) hours as needed for diarrhea or loose stools. Patient not taking: Reported on 09/29/2018 06/20/15   Hillary Bow, MD  LORazepam (ATIVAN) 0.5 MG tablet Take 0.5 mg by mouth 2 (two) times daily as needed for anxiety.     [provider]  Multiple Vitamins-Minerals (CENTRUM SILVER PO) Take 1 tablet by mouth daily.     [provider]  oxyCODONE (ROXICODONE) 5 MG immediate release tablet Take 1 tablet (5 mg total) by mouth every 4 (four) hours as needed. 10/08/18  10/08/19  Sharlotte Alamo, DPM  oxyCODONE (ROXICODONE) 5 MG immediate release tablet Take 1 tablet (5 mg total) by mouth every 4 (four) hours as needed. 10/08/18 10/08/19  Sharlotte Alamo, DPM  oxyCODONE-acetaminophen (PERCOCET) 5-325 MG tablet Take 1-2 tablets by mouth every 6 (six) hours as needed for moderate pain or severe pain. Patient taking differently: Take 1 tablet by mouth every 6 (six) hours as needed for moderate pain or severe pain.  08/08/16   Earleen Newport, MD  polyethylene glycol powder (GLYCOLAX/MIRALAX) powder 2 cap fulls in a full glass of water, three times a day, for 5 days. Patient not taking: Reported on 09/29/2018 08/07/16   Carrie Mew, MD  potassium chloride SA (K-DUR,KLOR-CON) 20 MEQ tablet Take  20 mEq by mouth daily.     [provider]  predniSONE (DELTASONE) 5 MG tablet Take 10 mg by mouth daily with breakfast.     [provider]  senna-docusate (SENOKOT-S) 8.6-50 MG tablet Take 2 tablets by mouth 2 (two) times daily. Patient not taking: Reported on 09/29/2018 08/07/16   Carrie Mew, MD  sodium chloride (OCEAN) 0.65 % SOLN nasal spray Place 1 spray into both nostrils as needed for congestion.    [provider]  sucralfate (CARAFATE) 1 g tablet Take 1 g by mouth 2 (two) times daily.    [provider]  tamsulosin (FLOMAX) 0.4 MG CAPS capsule Take 0.4 mg by mouth daily after breakfast.     [provider]  traZODone (DESYREL) 50 MG tablet Take 75 mg by mouth at bedtime.    [provider]  triamterene-hydrochlorothiazide (MAXZIDE) 75-50 MG tablet Take 1 tablet by mouth daily.    [provider]    Allergies Gentamycin [gentamicin], Iodinated diagnostic agents, Meloxicam, Metrizamide, Penicillin v potassium, Celecoxib, Ciprofloxacin, Hydrocodone, and Propranolol  Family History  Problem Relation Age of Onset  . Heart attack Father   . Diabetes Father   . Heart attack Brother   . Prostate cancer Neg  Hx   . Bladder Cancer Neg Hx   . Kidney cancer Neg Hx     Social History Social History   Tobacco Use  . Smoking status: Former Smoker    Types: Cigarettes  . Smokeless tobacco: Current User    Types: Chew  . Tobacco comment: quit smoking cigerates 1995  Substance Use Topics  . Alcohol use: No    Alcohol/week: 0.0 standard drinks  . Drug use: No    Review of Systems  Constitutional: No fever/chills Eyes: No visual changes. ENT: No sore throat. Cardiovascular: Denies chest pain. Respiratory: Denies shortness of breath. Gastrointestinal: No abdominal pain.  No nausea, no vomiting.  No diarrhea.  No constipation. Genitourinary: Negative for dysuria. Musculoskeletal: Negative for back pain.  Positive for fall. Skin: Negative for rash.  Positive for wound. Neurological: Negative for headaches, focal weakness or numbness.  ____________________________________________   PHYSICAL EXAM:  VITAL SIGNS: ED Triage Vitals  Enc Vitals Group     BP      Pulse      Resp      Temp      Temp src      SpO2      Weight      Height      Head Circumference      Peak Flow      Pain Score      Pain Loc      Pain Edu?      Excl. in [place]?     Constitutional: Alert and oriented. Eyes: Conjunctivae are normal. Head: 6 and a meter crescent laceration to right occipital scalp, no active bleeding. Nose: No congestion/rhinnorhea. Mouth/Throat: Mucous membranes are moist. Neck: Normal ROM Cardiovascular: Normal rate, irregularly irregular rhythm. Grossly normal heart sounds. Respiratory: Normal respiratory effort.  No retractions. Lungs CTAB. Gastrointestinal: Soft and nontender. No distention. Genitourinary: deferred Musculoskeletal: No lower extremity tenderness nor edema. Neurologic:  Normal speech and language. No gross focal neurologic deficits are appreciated. Skin:  Skin is warm, dry and intact. No rash noted. Psychiatric: Mood and affect are normal. Speech and behavior are  normal.  ____________________________________________   LABS (all labs ordered are listed, but only abnormal results are displayed)  Labs Reviewed - No  data to display ____________________________________________  EKG  ED ECG REPORT I, Blake Divine, the attending physician, personally viewed and interpreted this ECG.   Date: 12/16/2018  EKG Time: 21:45  Rate: 63  Rhythm: atrial fibrillation, rate 63  Axis: Normal  Intervals:none  ST&T Change: None    PROCEDURES  Procedure(s) performed (including Critical Care):  Marland KitchenMarland KitchenLaceration Repair  Date/Time: 12/16/2018 11:15 PM Performed by: Blake Divine, MD Authorized by: Blake Divine, MD   Consent:    Consent obtained:  Verbal   Consent given by:  Patient Anesthesia (see MAR for exact dosages):    Anesthesia method:  Local infiltration   Local anesthetic:  Lidocaine 2% WITH epi Laceration details:    Location:  Scalp   Scalp location:  Occipital   Length (cm):  5 Repair type:    Repair type:  Simple Pre-procedure details:    Preparation:  Patient was prepped and draped in usual sterile fashion and imaging obtained to evaluate for foreign bodies Exploration:    Wound exploration: wound explored through full range of motion     Contaminated: no   Treatment:    Area cleansed with:  Saline   Amount of cleaning:  Standard   Irrigation solution:  Sterile saline   Irrigation method:  Syringe   Visualized foreign bodies/material removed: no   Skin repair:    Repair method:  Staples   Number of staples:  11 Approximation:    Approximation:  Loose Post-procedure details:    Patient tolerance of procedure:  Tolerated well, no immediate complications     ____________________________________________   INITIAL IMPRESSION / ASSESSMENT AND PLAN / ED COURSE       78 year old male with history of A. fib on Eliquis presents to the ED following mechanical fall.  Patient states he fell backwards and hit his head after  he lost his balance trying to stand up.  He has a laceration to his occiput with no active bleeding.  CT head and C-spine are negative for acute process.  Wound was irrigated and closed with staples.  No other apparent traumatic injuries on exam.  Counseled patient to follow-up with PCP and return to the ED for new or worsening symptoms, patient agrees with plan.      ____________________________________________   FINAL CLINICAL IMPRESSION(S) / ED DIAGNOSES  Final diagnoses:  Fall, initial encounter  Laceration of scalp, initial encounter  Atrial fibrillation, unspecified type [facility])     ED Discharge Orders    None       Note:  This document was prepared using Dragon voice recognition software and may include unintentional dictation errors.   Blake Divine, MD 12/16/18 [id number]

## 2018-12-16 NOTE — ED Triage Notes (Signed)
Pt to ED via EMS from home. PT had unwitnessed fall. Lac to back of head and L arm. Family says pt was awake when they found him, pt does not remember. PT does take eliquis. PT is alert and oriented.

## 2018-12-23 ENCOUNTER — Ambulatory Visit: Payer: Medicare Other | Admitting: Urology

## 2019-01-12 ENCOUNTER — Ambulatory Visit (INDEPENDENT_AMBULATORY_CARE_PROVIDER_SITE_OTHER): Payer: Medicare Other | Admitting: Urology

## 2019-01-12 ENCOUNTER — Other Ambulatory Visit: Payer: Self-pay

## 2019-01-12 ENCOUNTER — Encounter: Payer: Self-pay | Admitting: Urology

## 2019-01-12 VITALS — BP 163/82 | HR 88 | Ht 69.0 in | Wt 178.0 lb

## 2019-01-12 DIAGNOSIS — N471 Phimosis: Secondary | ICD-10-CM | POA: Diagnosis not present

## 2019-01-12 LAB — BLADDER SCAN AMB NON-IMAGING: Scan Result: 67

## 2019-01-12 MED ORDER — NYSTATIN-TRIAMCINOLONE 100000-0.1 UNIT/GM-% EX OINT: 1.0000 "application " | TOPICAL_OINTMENT | Freq: Two times a day (BID) | CUTANEOUS | 0 refills | Status: AC

## 2019-01-12 NOTE — Progress Notes (Addendum)
   01/12/2019 4:01 PM   Sean Armstrong [birth year] [id number]  Reason for visit: Follow up phimosis  HPI: I saw [name] back in urology clinic with his son today to discuss his phimosis.  He is a 78 year old male that I originally saw in September 2019 whose comorbidities are notable for Parkinson's disease, CAD, stroke, diabetes, DVT, and atrial fibrillation on apixaban.  He has had difficulty with phimosis with urinary dribbling and some difficulty voiding.  We originally had set him up for an in office dorsal slit last year, but he never followed up.  He is emptying well with a PVR of only 65 mL in clinic today.  On exam he does have some moderate phimosis with some inflammation.  The meatus appears widely patent, but I am not quite able to completely retract the foreskin.  A hemostat was used to gently probe the urethra and it was widely patent and there is no meatal stenosis.  We again discussed the risks and benefits at length of a circumcision versus a dorsal slit in the setting of his comorbidities.  I would be willing to perform a dorsal slit in clinic, but he does need to come off his apixaban at least 3 to 4 days.  I recommended a trial of Mycolog cream twice daily to see if this improves his symptoms enough that he does not require procedure.  Both the patient and his son are amenable to this.  -Virtual visit 4 weeks for symptom check -If persistent bothersome phimosis, consider dorsal slit in clinic under local, will need clearance to hold apixaban at least 3 to 4 days total  A total of 15 minutes were spent face-to-face with the patient, greater than 50% was spent in patient education, counseling, and coordination of care regarding phimosis and dorsal slit.  Billey Co, [facility] [address], [place], [place] [address] [phone]

## 2019-01-13 LAB — URINALYSIS, COMPLETE
Bilirubin, UA: NEGATIVE
Ketones, UA: NEGATIVE
Nitrite, UA: NEGATIVE
RBC, UA: NEGATIVE
Specific Gravity, UA: >1.03 — ABNORMAL HIGH (ref 1.005–1.030)
Urobilinogen, Ur: 0.2 mg/dL (ref 0.2–1.0)
pH, UA: 5 (ref 5.0–7.5)

## 2019-01-13 LAB — MICROSCOPIC EXAMINATION: RBC, Urine: NONE SEEN /hpf (ref 0–2)

## 2019-01-15 LAB — CULTURE, URINE COMPREHENSIVE

## 2019-02-01 ENCOUNTER — Emergency Department: Payer: Medicare Other

## 2019-02-01 ENCOUNTER — Other Ambulatory Visit: Payer: Self-pay

## 2019-02-01 ENCOUNTER — Telehealth: Payer: Medicare Other | Admitting: Urology

## 2019-02-01 ENCOUNTER — Encounter: Payer: Self-pay | Admitting: Emergency Medicine

## 2019-02-01 ENCOUNTER — Inpatient Hospital Stay
Admission: EM | Admit: 2019-02-01 | Discharge: 2019-02-04 | DRG: 637 | Disposition: A | Discharge status: Skilled Nursing Facility | Payer: Medicare Other | Attending: Internal Medicine | Admitting: Internal Medicine

## 2019-02-01 DIAGNOSIS — K219 Gastro-esophageal reflux disease without esophagitis: Secondary | ICD-10-CM | POA: Diagnosis present

## 2019-02-01 DIAGNOSIS — Z880 Allergy status to penicillin: Secondary | ICD-10-CM

## 2019-02-01 DIAGNOSIS — E785 Hyperlipidemia, unspecified: Secondary | ICD-10-CM | POA: Diagnosis present

## 2019-02-01 DIAGNOSIS — I4891 Unspecified atrial fibrillation: Secondary | ICD-10-CM | POA: Diagnosis not present

## 2019-02-01 DIAGNOSIS — E1151 Type 2 diabetes mellitus with diabetic peripheral angiopathy without gangrene: Secondary | ICD-10-CM

## 2019-02-01 DIAGNOSIS — Z7901 Long term (current) use of anticoagulants: Secondary | ICD-10-CM

## 2019-02-01 DIAGNOSIS — E162 Hypoglycemia, unspecified: Secondary | ICD-10-CM | POA: Diagnosis not present

## 2019-02-01 DIAGNOSIS — G20 Parkinson's disease: Secondary | ICD-10-CM | POA: Diagnosis present

## 2019-02-01 DIAGNOSIS — U071 COVID-19: Secondary | ICD-10-CM | POA: Diagnosis present

## 2019-02-01 DIAGNOSIS — Z91041 Radiographic dye allergy status: Secondary | ICD-10-CM

## 2019-02-01 DIAGNOSIS — I252 Old myocardial infarction: Secondary | ICD-10-CM

## 2019-02-01 DIAGNOSIS — J45909 Unspecified asthma, uncomplicated: Secondary | ICD-10-CM | POA: Diagnosis present

## 2019-02-01 DIAGNOSIS — Z9181 History of falling: Secondary | ICD-10-CM

## 2019-02-01 DIAGNOSIS — E119 Type 2 diabetes mellitus without complications: Secondary | ICD-10-CM

## 2019-02-01 DIAGNOSIS — Z96653 Presence of artificial knee joint, bilateral: Secondary | ICD-10-CM | POA: Diagnosis present

## 2019-02-01 DIAGNOSIS — Z8711 Personal history of peptic ulcer disease: Secondary | ICD-10-CM

## 2019-02-01 DIAGNOSIS — Z794 Long term (current) use of insulin: Secondary | ICD-10-CM

## 2019-02-01 DIAGNOSIS — D61818 Other pancytopenia: Secondary | ICD-10-CM | POA: Diagnosis present

## 2019-02-01 DIAGNOSIS — Z833 Family history of diabetes mellitus: Secondary | ICD-10-CM

## 2019-02-01 DIAGNOSIS — Z8719 Personal history of other diseases of the digestive system: Secondary | ICD-10-CM

## 2019-02-01 DIAGNOSIS — Z7952 Long term (current) use of systemic steroids: Secondary | ICD-10-CM

## 2019-02-01 DIAGNOSIS — E039 Hypothyroidism, unspecified: Secondary | ICD-10-CM | POA: Diagnosis present

## 2019-02-01 DIAGNOSIS — J90 Pleural effusion, not elsewhere classified: Secondary | ICD-10-CM | POA: Diagnosis present

## 2019-02-01 DIAGNOSIS — Z885 Allergy status to narcotic agent status: Secondary | ICD-10-CM

## 2019-02-01 DIAGNOSIS — Z955 Presence of coronary angioplasty implant and graft: Secondary | ICD-10-CM

## 2019-02-01 DIAGNOSIS — E860 Dehydration: Secondary | ICD-10-CM | POA: Diagnosis present

## 2019-02-01 DIAGNOSIS — M069 Rheumatoid arthritis, unspecified: Secondary | ICD-10-CM | POA: Diagnosis present

## 2019-02-01 DIAGNOSIS — I2510 Atherosclerotic heart disease of native coronary artery without angina pectoris: Secondary | ICD-10-CM | POA: Diagnosis present

## 2019-02-01 DIAGNOSIS — Z881 Allergy status to other antibiotic agents status: Secondary | ICD-10-CM

## 2019-02-01 DIAGNOSIS — I10 Essential (primary) hypertension: Secondary | ICD-10-CM | POA: Insufficient documentation

## 2019-02-01 DIAGNOSIS — Z8249 Family history of ischemic heart disease and other diseases of the circulatory system: Secondary | ICD-10-CM

## 2019-02-01 DIAGNOSIS — Z87891 Personal history of nicotine dependence: Secondary | ICD-10-CM

## 2019-02-01 DIAGNOSIS — Z87442 Personal history of urinary calculi: Secondary | ICD-10-CM

## 2019-02-01 DIAGNOSIS — Z86711 Personal history of pulmonary embolism: Secondary | ICD-10-CM

## 2019-02-01 DIAGNOSIS — Z86718 Personal history of other venous thrombosis and embolism: Secondary | ICD-10-CM

## 2019-02-01 DIAGNOSIS — Z79899 Other long term (current) drug therapy: Secondary | ICD-10-CM

## 2019-02-01 DIAGNOSIS — Z888 Allergy status to other drugs, medicaments and biological substances status: Secondary | ICD-10-CM

## 2019-02-01 DIAGNOSIS — E11649 Type 2 diabetes mellitus with hypoglycemia without coma: Secondary | ICD-10-CM | POA: Diagnosis not present

## 2019-02-01 DIAGNOSIS — Z96641 Presence of right artificial hip joint: Secondary | ICD-10-CM | POA: Diagnosis present

## 2019-02-01 DIAGNOSIS — Z89421 Acquired absence of other right toe(s): Secondary | ICD-10-CM

## 2019-02-01 LAB — HEMOGLOBIN A1C
Hgb A1c MFr Bld: 7.2 % — ABNORMAL HIGH (ref 4.8–5.6)
Mean Plasma Glucose: 159.94 mg/dL

## 2019-02-01 LAB — GLUCOSE, CAPILLARY
Glucose-Capillary: 46 mg/dL — ABNORMAL LOW (ref 70–99)
Glucose-Capillary: 70 mg/dL (ref 70–99)
Glucose-Capillary: 46 mg/dL — ABNORMAL LOW (ref 70–99)
Glucose-Capillary: 47 mg/dL — ABNORMAL LOW (ref 70–99)
Glucose-Capillary: 134 mg/dL — ABNORMAL HIGH (ref 70–99)
Glucose-Capillary: 101 mg/dL — ABNORMAL HIGH (ref 70–99)
Glucose-Capillary: 95 mg/dL (ref 70–99)
Glucose-Capillary: 67 mg/dL — ABNORMAL LOW (ref 70–99)
Glucose-Capillary: 88 mg/dL (ref 70–99)
Glucose-Capillary: 111 mg/dL — ABNORMAL HIGH (ref 70–99)
Glucose-Capillary: 120 mg/dL — ABNORMAL HIGH (ref 70–99)
Glucose-Capillary: 126 mg/dL — ABNORMAL HIGH (ref 70–99)
Glucose-Capillary: 177 mg/dL — ABNORMAL HIGH (ref 70–99)

## 2019-02-01 LAB — URINALYSIS, COMPLETE (UACMP) WITH MICROSCOPIC
Bacteria, UA: NONE SEEN
Bilirubin Urine: NEGATIVE
Glucose, UA: NEGATIVE mg/dL
Hgb urine dipstick: NEGATIVE
Ketones, ur: NEGATIVE mg/dL
Leukocytes,Ua: NEGATIVE
Nitrite: NEGATIVE
Protein, ur: NEGATIVE mg/dL
Specific Gravity, Urine: 1.015 (ref 1.005–1.030)
Squamous Epithelial / HPF: NONE SEEN (ref 0–5)
pH: 5 (ref 5.0–8.0)

## 2019-02-01 LAB — CBC
HCT: 30.2 % — ABNORMAL LOW (ref 39.0–52.0)
Hemoglobin: 9.1 g/dL — ABNORMAL LOW (ref 13.0–17.0)
MCH: 24.1 pg — ABNORMAL LOW (ref 26.0–34.0)
MCHC: 30.1 g/dL (ref 30.0–36.0)
MCV: 80.1 fL (ref 80.0–100.0)
Platelets: 197 10*3/uL (ref 150–400)
RBC: 3.77 MIL/uL — ABNORMAL LOW (ref 4.22–5.81)
RDW: 16.5 % — ABNORMAL HIGH (ref 11.5–15.5)
WBC: 5.1 10*3/uL (ref 4.0–10.5)
nRBC: 0 % (ref 0.0–0.2)

## 2019-02-01 LAB — COMPREHENSIVE METABOLIC PANEL
ALT: 14 U/L (ref 0–44)
AST: 29 U/L (ref 15–41)
Albumin: 2.9 g/dL — ABNORMAL LOW (ref 3.5–5.0)
Alkaline Phosphatase: 42 U/L (ref 38–126)
Anion gap: 8 (ref 5–15)
BUN: 21 mg/dL (ref 8–23)
CO2: 22 mmol/L (ref 22–32)
Calcium: 9.3 mg/dL (ref 8.9–10.3)
Chloride: 109 mmol/L (ref 98–111)
Creatinine, Ser: 1.21 mg/dL (ref 0.61–1.24)
GFR calc Af Amer: >60 mL/min (ref >60)
GFR calc non Af Amer: 56 mL/min — ABNORMAL LOW (ref >60)
Glucose, Bld: 60 mg/dL — ABNORMAL LOW (ref 70–99)
Potassium: 3.8 mmol/L (ref 3.5–5.1)
Sodium: 139 mmol/L (ref 135–145)
Total Bilirubin: 0.6 mg/dL (ref 0.3–1.2)
Total Protein: 6 g/dL — ABNORMAL LOW (ref 6.5–8.1)

## 2019-02-01 LAB — SARS CORONAVIRUS 2 (TAT 6-24 HRS): SARS Coronavirus 2: POSITIVE — AB

## 2019-02-01 MED ORDER — APIXABAN 5 MG PO TABS
5.0000 mg | ORAL_TABLET | Freq: Two times a day (BID) | ORAL | Status: DC
  Administered 2019-02-01: 5 mg via ORAL
  Filled 2019-02-01: qty 1

## 2019-02-01 MED ORDER — TRAZODONE HCL 50 MG PO TABS
75.0000 mg | ORAL_TABLET | Freq: Every day | ORAL | Status: DC
  Administered 2019-02-01: 75 mg via ORAL
  Filled 2019-02-01: qty 2

## 2019-02-01 MED ORDER — DEXTROSE-NACL 5-0.45 % IV SOLN
INTRAVENOUS | Status: DC
  Administered 2019-02-01 – 2019-02-02 (×2): via INTRAVENOUS

## 2019-02-01 MED ORDER — OXYCODONE HCL 5 MG PO TABS
5.0000 mg | ORAL_TABLET | ORAL | Status: DC | PRN
  Administered 2019-02-02 – 2019-02-04 (×2): 5 mg via ORAL
  Filled 2019-02-01 (×2): qty 1

## 2019-02-01 MED ORDER — HYDROXYCHLOROQUINE SULFATE 200 MG PO TABS
200.0000 mg | ORAL_TABLET | Freq: Two times a day (BID) | ORAL | Status: DC
  Administered 2019-02-01 (×2): 200 mg via ORAL
  Filled 2019-02-01 (×3): qty 1

## 2019-02-01 MED ORDER — PREDNISONE 10 MG PO TABS
5.0000 mg | ORAL_TABLET | Freq: Every day | ORAL | Status: DC
  Administered 2019-02-02 – 2019-02-04 (×3): 5 mg via ORAL
  Filled 2019-02-01 (×3): qty 1

## 2019-02-01 MED ORDER — CARBIDOPA-LEVODOPA 25-100 MG PO TABS
1.0000 | ORAL_TABLET | Freq: Four times a day (QID) | ORAL | Status: DC
  Administered 2019-02-01 (×3): 1 via ORAL
  Filled 2019-02-01 (×5): qty 1

## 2019-02-01 MED ORDER — DEXTROSE 10 % IV SOLN: INTRAVENOUS | Status: DC

## 2019-02-01 MED ORDER — DEXTROSE 50 % IV SOLN
25.0000 g | Freq: Once | INTRAVENOUS | Status: AC
  Administered 2019-02-01: 25 g via INTRAVENOUS
  Filled 2019-02-01: qty 50

## 2019-02-01 MED ORDER — ATENOLOL 25 MG PO TABS
12.5000 mg | ORAL_TABLET | Freq: Every day | ORAL | Status: DC
  Filled 2019-02-01: qty 1
  Filled 2019-02-01: qty 0.5

## 2019-02-01 MED ORDER — TAMSULOSIN HCL 0.4 MG PO CAPS: 0.4000 mg | ORAL_CAPSULE | Freq: Every day | ORAL | Status: DC

## 2019-02-01 MED ORDER — HYPROMELLOSE (GONIOSCOPIC) 2.5 % OP SOLN: 1.0000 [drp] | Freq: Three times a day (TID) | OPHTHALMIC | Status: DC | PRN

## 2019-02-01 MED ORDER — POLYVINYL ALCOHOL 1.4 % OP SOLN
1.0000 [drp] | Freq: Three times a day (TID) | OPHTHALMIC | Status: DC | PRN
  Filled 2019-02-01: qty 15

## 2019-02-01 MED ORDER — LORATADINE 10 MG PO TABS: 10.0000 mg | ORAL_TABLET | Freq: Every day | ORAL | Status: DC

## 2019-02-01 MED ORDER — DEXTROSE 50 % IV SOLN
12.5000 g | INTRAVENOUS | Status: AC
  Administered 2019-02-01: 12.5 g via INTRAVENOUS
  Filled 2019-02-01: qty 50

## 2019-02-01 MED ORDER — DEXTROSE-NACL 5-0.45 % IV SOLN: INTRAVENOUS | Status: DC

## 2019-02-01 MED ORDER — GABAPENTIN 100 MG PO CAPS
100.0000 mg | ORAL_CAPSULE | Freq: Every day | ORAL | Status: DC
  Administered 2019-02-01: 100 mg via ORAL
  Filled 2019-02-01: qty 1

## 2019-02-01 NOTE — ED Notes (Signed)
Pt given orange juice.

## 2019-02-01 NOTE — ED Notes (Signed)
Dietary called to bring pt food tray

## 2019-02-01 NOTE — ED Notes (Signed)
Admitting doctor informed on pt's last two BG. Md ordered ensure 3x daily.

## 2019-02-01 NOTE — ED Notes (Signed)
Pt given 2 Ensure and given warm blanket at this time.

## 2019-02-01 NOTE — ED Notes (Signed)
Positive covid result called from [facility] lab, 2A nurse made aware.

## 2019-02-01 NOTE — ED Notes (Addendum)
Pt given meal tray and apple juice. Pt repositioned in bed.

## 2019-02-01 NOTE — ED Notes (Signed)
Pt assisted back into bed. Applied new diaper onto patient. Pt pulled up into bed. Pt resting comfortably at this time.

## 2019-02-01 NOTE — ED Notes (Signed)
Informed MD Archie Balboa of pt's BS of 68. Per Archie Balboa let him eat his lunch and will recheck shortly.

## 2019-02-01 NOTE — ED Notes (Signed)
Pt changed and dried at this time. Pt repositioned in bed.

## 2019-02-01 NOTE — ED Notes (Signed)
Iv dressing changed.

## 2019-02-01 NOTE — ED Notes (Signed)
Dietary contacted to send pt ensure.

## 2019-02-01 NOTE — ED Notes (Signed)
Pt repositioned in bed at this time

## 2019-02-01 NOTE — ED Notes (Signed)
EMS pt from home , EMS escorted pt via wheelchair , pt with low blood sugar . Last night EMS responded to same complaint last night . Today CBG was 32 via EMS , administered D10 with recheck CBG of 132.  Pt with multiple bruises, frequent falls . Hx of skin cancer / drsg left facial region.

## 2019-02-01 NOTE — ED Notes (Signed)
Family at bedside. 

## 2019-02-01 NOTE — ED Notes (Signed)
Pt given two orange juice's. Pt ate entire meal tray.

## 2019-02-01 NOTE — ED Notes (Signed)
Attempted to call floor for pt's assignment. Placed on hold. Had to hang up, will try again.

## 2019-02-01 NOTE — TOC Initial Note (Addendum)
Transition of Care [facility]) - Initial/Assessment Note    Patient Details  Name: Sean Armstrong MRN: [id number] Date of Birth: 10/31/1940  [facility]) CM/SW Contact:    Anselm Pancoast, RN Phone Number: 02/01/2019, 11:08 AM  Clinical Narrative:                 78 year old admitted via EMS with low blood sugar. Son is at bedside and providing history. Patient lives with spouse at home and has personal care services that come six days a week from 8am-2pm. Aide has been quarantined for the last week due to cough/cold symptoms and unable to assist patient. Family is assisting as needed. Patient is also active with [facility].   Expected Discharge Plan: [place]     Patient Goals and CMS Choice Patient states their goals for this hospitalization and ongoing recovery are:: Home with family      Expected Discharge Plan and Services Expected Discharge Plan: [place]       Living arrangements for the past 2 months: Single Family Home                                      Prior Living Arrangements/Services Living arrangements for the past 2 months: Single Family Home Lives with:: Spouse Patient language and need for interpreter reviewed:: Yes Do you feel safe going back to the place where you live?: Yes      Need for Family Participation in Patient Care: Yes (Comment) Care giver support system in place?: Yes (comment) Current home services: Sitter, Home RN Criminal Activity/Legal Involvement Pertinent to Current Situation/Hospitalization: No - Comment as needed  Activities of Daily Living      Permission Sought/Granted Permission sought to share information with : Case Manager, Guardian, Customer service manager Permission granted to share information with : Yes, Verbal Permission Granted  Share Information with NAME: Aidian Whatley        Permission granted to share info w Contact Information: son  Emotional  Assessment Appearance:: Appears stated age Attitude/Demeanor/Rapport: Engaged Affect (typically observed): Accepting Orientation: : Oriented to Self, Oriented to Place, Oriented to  Time, Oriented to Situation Alcohol / Substance Use: Never Used Psych Involvement: No (comment)  Admission diagnosis:  hypoglycemia ems Patient Active Problem List   Diagnosis Date Noted  . Carotid stenosis 02/25/2018  . AAA (abdominal aortic aneurysm) without rupture ([place]) 09/29/2016  . PAD (peripheral artery disease) ([place]) 09/29/2016  . Rib fracture 08/08/2016  . Constipation 08/08/2016  . Deep vein thrombosis (DVT) of popliteal vein of right lower extremity ([place]) 06/18/2016  . Left leg weakness 12/05/2015  . UTI (lower urinary tract infection) 12/05/2015  . Diabetes ([place]) 12/05/2015  . Anxiety 12/05/2015  . GERD (gastroesophageal reflux disease) 12/05/2015  . HLD (hyperlipidemia) 12/05/2015  . BPH (benign prostatic hyperplasia) 12/05/2015  . A-fib ([place]) 12/05/2015  . Sepsis ([place]) 06/18/2015   PCP:  Albina Billet, MD Pharmacy:   [facility] [place] - [place] [address] Phone: [phone] Fax: [phone]  [id number] - [place], [place] [address] Phone: [phone] Fax: [phone]     Social Determinants of Health (SDOH) Interventions    Readmission Risk Interventions No flowsheet data found.

## 2019-02-01 NOTE — ED Notes (Signed)
Pt transported to floor by EDT Step at this time

## 2019-02-01 NOTE — ED Notes (Signed)
Entered room and pt had taken out his IV. Pt covered with blood on sheets. Pt ate all of meal tray. Pt also had Ensure spilled on floor. Pt cleaned up and repositioned at this time.  Pt checked and dry at this time. IV fluids hooked to other IV on left arm. Pt resting comfortably at this time.

## 2019-02-01 NOTE — ED Provider Notes (Signed)
[facility] Emergency Department Provider Note       Time seen: ----------------------------------------- 10:33 AM on 02/01/2019 -----------------------------------------   I have reviewed the triage vital signs and the nursing notes.  HISTORY   Chief Complaint Hypoglycemia   HPI Sean Armstrong is a 78 y.o. male with a history of anemia, asthma, diabetes, hypertension, hyperlipidemia, hypothyroidism, Parkinson's disease, renal insufficiency, rheumatoid arthritis who presents to the ED for hypoglycemia.  D50 was given by EMS, sugar came up to 132 and glucose seem to be trending down again.  Patient is a poor historian, was alert but slow to respond.  Past Medical History:  Diagnosis Date  . Anemia   . Asthma   . Colon polyp   . Coronary artery disease   . Diabetes mellitus without complication ([place])   . Dysrhythmia   . Falls frequently   . FHx: SVT (supraventricular tachycardia) 1998  . GERD (gastroesophageal reflux disease)   . History of DVT (deep vein thrombosis) 2009  . History of kidney stones   . History of pulmonary embolism 08/2008  . Hyperlipidemia   . Hypertension   . Hypothyroidism   . Lumbar disc disease   . MGUS (monoclonal gammopathy of unknown significance)   . Myocardial infarction ([place])    1995  . Nephrolithiasis   . Osteoarthritis   . Parkinson's disease ([place])   . Peptic ulcer disease   . Renal insufficiency   . Rheumatoid arthritis [facility])     Patient Active Problem List   Diagnosis Date Noted  . Carotid stenosis 02/25/2018  . AAA (abdominal aortic aneurysm) without rupture ([place]) 09/29/2016  . PAD (peripheral artery disease) ([place]) 09/29/2016  . Rib fracture 08/08/2016  . Constipation 08/08/2016  . Deep vein thrombosis (DVT) of popliteal vein of right lower extremity ([place]) 06/18/2016  . Left leg weakness 12/05/2015  . UTI (lower urinary tract infection) 12/05/2015  . Diabetes ([place]) 12/05/2015  . Anxiety 12/05/2015  .  GERD (gastroesophageal reflux disease) 12/05/2015  . HLD (hyperlipidemia) 12/05/2015  . BPH (benign prostatic hyperplasia) 12/05/2015  . A-fib ([place]) 12/05/2015  . Sepsis ([place]) 06/18/2015    Past Surgical History:  Procedure Laterality Date  . ABLATION     av node entry  . AMPUTATION TOE Right 10/08/2018   Procedure: AMPUTATION TOE MPJ;  Surgeon: Sharlotte Alamo, DPM;  Location: [facility] ORS;  Service: Podiatry;  Laterality: Right;  . CARDIAC CATHETERIZATION    . CHOLECYSTECTOMY    . COLONOSCOPY  03/2007, 2006  . CORONARY ANGIOPLASTY     stents x 2  . CORONARY ARTERY BYPASS GRAFT    . ESOPHAGOGASTRODUODENOSCOPY (EGD) WITH PROPOFOL N/A 07/02/2015   Procedure: ESOPHAGOGASTRODUODENOSCOPY (EGD) WITH PROPOFOL;  Surgeon: Manya Silvas, MD;  Location[facility] ENDOSCOPY;  Service: Endoscopy;  Laterality: N/A;  . EYE SURGERY    . JOINT REPLACEMENT    . NASAL/SINUS ENDOSCOPY    . REPLACEMENT TOTAL KNEE BILATERAL    . TOTAL HIP ARTHROPLASTY Right     Allergies Gentamycin [gentamicin], Iodinated diagnostic agents, Meloxicam, Metrizamide, Penicillin v potassium, Celecoxib, Ciprofloxacin, Hydrocodone, and Propranolol  Social History Social History   Tobacco Use  . Smoking status: Former Smoker    Types: Cigarettes  . Smokeless tobacco: Current User    Types: Chew  . Tobacco comment: quit smoking cigerates 1995  Substance Use Topics  . Alcohol use: No    Alcohol/week: 0.0 standard drinks  . Drug use: No   Review of Systems Constitutional: Negative for  fever. Cardiovascular: Negative for chest pain. Respiratory: Negative for shortness of breath. Gastrointestinal: Negative for abdominal pain, vomiting and diarrhea. Musculoskeletal: Negative for back pain. Skin: Negative for rash. Neurological: Negative for headaches, positive for weakness  All systems negative/normal/unremarkable except as stated in the HPI  ____________________________________________   PHYSICAL EXAM:  VITAL  SIGNS: ED Triage Vitals  Enc Vitals Group     BP 02/01/19 1003 (!) 112/96     Pulse Rate 02/01/19 1003 66     Resp 02/01/19 1003 16     Temp --      Temp src --      SpO2 02/01/19 1003 97 %     Weight --      Height --      Head Circumference --      Peak Flow --      Pain Score 02/01/19 1004 0     Pain Loc --      Pain Edu? --      Excl. in [place]? --    Constitutional: Alert, No distress Eyes: Normal extraocular movements. ENT      Head: Normocephalic and atraumatic.      Nose: No congestion/rhinnorhea.      Mouth/Throat: Mucous membranes are moist.      Neck: No stridor. Cardiovascular: Normal rate, regular rhythm. No murmurs, rubs, or gallops. Respiratory: Normal respiratory effort without tachypnea nor retractions. Breath sounds are clear and equal bilaterally. No wheezes/rales/rhonchi. Gastrointestinal: Soft and nontender. Normal bowel sounds Musculoskeletal: Nontender with normal range of motion in extremities. No lower extremity tenderness nor edema. Neurologic:  Normal speech and language. No gross focal neurologic deficits are appreciated.  Generalized weakness, nothing focal Skin:  Skin is warm, dry and intact. No rash noted. Psychiatric: Mood and affect are normal.  ____________________________________________  EKG: Interpreted by me.  Atrial fibrillation the rate of 66 bpm, possible anterior infarct age-indeterminate, normal axis, normal QT  ____________________________________________  ED COURSE:  As part of my medical decision making, I reviewed the following data within the [place] History obtained from family if available, nursing notes, old chart and ekg, as well as notes from prior ED visits. Patient presented for hypoglycemia, we will assess with labs as indicated at this time.   Procedures  Sean Armstrong was evaluated in Emergency Department on 02/01/2019 for the symptoms described in the history of present illness. He was evaluated  in the context of the global COVID-19 pandemic, which necessitated consideration that the patient might be at risk for infection with the SARS-CoV-2 virus that causes COVID-19. Institutional protocols and algorithms that pertain to the evaluation of patients at risk for COVID-19 are in a state of rapid change based on information released by regulatory bodies including the CDC and federal and state organizations. These policies and algorithms were followed during the patient's care in the ED.  ____________________________________________   LABS (pertinent positives/negatives)  Labs Reviewed  COMPREHENSIVE METABOLIC PANEL - Abnormal; Notable for the following components:      Result Value   Glucose, Bld 60 (*)    Total Protein 6.0 (*)    Albumin 2.9 (*)    GFR calc non Af Amer 56 (*)    All other components within normal limits  CBC - Abnormal; Notable for the following components:   RBC 3.77 (*)    Hemoglobin 9.1 (*)    HCT 30.2 (*)    MCH 24.1 (*)    RDW 16.5 (*)  All other components within normal limits  GLUCOSE, CAPILLARY - Abnormal; Notable for the following components:   Glucose-Capillary 46 (*)    All other components within normal limits  GLUCOSE, CAPILLARY - Abnormal; Notable for the following components:   Glucose-Capillary 47 (*)    All other components within normal limits  GLUCOSE, CAPILLARY - Abnormal; Notable for the following components:   Glucose-Capillary 46 (*)    All other components within normal limits  GLUCOSE, CAPILLARY - Abnormal; Notable for the following components:   Glucose-Capillary 134 (*)    All other components within normal limits  GLUCOSE, CAPILLARY  URINALYSIS, COMPLETE (UACMP) WITH MICROSCOPIC  CBG MONITORING, ED    Radiology: Chest x-ray IMPRESSION: Possible trace right pleural effusion. ____________________________________________   DIFFERENTIAL DIAGNOSIS   Hypoglycemia, dehydration, electrolyte abnormality, occult infection,  overdose  FINAL ASSESSMENT AND PLAN  Hypoglycemia   Plan: The patient had presented for hypoglycemia. Patient's labs did reveal recurrent hypoglycemia of unclear etiology at this time.  He was placed on a drip for hypoglycemia.  He did have additional drops in his blood sugar which are likely related to glimepiride.  I will discuss with the hospitalist for admission.   Laurence Aly, MD    Note: This note was generated in part or whole with voice recognition software. Voice recognition is usually quite accurate but there are transcription errors that can and very often do occur. I apologize for any typographical errors that were not detected and corrected.     Earleen Newport, MD 02/01/19 [id number]

## 2019-02-01 NOTE — ED Notes (Signed)
Pt given 8oz of orange juice in triage

## 2019-02-01 NOTE — ED Notes (Signed)
X-ray at bedside

## 2019-02-01 NOTE — ED Notes (Signed)
Pt's son at bedside. Pt's son reports that pt has the "shakes".

## 2019-02-01 NOTE — ED Notes (Signed)
Pt on toilet and stated he needs to have a BM. Pt's speech a little slurred at this time and little lethargic Per family this is usually when his sugar drops. Pt assisted back to bed. Will check BS at this time. Meal tray at bedside

## 2019-02-01 NOTE — ED Notes (Signed)
Pt given meal tray.

## 2019-02-01 NOTE — H&P (Signed)
History and Physical    Sean Armstrong [id number] DOB: 1941/01/13 DOA: 02/01/2019  PCP: Albina Billet, MD (Confirm with patient/family/NH records and if not entered, this has to be entered at [facility] point of entry) Patient coming from: home  I have personally briefly reviewed patient's old medical records in [place]  Chief Complaint: low sugars  HPI: Sean Armstrong is a 78 y.o. male with medical history significant of   Pt was making a grunting noise today at 2:30 am in bed and ems for this and they checked sugars and fed him sandwich and went to bed. Pt took his glimepiride yesterday am, his insulin was stopped over a year ago.  Pt is taken care of by wife , his sugar came up . This am he was in same condition and called ems again and fed egg sandwich at home and brought him here.Pt has h/o falls and has PT at home which has recently released him.  ED Course:  Pt in ed is awake/ oriented and gives history. Blood pressure (!) 163/64, pulse 61, resp. rate 20, height 5\' 9"  (1.753 m), weight 80.7 kg, SpO2 100 %. His initial labs show glucose of 60 mg /dL and initial CBC shows white count of 5.1, hemoglobin of 9.1, hematocrit of 30.2, RDW of 16.5, platelets of 197.  With screening done in the emergency room is pending. Urinalysis is within normal limits.  Chest x-ray done in the emergency room shows mild right-sided pleural effusion. Review of Systems: As per HPI otherwise 10 point review of systems negative.    Past Medical History:  Diagnosis Date  . Anemia   . Asthma   . Colon polyp   . Coronary artery disease   . Diabetes mellitus without complication ([place])   . Dysrhythmia   . Falls frequently   . FHx: SVT (supraventricular tachycardia) 1998  . GERD (gastroesophageal reflux disease)   . History of DVT (deep vein thrombosis) 2009  . History of kidney stones   . History of pulmonary embolism 08/2008  . Hyperlipidemia   . Hypertension   . Hypothyroidism   . Lumbar  disc disease   . MGUS (monoclonal gammopathy of unknown significance)   . Myocardial infarction ([place])    1995  . Nephrolithiasis   . Osteoarthritis   . Parkinson's disease ([place])   . Peptic ulcer disease   . Renal insufficiency   . Rheumatoid arthritis [facility])     Past Surgical History:  Procedure Laterality Date  . ABLATION     av node entry  . AMPUTATION TOE Right 10/08/2018   Procedure: AMPUTATION TOE MPJ;  Surgeon: Sharlotte Alamo, DPM;  Location: [facility] ORS;  Service: Podiatry;  Laterality: Right;  . CARDIAC CATHETERIZATION    . CHOLECYSTECTOMY    . COLONOSCOPY  03/2007, 2006  . CORONARY ANGIOPLASTY     stents x 2  . CORONARY ARTERY BYPASS GRAFT    . ESOPHAGOGASTRODUODENOSCOPY (EGD) WITH PROPOFOL N/A 07/02/2015   Procedure: ESOPHAGOGASTRODUODENOSCOPY (EGD) WITH PROPOFOL;  Surgeon: Manya Silvas, MD;  Location: [facility] ENDOSCOPY;  Service: Endoscopy;  Laterality: N/A;  . EYE SURGERY    . JOINT REPLACEMENT    . NASAL/SINUS ENDOSCOPY    . REPLACEMENT TOTAL KNEE BILATERAL    . TOTAL HIP ARTHROPLASTY Right      reports that he has quit smoking. His smoking use included cigarettes. His smokeless tobacco use includes chew. He reports that he does not drink alcohol or use drugs.  Allergies  Allergen Reactions  . Gentamycin [Gentamicin] Other (See Comments)    Reaction:  Caused kidney issues for pt   . Iodinated Diagnostic Agents     Other reaction(s): Other (See Comments) "Burning sensation throughout whole body"  . Meloxicam     Other reaction(s): Other (See Comments) GI UPSET  . Metrizamide     Other reaction(s): Other (See Comments) "Burning sensation throughout whole body"  . Penicillin V Potassium   . Celecoxib Nausea Only    Other reaction(s): Other (See Comments) GI upset  . Ciprofloxacin Nausea Only  . Hydrocodone Nausea Only  . Propranolol Palpitations    Family History  Problem Relation Age of Onset  . Heart attack Father   . Diabetes Father   . Heart attack  Brother   . Prostate cancer Neg Hx   . Bladder Cancer Neg Hx   . Kidney cancer Neg Hx    Unacceptable: Noncontributory, unremarkable, or negative. Acceptable: Family history reviewed and not pertinent (If you reviewed it)  Prior to Admission medications   Medication Sig Start Date End Date Taking? Authorizing Provider  apixaban (ELIQUIS) 5 MG TABS tablet Take 1 tablet (5 mg total) by mouth 2 (two) times daily. 12/07/15  Yes Sudini, Alveta Heimlich, MD  atenolol (TENORMIN) 25 MG tablet Take 0.5 tablets (12.5 mg total) by mouth daily. Patient taking differently: Take 25 mg by mouth daily.  12/07/15  Yes Hillary Bow, MD  atorvastatin (LIPITOR) 80 MG tablet Take 1 tablet (80 mg total) by mouth daily at 6 PM. 12/07/15  Yes Sudini, Srikar, MD  carbidopa-levodopa (SINEMET IR) 25-100 MG tablet Take 1 tablet by mouth 4 (four) times daily.   Yes [provider]  cetirizine (ZYRTEC) 10 MG tablet Take 10 mg by mouth daily.   Yes [provider]  cyanocobalamin (,VITAMIN B-12,) 1000 MCG/ML injection Inject 1,000 mcg into the muscle every 30 (thirty) days.   Yes [provider]  fluticasone (FLONASE) 50 MCG/ACT nasal spray Place 2 sprays into both nostrils daily.   Yes [provider]  gabapentin (NEURONTIN) 100 MG capsule Take 100 mg by mouth daily.    Yes [provider]  glimepiride (AMARYL) 2 MG tablet Take 2 mg by mouth 2 (two) times daily.    Yes [provider]  hydroxychloroquine (PLAQUENIL) 200 MG tablet Take 200 mg by mouth 2 (two) times daily.   Yes [provider]  hydroxypropyl methylcellulose / hypromellose (ISOPTO TEARS / GONIOVISC) 2.5 % ophthalmic solution Place 1 drop into both eyes 3 (three) times daily as needed for dry eyes.   Yes [provider]  Multiple Vitamins-Minerals (CENTRUM SILVER PO) Take 1 tablet by mouth daily.    Yes [provider]  nystatin-triamcinolone ointment (MYCOLOG) Apply 1 application  topically 2 (two) times daily. 01/12/19  Yes Billey Co, MD  oxyCODONE-acetaminophen (PERCOCET) 5-325 MG tablet Take 1-2 tablets by mouth every 6 (six) hours as needed for moderate pain or severe pain. Patient taking differently: Take 1 tablet by mouth every 6 (six) hours as needed for moderate pain or severe pain.  08/08/16  Yes Earleen Newport, MD  potassium chloride SA (K-DUR,KLOR-CON) 20 MEQ tablet Take 20 mEq by mouth 2 (two) times daily.    Yes [provider]  predniSONE (DELTASONE) 5 MG tablet Take 5 mg by mouth daily with breakfast.    Yes [provider]  sodium chloride (OCEAN) 0.65 % SOLN nasal spray Place 1 spray into both nostrils  as needed for congestion.   Yes [provider]  sucralfate (CARAFATE) 1 g tablet Take 1 g by mouth 2 (two) times daily.   Yes [provider]  tamsulosin (FLOMAX) 0.4 MG CAPS capsule Take 0.4 mg by mouth daily after breakfast.    Yes [provider]  traZODone (DESYREL) 50 MG tablet Take 75 mg by mouth at bedtime.   Yes [provider]  triamterene-hydrochlorothiazide (MAXZIDE) 75-50 MG tablet Take 1 tablet by mouth daily.   Yes [provider]  Insulin Detemir (LEVEMIR FLEXTOUCH) 100 UNIT/ML Pen Inject 30 Units into the skin at bedtime. Patient not taking: Reported on 09/29/2018 08/10/16   Fritzi Mandes, MD  loperamide (IMODIUM) 2 MG capsule Take 1 capsule (2 mg total) by mouth every 4 (four) hours as needed for diarrhea or loose stools. Patient not taking: Reported on 09/29/2018 06/20/15   Hillary Bow, MD  oxyCODONE (ROXICODONE) 5 MG immediate release tablet Take 1 tablet (5 mg total) by mouth every 4 (four) hours as needed. Patient not taking: Reported on 02/01/2019 10/08/18 10/08/19  Sharlotte Alamo, DPM  oxyCODONE (ROXICODONE) 5 MG immediate release tablet Take 1 tablet (5 mg total) by mouth every 4 (four) hours as needed. Patient not taking: Reported on 02/01/2019 10/08/18 10/08/19  Sharlotte Alamo, DPM  polyethylene glycol powder [facility]) powder 2 cap fulls in a full glass of water, three times a day, for 5 days. Patient not taking: Reported on 09/29/2018 08/07/16   Carrie Mew, MD  senna-docusate (SENOKOT-S) 8.6-50 MG tablet Take 2 tablets by mouth 2 (two) times daily. Patient not taking: Reported on 09/29/2018 08/07/16   Carrie Mew, MD    Physical Exam: Vitals:   02/01/19 1144 02/01/19 1200 02/01/19 1230 02/01/19 1255  BP:  137/66 (!) 154/82 (!) 163/64  Pulse:    61  Resp:    20  SpO2:    100%  Weight: 80.7 kg     Height: 5\' 9"  (1.753 m)       Constitutional: NAD, calm, comfortable Vitals:   02/01/19 1144 02/01/19 1200 02/01/19 1230 02/01/19 1255  BP:  137/66 (!) 154/82 (!) 163/64  Pulse:    61  Resp:    20  SpO2:    100%  Weight: 80.7 kg     Height: 5\' 9"  (1.753 m)      Eyes: PERRL, lids and conjunctivae normal ENMT: Mucous membranes are moist. Posterior pharynx clear of any exudate or lesions.Normal dentition.  Neck: normal, supple, no masses, no thyromegaly Respiratory: clear to auscultation bilaterally, no wheezing, no crackles. Normal respiratory effort. No accessory muscle use.  Cardiovascular: Regular rate and rhythm, no appreciable murmurs / rubs / gallops. No extremity edema. 2+ pedal pulses. No carotid bruits.  Abdomen: no tenderness, no masses palpated. No hepatosplenomegaly. Bowel sounds positive.  Musculoskeletal: no clubbing / cyanosis. No joint deformity upper and lower extremities. Good ROM, no contractures. Normal muscle tone.  Skin: no rashes, lesions, ulcers. No induration Neurologic: CN 2-12 grossly intact.  Strength 5/5 in all 4.  Psychiatric: Normal judgment and insight. Alert and oriented x 3. Normal mood.    Labs on Admission: I have personally reviewed following labs and imaging studies  CBC: Recent Labs  Lab 02/01/19 1008  WBC 5.1  HGB 9.1*  HCT 30.2*  MCV 80.1  PLT [id number]   Basic Metabolic Panel: Recent Labs   Lab 02/01/19 1008  NA 139  K 3.8  CL 109  CO2 22  GLUCOSE 60*  BUN 21  CREATININE 1.21  CALCIUM 9.3   GFR: Estimated Creatinine Clearance: 48.7 mL/min (by C-G formula based on SCr of 1.21 mg/dL). Liver Function Tests: Recent Labs  Lab 02/01/19 1008  AST 29  ALT 14  ALKPHOS 42  BILITOT 0.6  PROT 6.0*  ALBUMIN 2.9*   No results for input(s): LIPASE, AMYLASE in the last 168 hours. No results for input(s): AMMONIA in the last 168 hours. Coagulation Profile: No results for input(s): INR, PROTIME in the last 168 hours. Cardiac Enzymes: No results for input(s): CKTOTAL, CKMB, CKMBINDEX, TROPONINI in the last 168 hours. BNP (last 3 results) No results for input(s): PROBNP in the last 8760 hours. HbA1C: No results for input(s): HGBA1C in the last 72 hours. CBG: Recent Labs  Lab 02/01/19 1023 02/01/19 1052 02/01/19 1103 02/01/19 1144 02/01/19 1312  GLUCAP 46* 47* 46* 134* 101*   Lipid Profile: No results for input(s): CHOL, HDL, LDLCALC, TRIG, CHOLHDL, LDLDIRECT in the last 72 hours. Thyroid Function Tests: No results for input(s): TSH, T4TOTAL, FREET4, T3FREE, THYROIDAB in the last 72 hours. Anemia Panel: No results for input(s): VITAMINB12, FOLATE, FERRITIN, TIBC, IRON, RETICCTPCT in the last 72 hours. Urine analysis:    Component Value Date/Time   COLORURINE YELLOW (A) 02/01/2019 1107   APPEARANCEUR CLEAR (A) 02/01/2019 1107   APPEARANCEUR Cloudy (A) 01/12/2019 1541   LABSPEC 1.015 02/01/2019 1107   LABSPEC 1.018 06/23/2014 1229   PHURINE 5.0 02/01/2019 1107   GLUCOSEU NEGATIVE 02/01/2019 1107   GLUCOSEU 50 mg/dL 06/23/2014 1229   HGBUR NEGATIVE 02/01/2019 1107   BILIRUBINUR NEGATIVE 02/01/2019 1107   BILIRUBINUR Negative 01/12/2019 1541   BILIRUBINUR Negative 06/23/2014 1229   KETONESUR NEGATIVE 02/01/2019 1107   PROTEINUR NEGATIVE 02/01/2019 1107   NITRITE NEGATIVE 02/01/2019 1107   LEUKOCYTESUR NEGATIVE 02/01/2019 1107   LEUKOCYTESUR Trace 06/23/2014  1229    Radiological Exams on Admission: Dg Chest 1 View  Result Date: 02/01/2019 CLINICAL DATA:  Weakness.  Hypoglycemia. EXAM: CHEST  1 VIEW COMPARISON:  01/10/2018 FINDINGS: Stable borderline heart size. Stable vascular pedicle widening. Coronary stents are noted along the left heart border. History of multiple falls. No evidence of acute fracture. Remote lateral right eighth rib fracture. Probable trace right pleural effusion. No air bronchogram, edema, or pneumothorax. IMPRESSION: Possible trace right pleural effusion. Electronically Signed   By: Monte Fantasia M.D.   On: 02/01/2019 11:24    EKG: Independently reviewed.  A. fib with a heart rate of 66.  Assessment/Plan Principal Problem:   Hypoglycemia Active Problems:   Diabetes (HCC)   GERD (gastroesophageal reflux disease)   A-fib (HCC)   Hypothyroidism   History of DVT (deep vein thrombosis)  Hypoglycemia/DMII Attribute to patient's medication regimen with glimepiride.  There are no report in change of diet or appetite per family member at bedside.  We will discontinue patient's glimepiride, A1c, sliding scale, hypoglycemia protocol. Start patient on D5 half-normal saline at 80 cc/h.  Accu-Cheks will be every 4 hours for the first 24 hours.  GERD: Protonix 40 mg daily.  Atrial fibrillation/history of DVT:  We will continue patient on has Eliquis.  Hypothyroidism: Free T4 and TSH check today.   DVT prophylaxis: Eliquis (Lovenox/Heparin/SCD's/anticoagulated/None (if comfort care) Code Status: Full (Full/Partial (specify details) Family Communication: Son at bedside ([place] name, relationship. Do not write "discussed with patient". Specify tel # if discussed over the phone) Disposition Plan: Discharge in 48 hours to home.  (specify when and where you expect patient to be discharged)  Consults called: None (with names) Admission status: Observation status.  (inpatient / obs / tele / medical floor / SDU)   Para Skeans MD [facility] Pager [phone]   If 7PM-7AM, please contact night-coverage [email or web address] Password [email or web address]  02/01/2019, 1:51 PM

## 2019-02-01 NOTE — ED Notes (Signed)
Pt cleaned up and repositioned in bed at this time. Family just found out pt's was around someone who was positive. Orthoptist. No new orders or procedures at this time.

## 2019-02-01 NOTE — ED Triage Notes (Addendum)
Pt from home via EMS, per EMS pt glucose was 32 on arrival. Was given D50 and came up to 132. PT current glucose 70. PT denies pain, pt is poor historian. Pt is A&OX4 but slow to respond. PT has bandage to side of face from a skin cancer. VS

## 2019-02-01 NOTE — ED Notes (Signed)
Patient given OJ to drink. Remains alert. Provider aware of CBG.

## 2019-02-01 NOTE — ED Notes (Signed)
Pt assisted to toilet. Pt advised to ring call bell when he is done.

## 2019-02-01 NOTE — ED Notes (Signed)
Pt assisted with urinal. Pt assisted back into bed and repositioned in the bed. Pt resting comfortably at this time

## 2019-02-01 NOTE — ED Notes (Signed)
Admitting MD at bedside.

## 2019-02-01 NOTE — ED Notes (Signed)
Pt given meal tray and repositioned in bed.  

## 2019-02-01 NOTE — ED Notes (Signed)
Son here for support Octavia Bruckner  [phone]

## 2019-02-02 DIAGNOSIS — Z885 Allergy status to narcotic agent status: Secondary | ICD-10-CM | POA: Diagnosis not present

## 2019-02-02 DIAGNOSIS — Z79899 Other long term (current) drug therapy: Secondary | ICD-10-CM | POA: Diagnosis not present

## 2019-02-02 DIAGNOSIS — I10 Essential (primary) hypertension: Secondary | ICD-10-CM | POA: Diagnosis present

## 2019-02-02 DIAGNOSIS — Z888 Allergy status to other drugs, medicaments and biological substances status: Secondary | ICD-10-CM | POA: Diagnosis not present

## 2019-02-02 DIAGNOSIS — E039 Hypothyroidism, unspecified: Secondary | ICD-10-CM

## 2019-02-02 DIAGNOSIS — U071 COVID-19: Secondary | ICD-10-CM | POA: Diagnosis present

## 2019-02-02 DIAGNOSIS — E11649 Type 2 diabetes mellitus with hypoglycemia without coma: Secondary | ICD-10-CM | POA: Diagnosis present

## 2019-02-02 DIAGNOSIS — Z86711 Personal history of pulmonary embolism: Secondary | ICD-10-CM | POA: Diagnosis not present

## 2019-02-02 DIAGNOSIS — Z880 Allergy status to penicillin: Secondary | ICD-10-CM | POA: Diagnosis not present

## 2019-02-02 DIAGNOSIS — Z86718 Personal history of other venous thrombosis and embolism: Secondary | ICD-10-CM | POA: Diagnosis not present

## 2019-02-02 DIAGNOSIS — Z91041 Radiographic dye allergy status: Secondary | ICD-10-CM | POA: Diagnosis not present

## 2019-02-02 DIAGNOSIS — D61818 Other pancytopenia: Secondary | ICD-10-CM | POA: Diagnosis present

## 2019-02-02 DIAGNOSIS — I4891 Unspecified atrial fibrillation: Secondary | ICD-10-CM

## 2019-02-02 DIAGNOSIS — E860 Dehydration: Secondary | ICD-10-CM | POA: Diagnosis present

## 2019-02-02 DIAGNOSIS — E162 Hypoglycemia, unspecified: Secondary | ICD-10-CM

## 2019-02-02 DIAGNOSIS — J90 Pleural effusion, not elsewhere classified: Secondary | ICD-10-CM | POA: Diagnosis present

## 2019-02-02 DIAGNOSIS — K219 Gastro-esophageal reflux disease without esophagitis: Secondary | ICD-10-CM

## 2019-02-02 DIAGNOSIS — E785 Hyperlipidemia, unspecified: Secondary | ICD-10-CM | POA: Diagnosis present

## 2019-02-02 DIAGNOSIS — J45909 Unspecified asthma, uncomplicated: Secondary | ICD-10-CM | POA: Diagnosis present

## 2019-02-02 DIAGNOSIS — M069 Rheumatoid arthritis, unspecified: Secondary | ICD-10-CM | POA: Diagnosis present

## 2019-02-02 DIAGNOSIS — Z955 Presence of coronary angioplasty implant and graft: Secondary | ICD-10-CM | POA: Diagnosis not present

## 2019-02-02 DIAGNOSIS — Z881 Allergy status to other antibiotic agents status: Secondary | ICD-10-CM | POA: Diagnosis not present

## 2019-02-02 DIAGNOSIS — G20 Parkinson's disease: Secondary | ICD-10-CM | POA: Diagnosis present

## 2019-02-02 DIAGNOSIS — I2510 Atherosclerotic heart disease of native coronary artery without angina pectoris: Secondary | ICD-10-CM | POA: Diagnosis present

## 2019-02-02 DIAGNOSIS — I252 Old myocardial infarction: Secondary | ICD-10-CM | POA: Diagnosis not present

## 2019-02-02 LAB — GLUCOSE, CAPILLARY
Glucose-Capillary: 185 mg/dL — ABNORMAL HIGH (ref 70–99)
Glucose-Capillary: 141 mg/dL — ABNORMAL HIGH (ref 70–99)
Glucose-Capillary: 227 mg/dL — ABNORMAL HIGH (ref 70–99)
Glucose-Capillary: 219 mg/dL — ABNORMAL HIGH (ref 70–99)

## 2019-02-02 MED ORDER — LORATADINE 10 MG PO TABS
10.0000 mg | ORAL_TABLET | Freq: Every day | ORAL | Status: DC
  Administered 2019-02-02 – 2019-02-04 (×3): 10 mg via ORAL
  Filled 2019-02-02 (×3): qty 1

## 2019-02-02 MED ORDER — ATORVASTATIN CALCIUM 80 MG PO TABS
80.0000 mg | ORAL_TABLET | Freq: Every day | ORAL | Status: DC
  Administered 2019-02-02 – 2019-02-03 (×2): 80 mg via ORAL
  Filled 2019-02-02 (×2): qty 1

## 2019-02-02 MED ORDER — HYDROXYCHLOROQUINE SULFATE 200 MG PO TABS
200.0000 mg | ORAL_TABLET | Freq: Two times a day (BID) | ORAL | Status: DC
  Administered 2019-02-02 – 2019-02-04 (×5): 200 mg via ORAL
  Filled 2019-02-02 (×6): qty 1

## 2019-02-02 MED ORDER — GABAPENTIN 100 MG PO CAPS
100.0000 mg | ORAL_CAPSULE | Freq: Every day | ORAL | Status: DC
  Administered 2019-02-02 – 2019-02-04 (×3): 100 mg via ORAL
  Filled 2019-02-02 (×3): qty 1

## 2019-02-02 MED ORDER — TAMSULOSIN HCL 0.4 MG PO CAPS
0.4000 mg | ORAL_CAPSULE | Freq: Every day | ORAL | Status: DC
  Administered 2019-02-02 – 2019-02-04 (×3): 0.4 mg via ORAL
  Filled 2019-02-02 (×3): qty 1

## 2019-02-02 MED ORDER — TRAZODONE HCL 50 MG PO TABS
75.0000 mg | ORAL_TABLET | Freq: Every day | ORAL | Status: DC
  Administered 2019-02-02 – 2019-02-03 (×2): 75 mg via ORAL
  Filled 2019-02-02 (×2): qty 2

## 2019-02-02 MED ORDER — ATENOLOL 25 MG PO TABS
12.5000 mg | ORAL_TABLET | Freq: Every day | ORAL | Status: DC
  Administered 2019-02-02 – 2019-02-04 (×3): 12.5 mg via ORAL
  Filled 2019-02-02 (×3): qty 0.5

## 2019-02-02 MED ORDER — APIXABAN 5 MG PO TABS
5.0000 mg | ORAL_TABLET | Freq: Two times a day (BID) | ORAL | Status: DC
  Administered 2019-02-02 – 2019-02-04 (×5): 5 mg via ORAL
  Filled 2019-02-02 (×5): qty 1

## 2019-02-02 MED ORDER — GUAIFENESIN 100 MG/5ML PO SOLN
5.0000 mL | ORAL | Status: DC | PRN
  Filled 2019-02-02: qty 5

## 2019-02-02 MED ORDER — SUCRALFATE 1 G PO TABS
1.0000 g | ORAL_TABLET | Freq: Two times a day (BID) | ORAL | Status: DC
  Administered 2019-02-02 – 2019-02-04 (×5): 1 g via ORAL
  Filled 2019-02-02 (×5): qty 1

## 2019-02-02 MED ORDER — BENZONATATE 100 MG PO CAPS
100.0000 mg | ORAL_CAPSULE | Freq: Three times a day (TID) | ORAL | Status: DC
  Administered 2019-02-02 – 2019-02-04 (×7): 100 mg via ORAL
  Filled 2019-02-02 (×7): qty 1

## 2019-02-02 MED ORDER — CARBIDOPA-LEVODOPA 25-100 MG PO TABS
1.0000 | ORAL_TABLET | Freq: Four times a day (QID) | ORAL | Status: DC
  Administered 2019-02-02 – 2019-02-04 (×11): 1 via ORAL
  Filled 2019-02-02 (×12): qty 1

## 2019-02-02 NOTE — Progress Notes (Signed)
Call and updated patient's son about the plan of care. Patient does need to be re-evaluated by PT because patient is very weak, will re-evaluate tomorrow. Patient's son gave cell number [phone]

## 2019-02-02 NOTE — Progress Notes (Signed)
Inpatient Diabetes Program Recommendations  AACE/ADA: New Consensus Statement on Inpatient Glycemic Control   Target Ranges:  Prepandial:   less than 140 mg/dL      Peak postprandial:   less than 180 mg/dL (1-2 hours)      Critically ill patients:  140 - 180 mg/dL  Results for Sean Armstrong, Sean Armstrong (MRN [id number]) as of 02/02/2019 09:34  Ref. Range 02/02/2019 03:32 02/02/2019 08:03  Glucose-Capillary Latest Ref Range: 70 - 99 mg/dL 185 (H) 141 (H)   Results for Sean Armstrong, Sean Armstrong (MRN [id number]) as of 02/02/2019 09:34  Ref. Range 02/01/2019 10:01 02/01/2019 10:23 02/01/2019 10:52 02/01/2019 11:03 02/01/2019 11:44 02/01/2019 13:12 02/01/2019 14:05 02/01/2019 15:48 02/01/2019 16:28 02/01/2019 17:25 02/01/2019 18:58 02/01/2019 20:30 02/01/2019 23:28  Glucose-Capillary Latest Ref Range: 70 - 99 mg/dL 70 46 (L) 47 (L) 46 (L) 134 (H) 101 (H) 95 67 (L) 88 111 (H) 120 (H) 126 (H) 177 (H)  Results for Sean Armstrong, Sean Armstrong (MRN [id number]) as of 02/02/2019 09:34  Ref. Range 02/01/2019 10:08  Hemoglobin A1C Latest Ref Range: 4.8 - 5.6 % 7.2 (H)   Review of Glycemic Control  Diabetes history: DM2 Outpatient Diabetes medications: Amaryl 2 mg daily Current orders for Inpatient glycemic control: CBGs Q4H; Prednisone 5 mg QAM  Inpatient Diabetes Program Recommendations:   Oral DM medications: Noted patient admitted with hypoglycemia likely from Amaryl. Due to patient's age and hypoglycemia, recommend discontinuing Amaryl as outpatient DM medication and ordering Tradjenta 5 mg daily (has very low risk of hypoglycemia) for DM control and patient patient follow up with PCP.  Thanks, Barnie Alderman, RN, MSN, CDE Diabetes Coordinator Inpatient Diabetes Program [phone] (Team Pager from 8am to 5pm)

## 2019-02-02 NOTE — Progress Notes (Signed)
Patient's vitals stable, with 02 sats of 98-100% on room air. Pt has no complaints of SOB, but is complaining of a new cough. MD notified. Patient's blood glucose trending up this shift. 12 pm glucose check was 227. MD made aware, and fluids with dextrose were discontinued at that time.  This nurse updated patient's son on the plan of care and patient condition this shift. Will continue to monitor.

## 2019-02-02 NOTE — Progress Notes (Signed)
[place] at [place] NAME: Sean Armstrong    MR#:  [id number]  DATE OF BIRTH:  [birth year]  SUBJECTIVE:  patient complains of dry cough. Very weak. He did try to eat some lunch earlier. Very hard on hearing. No family in the room.  REVIEW OF SYSTEMS:   Review of Systems  Constitutional: Negative for chills, fever and weight loss.  HENT: Negative for ear discharge, ear pain and nosebleeds.   Eyes: Negative for blurred vision, pain and discharge.  Respiratory: Positive for cough and shortness of breath. Negative for sputum production, wheezing and stridor.   Cardiovascular: Negative for chest pain, palpitations, orthopnea and PND.  Gastrointestinal: Negative for abdominal pain, diarrhea, nausea and vomiting.  Genitourinary: Negative for frequency and urgency.  Musculoskeletal: Negative for back pain and joint pain.  Neurological: Positive for weakness. Negative for sensory change, speech change and focal weakness.  Psychiatric/Behavioral: Negative for depression and hallucinations. The patient is not nervous/anxious.    Tolerating Diet: yes Tolerating PT:   DRUG ALLERGIES:   Allergies  Allergen Reactions  . Gentamycin [Gentamicin] Other (See Comments)    Reaction:  Caused kidney issues for pt   . Iodinated Diagnostic Agents     Other reaction(s): Other (See Comments) "Burning sensation throughout whole body"  . Meloxicam     Other reaction(s): Other (See Comments) GI UPSET  . Metrizamide     Other reaction(s): Other (See Comments) "Burning sensation throughout whole body"  . Penicillin V Potassium   . Celecoxib Nausea Only    Other reaction(s): Other (See Comments) GI upset  . Ciprofloxacin Nausea Only  . Hydrocodone Nausea Only  . Propranolol Palpitations    VITALS:  Blood pressure 120/89, pulse 62, temperature 99 F (37.2 C), temperature source Oral, resp. rate 20, height 5\' 8"  (1.727 m), weight 81 kg, SpO2 99 %.  PHYSICAL  EXAMINATION:   Physical Exam  GENERAL:  78 y.o.-year-old patient lying in the bed with no acute distress. Overall deconditioned EYES: Pupils equal, round, reactive to light and accommodation. No scleral icterus. Extraocular muscles intact.  HEENT: Head atraumatic, normocephalic. Oropharynx and nasopharynx clear.  NECK:  Supple, no jugular venous distention. No thyroid enlargement, no tenderness.  LUNGS: Normal breath sounds bilaterally, no wheezing, rales, mild course rhonchi. No use of accessory muscles of respiration.  CARDIOVASCULAR: S1, S2 normal. No murmurs, rubs, or gallops.  ABDOMEN: Soft, nontender, nondistended. Bowel sounds present. No organomegaly or mass.  EXTREMITIES: No cyanosis, clubbing or edema b/l.    NEUROLOGIC: Cranial nerves II through XII are intact. No focal Motor or sensory deficits b/l.   PSYCHIATRIC:  patient is alert and oriented x 3.  SKIN: bruises in upper extremity  LABORATORY PANEL:  CBC Recent Labs  Lab 02/01/19 1008  WBC 5.1  HGB 9.1*  HCT 30.2*  PLT 197    Chemistries  Recent Labs  Lab 02/01/19 1008  NA 139  K 3.8  CL 109  CO2 22  GLUCOSE 60*  BUN 21  CREATININE 1.21  CALCIUM 9.3  AST 29  ALT 14  ALKPHOS 42  BILITOT 0.6   Cardiac Enzymes No results for input(s): TROPONINI in the last 168 hours. RADIOLOGY:  Dg Chest 1 View  Result Date: 02/01/2019 CLINICAL DATA:  Weakness.  Hypoglycemia. EXAM: CHEST  1 VIEW COMPARISON:  01/10/2018 FINDINGS: Stable borderline heart size. Stable vascular pedicle widening. Coronary stents are noted along the left heart border. History of multiple falls. No  evidence of acute fracture. Remote lateral right eighth rib fracture. Probable trace right pleural effusion. No air bronchogram, edema, or pneumothorax. IMPRESSION: Possible trace right pleural effusion. Electronically Signed   By: Monte Fantasia M.D.   On: 02/01/2019 11:24   ASSESSMENT AND PLAN:  Sean Armstrong is a 78 y.o. male with a history of  anemia, asthma, diabetes, hypertension, hyperlipidemia, hypothyroidism, Parkinson's disease, renal insufficiency, rheumatoid arthritis who presents to the ED for hypoglycemia.  1. Hypoglycemia with history of type II diabetes -patient received D5 normal saline -sugars normalized. Patient is able to take oral diet -continue acute check AC  and HS -hold oral diabetes meds -patient has been off insulin for more than a year -after stopping IV dextrose I will see how that sugars remain and consider resuming low-dose oral diabetes meds  2. COVID 19 positive -patient has cough -PRN Tesalon pearls and Robitussin -chest x-ray negative for infiltrate -oxygen saturation hundred percent on room air  3. Generalized deconditioning with weakness -physical therapy to see patient -no recent fall according to the son  4. H/o  atrial fibrillation continue atenolol -on PO eliquis  5. Parkinson's disease -continue sinemet  6. DVT prophylaxis eliquis  Family communication: spoke with son Sean Armstrong on the phone Consults: none Discharge Disposition home with home health CODE STATUS: full DVT Prophylaxis:  eliquis  TOTAL TIME TAKING CARE OF THIS PATIENT: *25* minutes.  >50% time spent on counselling and coordination of care  POSSIBLE D/C IN **one* DAYS, DEPENDING ON CLINICAL CONDITION.  Note: This dictation was prepared with Dragon dictation along with smaller phrase technology. Any transcriptional errors that result from this process are unintentional.  Fritzi Mandes M.D on 02/02/2019 at 4:49 PM  Between 7am to 6pm - Pager - [phone]  After 6pm go to [email or web address]  [facility]   CC: Primary care physician; Albina Billet, MDPatient ID: Sean Armstrong, male   DOB: 30-Aug-1940, 78 y.o.   MRN: [id number]

## 2019-02-03 LAB — GLUCOSE, CAPILLARY
Glucose-Capillary: 107 mg/dL — ABNORMAL HIGH (ref 70–99)
Glucose-Capillary: 131 mg/dL — ABNORMAL HIGH (ref 70–99)
Glucose-Capillary: 164 mg/dL — ABNORMAL HIGH (ref 70–99)
Glucose-Capillary: 156 mg/dL — ABNORMAL HIGH (ref 70–99)

## 2019-02-03 MED ORDER — ACETAMINOPHEN 325 MG PO TABS
650.0000 mg | ORAL_TABLET | Freq: Four times a day (QID) | ORAL | Status: DC | PRN
  Administered 2019-02-04: 650 mg via ORAL
  Filled 2019-02-03: qty 2

## 2019-02-03 MED ORDER — ACETAMINOPHEN 500 MG PO TABS
1000.0000 mg | ORAL_TABLET | Freq: Once | ORAL | Status: AC
  Administered 2019-02-03: 1000 mg via ORAL
  Filled 2019-02-03: qty 2

## 2019-02-03 NOTE — Evaluation (Signed)
Physical Therapy Evaluation Patient Details Name: Sean Armstrong MRN: [id number] DOB: Mar 19, 1940 Today's Date: 02/03/2019   History of Present Illness  presented to ER secondary to hypoglycemia, decreased responsiveness; admitted for management of hypoglycemia.  Noted with positive COVID testing (02/01/19) upon admission.  Clinical Impression  Upon evaluation, patient alert and oriented; follows commands and participates with session. Patient globally weak and deconditioned throughout all extremities; but able to support body weight in LEs without buckling or overt LOB.  Displays limited lumbar/pelvic ROM, maintaining lumbar flex/posterior pelvic tilt with all sitting/standing postures and resulting in decreased eccentric control with stand to sit during transfer attempts.  Currently requiring mod assist for bed mobility; min/mod assist for sit/stand, basic transfers with RW; min assist for gait (15') with RW.  Demonstrates shuffling gait performance with forward flexed posture, min/mod WBing bilat UEs; limited balance reactions with decreased cadence/gait speed. Unsafe to attempt without RW and +1 at all times.  Additional distance limited by fatigue. Of note, vitals stable and WFL on RA; no orthostasis noted with position change, no desat with activity (>91-92% on RA). Would benefit from skilled PT to address above deficits and promote optimal return to PLOF.; recommend transition to STR upon discharge from acute hospitalization.      Follow Up Recommendations SNF    Equipment Recommendations       Recommendations for Other Services       Precautions / Restrictions Precautions Precautions: Fall Restrictions Weight Bearing Restrictions: No      Mobility  Bed Mobility Overal bed mobility: Needs Assistance Bed Mobility: Supine to Sit     Supine to sit: Mod assist;Min assist     General bed mobility comments: assist for truncal elevation  Transfers Overall transfer level:  Needs assistance   Transfers: Sit to/from Stand Sit to Stand: Min assist;Mod assist         General transfer comment: assist for lift off, anterior weight translation and overall balance; maintains some degree of lumbar flexion, posterior pelvic tilt with all sitting/standing postures  Ambulation/Gait Ambulation/Gait assistance: Min assist Gait Distance (Feet): 15 Feet Assistive device: Rolling walker (2 wheeled)       General Gait Details: shuffling gait performance with forward flexed posture, min/mod WBing bilat UEs; limited balance reactions with decreased cadence/gait speed. Unsafe to attempt without RW and +1 at all times.  Additional distance limited by fatigue.  Stairs            Wheelchair Mobility    Modified Rankin (Stroke Patients Only)       Balance Overall balance assessment: Needs assistance Sitting-balance support: No upper extremity supported;Feet supported Sitting balance-Leahy Scale: Good     Standing balance support: Bilateral upper extremity supported Standing balance-Leahy Scale: Fair                               Pertinent Vitals/Pain Pain Assessment: No/denies pain    Home Living Family/patient expects to be discharged to:: Private residence   Available Help at Discharge: Family;Available 24 hours/day Type of Home: House Home Access: Ramped entrance     Home Layout: One level Home Equipment: Walker - 2 wheels      Prior Function           Comments: Per patient report, wife/family assist with mobility as needed; consistently utilizes RW for all mobility.     Hand Dominance        Extremity/Trunk Assessment  Upper Extremity Assessment Upper Extremity Assessment: Generalized weakness    Lower Extremity Assessment Lower Extremity Assessment: Generalized weakness(grossly 4-/5 throughout)       Communication   Communication: HOH  Cognition Arousal/Alertness: Awake/alert Behavior During Therapy: WFL  for tasks assessed/performed Overall Cognitive Status: Within Functional Limits for tasks assessed                                        General Comments      Exercises     Assessment/Plan    PT Assessment Patient needs continued PT services  PT Problem List Decreased strength;Decreased activity tolerance;Decreased balance;Decreased mobility;Decreased coordination;Decreased knowledge of use of DME;Decreased safety awareness;Decreased knowledge of precautions;Impaired sensation;Cardiopulmonary status limiting activity       PT Treatment Interventions DME instruction;Gait training;Functional mobility training;Therapeutic activities;Therapeutic exercise;Balance training;Patient/family education    PT Goals (Current goals can be found in the Care Plan section)  Acute Rehab PT Goals Patient Stated Goal: to get rid of this cold PT Goal Formulation: With patient Time For Goal Achievement: 02/17/19 Potential to Achieve Goals: Fair    Frequency Min 2X/week   Barriers to discharge Decreased caregiver support      Co-evaluation               AM-PAC PT "6 Clicks" Mobility  Outcome Measure Help needed turning from your back to your side while in a flat bed without using bedrails?: A Little Help needed moving from lying on your back to sitting on the side of a flat bed without using bedrails?: A Lot Help needed moving to and from a bed to a chair (including a wheelchair)?: A Lot Help needed standing up from a chair using your arms (e.g., wheelchair or bedside chair)?: A Lot Help needed to walk in hospital room?: A Little Help needed climbing 3-5 steps with a railing? : A Lot 6 Click Score: 14    End of Session Equipment Utilized During Treatment: Gait belt Activity Tolerance: Patient limited by fatigue Patient left: in chair;with call bell/phone within reach;with chair alarm set Nurse Communication: Mobility status PT Visit Diagnosis: Difficulty in walking,  not elsewhere classified (R26.2);Muscle weakness (generalized) (M62.81)    Time: [id number] PT Time Calculation (min) (ACUTE ONLY): 34 min   Charges:   PT Evaluation $PT Eval Moderate Complexity: 1 Mod PT Treatments $Therapeutic Activity: 8-22 mins       Stefanny Pieri H. Owens Shark, PT, DPT, NCS 02/03/19, 1:30 PM [phone]

## 2019-02-03 NOTE — Progress Notes (Signed)
[place] at [place] NAME: Sean Armstrong    MR#:  [id number]  DATE OF BIRTH:  Aug 30, 1940  SUBJECTIVE:  patient complains of dry cough. Very weak. He did try to eat some lunch earlier. Very hard on hearing. No family in the room. Out in the chair  REVIEW OF SYSTEMS:   Review of Systems  Constitutional: Negative for chills, fever and weight loss.  HENT: Negative for ear discharge, ear pain and nosebleeds.   Eyes: Negative for blurred vision, pain and discharge.  Respiratory: Positive for cough and shortness of breath. Negative for sputum production, wheezing and stridor.   Cardiovascular: Negative for chest pain, palpitations, orthopnea and PND.  Gastrointestinal: Negative for abdominal pain, diarrhea, nausea and vomiting.  Genitourinary: Negative for frequency and urgency.  Musculoskeletal: Negative for back pain and joint pain.  Neurological: Positive for weakness. Negative for sensory change, speech change and focal weakness.  Psychiatric/Behavioral: Negative for depression and hallucinations. The patient is not nervous/anxious.    Tolerating Diet: yes Tolerating PT: STR  DRUG ALLERGIES:   Allergies  Allergen Reactions  . Gentamycin [Gentamicin] Other (See Comments)    Reaction:  Caused kidney issues for pt   . Iodinated Diagnostic Agents     Other reaction(s): Other (See Comments) "Burning sensation throughout whole body"  . Meloxicam     Other reaction(s): Other (See Comments) GI UPSET  . Metrizamide     Other reaction(s): Other (See Comments) "Burning sensation throughout whole body"  . Penicillin V Potassium   . Celecoxib Nausea Only    Other reaction(s): Other (See Comments) GI upset  . Ciprofloxacin Nausea Only  . Hydrocodone Nausea Only  . Propranolol Palpitations    VITALS:  Blood pressure 116/66, pulse 63, temperature 98.4 F (36.9 C), temperature source Oral, resp. rate 18, height 5\' 8"  (1.727 m), weight 82.1  kg, SpO2 96 %.  PHYSICAL EXAMINATION:   Physical Exam  GENERAL:  78 y.o.-year-old patient lying in the bed with no acute distress. Overall deconditioned EYES: Pupils equal, round, reactive to light and accommodation. No scleral icterus. Extraocular muscles intact.  HEENT: Head atraumatic, normocephalic. Oropharynx and nasopharynx clear.  NECK:  Supple, no jugular venous distention. No thyroid enlargement, no tenderness.  LUNGS: Normal breath sounds bilaterally, no wheezing, rales, mild course rhonchi. No use of accessory muscles of respiration.  CARDIOVASCULAR: S1, S2 normal. No murmurs, rubs, or gallops.  ABDOMEN: Soft, nontender, nondistended. Bowel sounds present. No organomegaly or mass.  EXTREMITIES: No cyanosis, clubbing or edema b/l.    NEUROLOGIC: Cranial nerves II through XII are intact. No focal Motor or sensory deficits b/l.   PSYCHIATRIC:  patient is alert and oriented x 3.  SKIN: bruises in upper extremity  LABORATORY PANEL:  CBC Recent Labs  Lab 02/01/19 1008  WBC 5.1  HGB 9.1*  HCT 30.2*  PLT 197    Chemistries  Recent Labs  Lab 02/01/19 1008  NA 139  K 3.8  CL 109  CO2 22  GLUCOSE 60*  BUN 21  CREATININE 1.21  CALCIUM 9.3  AST 29  ALT 14  ALKPHOS 42  BILITOT 0.6   Cardiac Enzymes No results for input(s): TROPONINI in the last 168 hours. RADIOLOGY:  No results found. ASSESSMENT AND PLAN:  Sean Armstrong is a 78 y.o. male with a history of anemia, asthma, diabetes, hypertension, hyperlipidemia, hypothyroidism, Parkinson's disease, renal insufficiency, rheumatoid arthritis who presents to the ED for hypoglycemia.  1. Hypoglycemia  with history of type II diabetes -patient received D5 normal saline -sugars normalized. Patient is able to take oral diet -continue acute check AC  and HS -hold oral diabetes meds -patient has been off insulin for more than a year -after stopping IV dextrose I will see how that sugars remain and consider resuming  low-dose oral diabetes meds -sugars much better.  2. COVID 19 positive -patient has cough -PRN Tesalon pearls and Robitussin -chest x-ray negative for infiltrate -oxygen saturation 100 percent on room air  3. Generalized deconditioning with weakness -physical therapy to see patient-recommends rehab -no recent fall according to the son  4. H/o  atrial fibrillation continue atenolol -on PO eliquis  5. Parkinson's disease -continue sinemet  6. DVT prophylaxis eliquis    Family communication: spoke with son Sean Armstrong on the phone Consults: none Discharge Disposition home with home health vs STR (dependng on aviability of COVID + rehab bed) CODE STATUS: full DVT Prophylaxis:  eliquis  TOTAL TIME TAKING CARE OF THIS PATIENT: *25* minutes.  >50% time spent on counselling and coordination of care   Note: This dictation was prepared with Dragon dictation along with smaller phrase technology. Any transcriptional errors that result from this process are unintentional.  Fritzi Mandes M.D on 02/03/2019 at 2:08 PM  Between 7am to 6pm - Pager - [phone]  After 6pm go to [email or web address]  [facility]   CC: Primary care physician; Albina Billet, MDPatient ID: Sean Armstrong, male   DOB: 1941-01-13, 78 y.o.   MRN: [id number]

## 2019-02-03 NOTE — TOC Progression Note (Addendum)
Transition of Care [facility]) - Progression Note    Patient Details  Name: Sean Armstrong MRN: [id number] Date of Birth: [birth year]  [facility] CM/SW Contact  [name], [place] Phone Number: 02/03/2019, 3:35 PM  Clinical Narrative:    CSW spoke to patient's son Sean Armstrong, [phone] and presented bed offers.  [facility] accepted patient and should be able to accept him tomorrow, as long as their Covid + room availability does not change.  CSW updated bedside nurse and physician.  4:20pm  CSW received phone call from patient's son, he asked if [facility] in [place], [place], or [facility] can accept Covid+ patients, CSW contacted facilities and they said no they can not accept Covid + patients.  CSW updated patient's son, and informed him the only two options are [facility] in [place], or [facility], and he agreed again to [facility].  CSW contacted [facility] to let her know, she said they can accept patient on Friday or over the weekend if he is medically ready for discharge as long as her bed availability does not change.  CSW to continue to follow patient's progress throughout discharge planning.  Expected Discharge Plan: Skilled nursing facility  Expected Discharge Plan and Services   Expected Discharge Plan: skilled nursing facility     Living arrangements for the past 2 months: Single Family Home                                       Social Determinants of Health (SDOH) Interventions    Readmission Risk Interventions No flowsheet data found.

## 2019-02-03 NOTE — NC FL2 (Signed)
[facility] LEVEL OF CARE SCREENING TOOL     IDENTIFICATION  Patient Name: Sean Armstrong Birthdate: 03/25/1940 Sex: male Admission Date (Current Location): 02/01/2019  [place] and [place] Number:  Engineering geologist and Address:  [facility], [address] [place], [place] [address]      Provider Number: [id number]  Attending Physician Name and Address:  Fritzi Mandes, MD  Relative Name and Phone Number:  Jediah, Gallis [phone] and Kasai, [place] [phone]    Current Level of Care: Hospital Recommended Level of Care: [place] Prior Approval Number:    Date Approved/Denied:   PASRR Number: [id number]  Discharge Plan: SNF    Current Diagnoses: Patient Active Problem List   Diagnosis Date Noted  . Hypoglycemia 02/01/2019  . Hypothyroidism   . Hypertension   . Diabetes mellitus without complication ([place])   . Coronary artery disease   . Carotid stenosis 02/25/2018  . AAA (abdominal aortic aneurysm) without rupture ([place]) 09/29/2016  . PAD (peripheral artery disease) ([place]) 09/29/2016  . Rib fracture 08/08/2016  . Constipation 08/08/2016  . Deep vein thrombosis (DVT) of popliteal vein of right lower extremity ([place]) 06/18/2016  . Left leg weakness 12/05/2015  . UTI (lower urinary tract infection) 12/05/2015  . Diabetes ([place]) 12/05/2015  . Anxiety 12/05/2015  . GERD (gastroesophageal reflux disease) 12/05/2015  . HLD (hyperlipidemia) 12/05/2015  . BPH (benign prostatic hyperplasia) 12/05/2015  . A-fib ([place]) 12/05/2015  . Sepsis ([place]) 06/18/2015  . History of DVT (deep vein thrombosis) 2009    Orientation RESPIRATION BLADDER Height & Weight     Self  Normal Incontinent Weight: 181 lb (82.1 kg) Height:  5\' 8"  (172.7 cm)  BEHAVIORAL SYMPTOMS/MOOD NEUROLOGICAL BOWEL NUTRITION STATUS      Incontinent Diet(Soft diet)  AMBULATORY STATUS COMMUNICATION OF NEEDS Skin   Limited Assist Verbally  Normal                       Personal Care Assistance Level of Assistance  Bathing, Dressing, Feeding Bathing Assistance: Limited assistance Feeding assistance: Limited assistance Dressing Assistance: Limited assistance     Functional Limitations Info  Sight, Speech, Hearing Sight Info: Adequate Hearing Info: Adequate Speech Info: Adequate    SPECIAL CARE FACTORS FREQUENCY  PT (By licensed PT), OT (By licensed OT)     PT Frequency: Minimum 5x a week OT Frequency: Minimum 5x a week            Contractures Contractures Info: Not present    Additional Factors Info  Code Status, Allergies, Isolation Precautions Code Status Info: Full Code Allergies Info: Gentamycin, Iodinated Diagnostic Agents Meloxicam Metrizamide Penicillin V Potassium Celecoxib Ciprofloxacin Hydrocodone Propranolol     Isolation Precautions Info: Air/contact precautions     Current Medications (02/03/2019):  This is the current hospital active medication list Current Facility-Administered Medications  Medication Dose Route Frequency Provider Last Rate Last Dose  . acetaminophen (TYLENOL) tablet 650 mg  650 mg Oral Q6H PRN Bodenheimer, Charles A, NP      . apixaban (ELIQUIS) tablet 5 mg  5 mg Oral BID Lu Duffel, RPH   5 mg at 02/03/19 0919  . atenolol (TENORMIN) tablet 12.5 mg  12.5 mg Oral Daily Lu Duffel, RPH   12.5 mg at 02/03/19 0920  . atorvastatin (LIPITOR) tablet 80 mg  80 mg Oral Daily Fritzi Mandes, MD   80 mg at 02/02/19 2029  . benzonatate (TESSALON) capsule 100  mg  100 mg Oral TID Fritzi Mandes, MD   100 mg at 02/03/19 0919  . carbidopa-levodopa (SINEMET IR) 25-100 MG per tablet immediate release 1 tablet  1 tablet Oral QID Lu Duffel, [facility]   1 tablet at 02/03/19 1233  . gabapentin (NEURONTIN) capsule 100 mg  100 mg Oral Daily Lu Duffel, RPH   100 mg at 02/03/19 [id number]  . guaiFENesin (ROBITUSSIN) 100 MG/5ML solution 100 mg  5 mL Oral Q4H PRN Fritzi Mandes, MD      . hydroxychloroquine (PLAQUENIL) tablet 200 mg  200 mg Oral BID Lu Duffel, RPH   200 mg at 02/03/19 0920  . loratadine (CLARITIN) tablet 10 mg  10 mg Oral Daily Lu Duffel, [place]   10 mg at 02/03/19 0919  . oxyCODONE (Oxy IR/ROXICODONE) immediate release tablet 5 mg  5 mg Oral Q4H PRN Para Skeans, MD   5 mg at 02/02/19 2033  . polyvinyl alcohol (LIQUIFILM TEARS) 1.4 % ophthalmic solution 1 drop  1 drop Both Eyes TID PRN Para Skeans, MD      . predniSONE (DELTASONE) tablet 5 mg  5 mg Oral Q breakfast Lu Duffel, [place]   5 mg at 02/03/19 0919  . sucralfate (CARAFATE) tablet 1 g  1 g Oral BID Fritzi Mandes, MD   1 g at 02/03/19 0918  . tamsulosin (FLOMAX) capsule 0.4 mg  0.4 mg Oral QPC breakfast Lu Duffel, RPH   0.4 mg at 02/03/19 [id number]  . traZODone (DESYREL) tablet 75 mg  75 mg Oral QHS Lu Duffel, [place]   75 mg at 02/02/19 2029     Discharge Medications: Please see discharge summary for a list of discharge medications.  Relevant Imaging Results:  Relevant Lab Results:   Additional Information SSN  [id number] Patient is covid positive.  [name], LCSW

## 2019-02-04 LAB — CBC
HCT: 24.6 % — ABNORMAL LOW (ref 39.0–52.0)
Hemoglobin: 7.8 g/dL — ABNORMAL LOW (ref 13.0–17.0)
MCH: 24.2 pg — ABNORMAL LOW (ref 26.0–34.0)
MCHC: 31.7 g/dL (ref 30.0–36.0)
MCV: 76.4 fL — ABNORMAL LOW (ref 80.0–100.0)
Platelets: 132 10*3/uL — ABNORMAL LOW (ref 150–400)
RBC: 3.22 MIL/uL — ABNORMAL LOW (ref 4.22–5.81)
RDW: 16.2 % — ABNORMAL HIGH (ref 11.5–15.5)
WBC: 3.7 10*3/uL — ABNORMAL LOW (ref 4.0–10.5)
nRBC: 0 % (ref 0.0–0.2)

## 2019-02-04 LAB — BASIC METABOLIC PANEL
Anion gap: 10 (ref 5–15)
BUN: 28 mg/dL — ABNORMAL HIGH (ref 8–23)
CO2: 22 mmol/L (ref 22–32)
Calcium: 8.7 mg/dL — ABNORMAL LOW (ref 8.9–10.3)
Chloride: 104 mmol/L (ref 98–111)
Creatinine, Ser: 1.34 mg/dL — ABNORMAL HIGH (ref 0.61–1.24)
GFR calc Af Amer: 58 mL/min — ABNORMAL LOW (ref >60)
GFR calc non Af Amer: 50 mL/min — ABNORMAL LOW (ref >60)
Glucose, Bld: 83 mg/dL (ref 70–99)
Potassium: 3.5 mmol/L (ref 3.5–5.1)
Sodium: 136 mmol/L (ref 135–145)

## 2019-02-04 LAB — GLUCOSE, CAPILLARY
Glucose-Capillary: 73 mg/dL (ref 70–99)
Glucose-Capillary: 154 mg/dL — ABNORMAL HIGH (ref 70–99)

## 2019-02-04 MED ORDER — GUAIFENESIN 100 MG/5ML PO SOLN: 5.0000 mL | ORAL | 0 refills | Status: AC | PRN

## 2019-02-04 MED ORDER — BENZONATATE 100 MG PO CAPS: 100.0000 mg | ORAL_CAPSULE | Freq: Three times a day (TID) | ORAL | 0 refills | Status: AC

## 2019-02-04 MED ORDER — OXYCODONE-ACETAMINOPHEN 5-325 MG PO TABS: 1.0000 | ORAL_TABLET | Freq: Four times a day (QID) | ORAL | 0 refills | Status: AC | PRN

## 2019-02-04 NOTE — Plan of Care (Signed)
Pt ready for discharge to SNF: [facility].  Report called to Buffy, LPN. Awaiting EMS non-emergent transfer.

## 2019-02-04 NOTE — Care Management Important Message (Signed)
Important Message  Patient Details  Name: Sean Armstrong MRN: [id number] Date of Birth: [birth year]   Medicare Important Message Given:  Other (see comment)(RN will give to him next time she goes in the room.)     Candie Chroman, LCSW 02/04/2019, 2:24 PM

## 2019-02-04 NOTE — Progress Notes (Signed)
EMS is here to pick up patient to go to [facility]. Darlene RN called report. I also called patient's family to let them know he got discharge. Discontinue PIV and telemetry monitor.

## 2019-02-04 NOTE — TOC Transition Note (Signed)
Transition of Care [facility]) - CM/SW Discharge Note   Patient Details  Name: Sean Armstrong MRN: [id number] Date of Birth: 05/24/1940  Transition of Care [facility]) CM/SW Contact:  Candie Chroman, LCSW Phone Number: 02/04/2019, 3:59 PM   Clinical Narrative: Patient has orders to discharge to [facility] today. RN has already called report. She will call EMS. No further concerns. CSW signing off.  Final next level of care: [place] Barriers to Discharge: Barriers Resolved   Patient Goals and CMS Choice Patient states their goals for this hospitalization and ongoing recovery are:: Home with family      Discharge Placement   Existing PASRR number confirmed : 02/03/19          Patient chooses bed at: [facility] Patient to be transferred to facility by: EMS Name of family member notified: RN called son Christia Reading. CSW notified his wife. Patient and family notified of of transfer: 02/04/19  Discharge Plan and Services                                     Social Determinants of Health (SDOH) Interventions     Readmission Risk Interventions No flowsheet data found.

## 2019-02-04 NOTE — TOC Progression Note (Addendum)
Transition of Care [facility]) - Progression Note    Patient Details  Name: KAIEA LEWEY MRN: [id number] Date of Birth: 1941-01-19  Transition of Care [facility]) CM/SW [place], LCSW Phone Number: 02/04/2019, 12:30 PM  Clinical Narrative: [facility] admissions coordinator waiting to hear from her DON on whether patient can discharge there today or not.   2:10 pm: [facility] can accept patient today. MD is aware.   Expected Discharge Plan: [place]    Expected Discharge Plan and Services Expected Discharge Plan: [place]       Living arrangements for the past 2 months: Single Family Home                                       Social Determinants of Health (SDOH) Interventions    Readmission Risk Interventions No flowsheet data found.

## 2019-02-04 NOTE — Progress Notes (Addendum)
Pt was feeding himself when he began to choke on a mouthful of food. Pt turned dark red, and indicated that he could not breathe. Back slaps provided as I was unable to position pt for [place] manouver. Airway cleared of large pieces of partially chewed food. Pt has congested sounding cough. MD notified, and CXR ordered.  1415--MD decided not to perform CXR due to pt's COVID+ status.

## 2019-02-04 NOTE — Discharge Summary (Signed)
[place] [facility] [place] NAME: Sean Armstrong    MR#:  [id number]  DATE OF BIRTH:  02-Dec-1940  DATE OF ADMISSION:  02/01/2019 ADMITTING PHYSICIAN: Para Skeans, MD  DATE OF DISCHARGE:02/04/2019  PRIMARY CARE PHYSICIAN: Albina Billet, MD    ADMISSION DIAGNOSIS:  Hypoglycemia [E16.2]  DISCHARGE DIAGNOSIS:  Hypoglycemia--resolved--pt off Diabetes meds for now COVID-19 positive SECONDARY DIAGNOSIS:   Past Medical History:  Diagnosis Date  . Anemia   . Asthma   . Colon polyp   . Coronary artery disease   . Diabetes mellitus without complication ([place])   . Dysrhythmia   . Falls frequently   . FHx: SVT (supraventricular tachycardia) 1998  . GERD (gastroesophageal reflux disease)   . History of DVT (deep vein thrombosis) 2009  . History of kidney stones   . History of pulmonary embolism 08/2008  . Hyperlipidemia   . Hypertension   . Hypothyroidism   . Lumbar disc disease   . MGUS (monoclonal gammopathy of unknown significance)   . Myocardial infarction ([place])    1995  . Nephrolithiasis   . Osteoarthritis   . Parkinson's disease ([place])   . Peptic ulcer disease   . Renal insufficiency   . Rheumatoid arthritis [facility])     HOSPITAL COURSE:  Sean Cloos Wilkinsis a 78 y.o.malewith a history of anemia, asthma, diabetes, hypertension, hyperlipidemia, hypothyroidism, Parkinson's disease, renal insufficiency, rheumatoid arthritiswho presents to the ED for hypoglycemia.  1. Hypoglycemia with history of type II diabetes -patient received D5 normal saline -sugars normalized. Patient is able to take oral diet -continue acute check AC  and HS -hold oral diabetes meds for now -patient has been off insulin for more than a year -sugars much better. -Follow-up PCP after discharge from rehab -please monitor sugars at the rehab facility  2. COVID 19 positive -patient has cough -PRN Tesalon pearls and Robitussin -chest x-ray negative for  infiltrate -oxygen saturation 100 percent on room air -mild pancytopenia suspect due to viral illness-- follow-up CBC in one week  3. Generalized deconditioning with weakness -physical therapy to see patient-recommends rehab -no recent fall according to the son -patient has bed availability at [facility].  4. H/o  atrial fibrillation continue atenolol -on PO eliquis  5. Parkinson's disease -continue sinemet  6. DVT prophylaxis eliquis  patient had discharge today to rehab. Son aware.  Family communication: spoke with son Nakita Apo on the phone Consults: none [facility]charge [facility]position [facility]  CODE STATUS: full DVT Prophylaxis:  eliquis CONSULTS OBTAINED:    DRUG ALLERGIES:   Allergies  Allergen Reactions  . Gentamycin [Gentamicin] Other (See Comments)    Reaction:  Caused kidney issues for pt   . Iodinated Diagnostic Agents     Other reaction(s): Other (See Comments) "Burning sensation throughout whole body"  . Meloxicam     Other reaction(s): Other (See Comments) GI UPSET  . Metrizamide     Other reaction(s): Other (See Comments) "Burning sensation throughout whole body"  . Penicillin V Potassium   . Celecoxib Nausea Only    Other reaction(s): Other (See Comments) GI upset  . Ciprofloxacin Nausea Only  . Hydrocodone Nausea Only  . Propranolol Palpitations    DISCHARGE MEDICATIONS:   Allergies as of 02/04/2019      Reactions   Gentamycin [gentamicin] Other (See Comments)   Reaction:  Caused kidney issues for pt    Iodinated Diagnostic Agents    Other reaction(s): Other (See Comments) "Burning  sensation throughout whole body"   Meloxicam    Other reaction(s): Other (See Comments) GI UPSET   Metrizamide    Other reaction(s): Other (See Comments) "Burning sensation throughout whole body"   Penicillin V Potassium    Celecoxib Nausea Only   Other reaction(s): Other (See Comments) GI upset   Ciprofloxacin Nausea Only   Hydrocodone  Nausea Only   Propranolol Palpitations      Medication List    STOP taking these medications   glimepiride 2 MG tablet Commonly known as: AMARYL   loperamide 2 MG capsule Commonly known as: IMODIUM   oxyCODONE 5 MG immediate release tablet Commonly known as: Roxicodone   senna-docusate 8.6-50 MG tablet Commonly known as: Senokot-S   triamterene-hydrochlorothiazide 75-50 MG tablet Commonly known as: MAXZIDE     TAKE these medications   apixaban 5 MG Tabs tablet Commonly known as: ELIQUIS Take 1 tablet (5 mg total) by mouth 2 (two) times daily.   atenolol 25 MG tablet Commonly known as: TENORMIN Take 0.5 tablets (12.5 mg total) by mouth daily. What changed: how much to take   atorvastatin 80 MG tablet Commonly known as: Lipitor Take 1 tablet (80 mg total) by mouth daily at 6 PM.   benzonatate 100 MG capsule Commonly known as: TESSALON Take 1 capsule (100 mg total) by mouth 3 (three) times daily.   carbidopa-levodopa 25-100 MG tablet Commonly known as: SINEMET IR Take 1 tablet by mouth 4 (four) times daily.   CENTRUM SILVER PO Take 1 tablet by mouth daily.   cetirizine 10 MG tablet Commonly known as: ZYRTEC Take 10 mg by mouth daily.   cyanocobalamin 1000 MCG/ML injection Commonly known as: (VITAMIN B-12) Inject 1,000 mcg into the muscle every 30 (thirty) days.   fluticasone 50 MCG/ACT nasal spray Commonly known as: FLONASE Place 2 sprays into both nostrils daily.   gabapentin 100 MG capsule Commonly known as: NEURONTIN Take 100 mg by mouth daily.   guaiFENesin 100 MG/5ML Soln Commonly known as: ROBITUSSIN Take 5 mLs (100 mg total) by mouth every 4 (four) hours as needed for cough or to loosen phlegm.   hydroxychloroquine 200 MG tablet Commonly known as: PLAQUENIL Take 200 mg by mouth 2 (two) times daily.   hydroxypropyl methylcellulose / hypromellose 2.5 % ophthalmic solution Commonly known as: ISOPTO TEARS / GONIOVISC Place 1 drop into both  eyes 3 (three) times daily as needed for dry eyes.   nystatin-triamcinolone ointment Commonly known as: MYCOLOG Apply 1 application topically 2 (two) times daily.   oxyCODONE-acetaminophen 5-325 MG tablet Commonly known as: Percocet Take 1 tablet by mouth every 6 (six) hours as needed for moderate pain or severe pain.   polyethylene glycol powder 17 GM/SCOOP powder Commonly known as: GLYCOLAX/MIRALAX 2 cap fulls in a full glass of water, three times a day, for 5 days.   potassium chloride SA 20 MEQ tablet Commonly known as: KLOR-CON Take 20 mEq by mouth 2 (two) times daily.   predniSONE 5 MG tablet Commonly known as: DELTASONE Take 5 mg by mouth daily with breakfast.   sodium chloride 0.65 % Soln nasal spray Commonly known as: OCEAN Place 1 spray into both nostrils as needed for congestion.   sucralfate 1 g tablet Commonly known as: CARAFATE Take 1 g by mouth 2 (two) times daily.   tamsulosin 0.4 MG Caps capsule Commonly known as: FLOMAX Take 0.4 mg by mouth daily after breakfast.   traZODone 50 MG tablet Commonly known as: DESYREL Take 75  mg by mouth at bedtime.       If you experience worsening of your admission symptoms, develop shortness of breath, life threatening emergency, suicidal or homicidal thoughts you must seek medical attention immediately by calling 911 or calling your MD immediately  if symptoms less severe.  You Must read complete instructions/literature along with all the possible adverse reactions/side effects for all the Medicines you take and that have been prescribed to you. Take any new Medicines after you have completely understood and accept all the possible adverse reactions/side effects.   Please note  You were cared for by a hospitalist during your hospital stay. If you have any questions about your discharge medications or the care you received while you were in the hospital after you are discharged, you can call the unit and asked to speak  with the hospitalist on call if the hospitalist that took care of you is not available. Once you are discharged, your primary care physician will handle any further medical issues. Please note that NO REFILLS for any discharge medications will be authorized once you are discharged, as it is imperative that you return to your primary care physician (or establish a relationship with a primary care physician if you do not have one) for your aftercare needs so that they can reassess your need for medications and monitor your lab values. Today   SUBJECTIVE   Overall doing well. Appetite improving. Cough mild no respiratory distress at present  VITAL SIGNS:  Blood pressure 113/67, pulse 67, temperature 100.1 F (37.8 C), temperature source Oral, resp. rate 18, height 5\' 8"  (1.727 m), weight 81.3 kg, SpO2 94 %.  I/O:    Intake/Output Summary (Last 24 hours) at 02/04/2019 1446 Last data filed at 02/03/2019 2041 Gross per 24 hour  Intake -  Output 300 ml  Net -300 ml    PHYSICAL EXAMINATION:  GENERAL:  78 y.o.-year-old patient lying in the bed with no acute distress. Deconditioned,weak EYES: Pupils equal, round, reactive to light and accommodation. No scleral icterus.   HEENT: Head atraumatic, normocephalic. Oropharynx and nasopharynx clear.  NECK:  Supple, no jugular venous distention. No thyroid enlargement, no tenderness.  LUNGS: Normal breath sounds bilaterally, scattered wheezing,no rales,rhonchi or crepitation. No use of accessory muscles of respiration.  CARDIOVASCULAR: S1, S2 normal. No murmurs, rubs, or gallops.  ABDOMEN: Soft, non-tender, non-distended. Bowel sounds present. No organomegaly or mass.  EXTREMITIES: No pedal edema, cyanosis, or clubbing.  NEUROLOGIC: Cranial nerves II through XII are intact. No focal weakness. Sensation intact. Gait not checked.  PSYCHIATRIC: The patient is alert and oriented x 2.  SKIN: No obvious rash, lesion, or ulcer.   DATA REVIEW:   CBC   Recent Labs  Lab 02/04/19 0729  WBC 3.7*  HGB 7.8*  HCT 24.6*  PLT 132*    Chemistries  Recent Labs  Lab 02/01/19 1008 02/04/19 0729  NA 139 136  K 3.8 3.5  CL 109 104  CO2 22 22  GLUCOSE 60* 83  BUN 21 28*  CREATININE 1.21 1.34*  CALCIUM 9.3 8.7*  AST 29  --   ALT 14  --   ALKPHOS 42  --   BILITOT 0.6  --     Microbiology Results   Recent Results (from the past 240 hour(s))  SARS CORONAVIRUS 2 (TAT 6-24 HRS) Nasopharyngeal Nasopharyngeal Swab     Status: Abnormal   Collection Time: 02/01/19 12:46 PM   Specimen: Nasopharyngeal Swab  Result Value Ref Range Status  SARS Coronavirus 2 POSITIVE (A) NEGATIVE Final    Comment: (NOTE) SARS-CoV-2 target nucleic acids are DETECTED. The SARS-CoV-2 RNA is generally detectable in upper and lower respiratory specimens during the acute phase of infection. Positive results are indicative of active infection with SARS-CoV-2. Clinical  correlation with patient history and other diagnostic information is necessary to determine patient infection status. Positive results do  not rule out bacterial infection or co-infection with other viruses. The expected result is Negative. Fact Sheet for Patients: SugarRoll.be Fact Sheet for Healthcare Providers: https://www.woods-mathews.com/ This test is not yet approved or cleared by the [place] FDA and  has been authorized for detection and/or diagnosis of SARS-CoV-2 by FDA under an Emergency Use Authorization (EUA). This EUA will remain  in effect (meaning this test can be used) for the duration of the COVID-19 declaration under Section 564(b)(1) of the Act, 21 U.S.C.  section 360bbb-3(b)(1), unless the authorization is terminated or revoked sooner. Performed at [facility], [place] [address], [place], [place] [address]     RADIOLOGY:  No results found.   CODE STATUS:     Code Status Orders  (From admission, onward)          Start     Ordered   02/01/19 1233  Full code  Continuous     02/01/19 1233        Code Status History    Date Active Date Inactive Code Status Order ID Comments User Context   10/08/2018 0849 10/08/2018 1238 Full Code [id number]  Sharlotte Alamo, [facility] Inpatient   08/08/2016 2219 08/10/2016 1425 Full Code [id number]  Lance Coon, MD Inpatient   12/05/2015 2342 12/06/2015 1259 Full Code [id number]  Lance Coon, MD Inpatient   06/18/2015 1701 06/20/2015 1952 Full Code [id number]  Epifanio Lesches, MD ED   Advance Care Planning Activity     Family Discussion: spoke with son Taleb Carmical Consults: physical therapy   TOTAL TIME TAKING CARE OF THIS PATIENT: *40* minutes.    Fritzi Mandes M.D on 02/04/2019 at 2:46 PM  Between 7am to 6pm - Pager - [phone] After 6pm go to [email or web address] - password [email or web address]  [facility]    CC: Primary care physician; Albina Billet, MD

## 2019-02-04 NOTE — Progress Notes (Signed)
Spoke with pt's son, Narada Frees, regarding pt's status and imminent transfer to [facility].

## 2019-02-04 NOTE — Plan of Care (Signed)

## 2019-02-09 ENCOUNTER — Emergency Department
Admission: EM | Admit: 2019-02-09 | Discharge: 2019-02-11 | Disposition: A | Discharge status: Home / Self Care | Payer: Medicare Other | Attending: Emergency Medicine | Admitting: Emergency Medicine

## 2019-02-09 ENCOUNTER — Other Ambulatory Visit: Payer: Self-pay

## 2019-02-09 ENCOUNTER — Emergency Department: Payer: Medicare Other

## 2019-02-09 ENCOUNTER — Encounter: Payer: Self-pay | Admitting: *Deleted

## 2019-02-09 DIAGNOSIS — I4891 Unspecified atrial fibrillation: Secondary | ICD-10-CM | POA: Insufficient documentation

## 2019-02-09 DIAGNOSIS — Z955 Presence of coronary angioplasty implant and graft: Secondary | ICD-10-CM | POA: Insufficient documentation

## 2019-02-09 DIAGNOSIS — R0902 Hypoxemia: Secondary | ICD-10-CM | POA: Insufficient documentation

## 2019-02-09 DIAGNOSIS — R0602 Shortness of breath: Secondary | ICD-10-CM

## 2019-02-09 DIAGNOSIS — F17220 Nicotine dependence, chewing tobacco, uncomplicated: Secondary | ICD-10-CM | POA: Insufficient documentation

## 2019-02-09 DIAGNOSIS — J45909 Unspecified asthma, uncomplicated: Secondary | ICD-10-CM | POA: Diagnosis not present

## 2019-02-09 DIAGNOSIS — Z951 Presence of aortocoronary bypass graft: Secondary | ICD-10-CM | POA: Diagnosis not present

## 2019-02-09 DIAGNOSIS — I259 Chronic ischemic heart disease, unspecified: Secondary | ICD-10-CM | POA: Insufficient documentation

## 2019-02-09 DIAGNOSIS — Z7901 Long term (current) use of anticoagulants: Secondary | ICD-10-CM | POA: Insufficient documentation

## 2019-02-09 DIAGNOSIS — I10 Essential (primary) hypertension: Secondary | ICD-10-CM | POA: Diagnosis not present

## 2019-02-09 DIAGNOSIS — E039 Hypothyroidism, unspecified: Secondary | ICD-10-CM | POA: Insufficient documentation

## 2019-02-09 DIAGNOSIS — Z79899 Other long term (current) drug therapy: Secondary | ICD-10-CM | POA: Diagnosis not present

## 2019-02-09 DIAGNOSIS — E119 Type 2 diabetes mellitus without complications: Secondary | ICD-10-CM | POA: Diagnosis not present

## 2019-02-09 DIAGNOSIS — G20 Parkinson's disease: Secondary | ICD-10-CM | POA: Diagnosis not present

## 2019-02-09 DIAGNOSIS — I82431 Acute embolism and thrombosis of right popliteal vein: Secondary | ICD-10-CM | POA: Insufficient documentation

## 2019-02-09 DIAGNOSIS — Z96641 Presence of right artificial hip joint: Secondary | ICD-10-CM | POA: Diagnosis not present

## 2019-02-09 DIAGNOSIS — U071 COVID-19: Secondary | ICD-10-CM | POA: Diagnosis not present

## 2019-02-09 DIAGNOSIS — R4182 Altered mental status, unspecified: Secondary | ICD-10-CM | POA: Diagnosis present

## 2019-02-09 DIAGNOSIS — Z96653 Presence of artificial knee joint, bilateral: Secondary | ICD-10-CM | POA: Insufficient documentation

## 2019-02-09 LAB — CBC WITH DIFFERENTIAL/PLATELET
Abs Immature Granulocytes: 0.04 10*3/uL (ref 0.00–0.07)
Basophils Absolute: 0 10*3/uL (ref 0.0–0.1)
Basophils Relative: 0 %
Eosinophils Absolute: 0 10*3/uL (ref 0.0–0.5)
Eosinophils Relative: 0 %
HCT: 30.2 % — ABNORMAL LOW (ref 39.0–52.0)
Hemoglobin: 8.7 g/dL — ABNORMAL LOW (ref 13.0–17.0)
Immature Granulocytes: 0 %
Lymphocytes Relative: 4 %
Lymphs Abs: 0.4 10*3/uL — ABNORMAL LOW (ref 0.7–4.0)
MCH: 23.6 pg — ABNORMAL LOW (ref 26.0–34.0)
MCHC: 28.8 g/dL — ABNORMAL LOW (ref 30.0–36.0)
MCV: 82.1 fL (ref 80.0–100.0)
Monocytes Absolute: 0.3 10*3/uL (ref 0.1–1.0)
Monocytes Relative: 3 %
Neutro Abs: 8.3 10*3/uL — ABNORMAL HIGH (ref 1.7–7.7)
Neutrophils Relative %: 93 %
Platelets: 195 10*3/uL (ref 150–400)
RBC: 3.68 MIL/uL — ABNORMAL LOW (ref 4.22–5.81)
RDW: 16.7 % — ABNORMAL HIGH (ref 11.5–15.5)
WBC: 8.8 10*3/uL (ref 4.0–10.5)
nRBC: 0.2 % (ref 0.0–0.2)

## 2019-02-09 LAB — COMPREHENSIVE METABOLIC PANEL
ALT: 15 U/L (ref 0–44)
AST: 115 U/L — ABNORMAL HIGH (ref 15–41)
Albumin: 2.2 g/dL — ABNORMAL LOW (ref 3.5–5.0)
Alkaline Phosphatase: 45 U/L (ref 38–126)
Anion gap: 10 (ref 5–15)
BUN: 55 mg/dL — ABNORMAL HIGH (ref 8–23)
CO2: 22 mmol/L (ref 22–32)
Calcium: 9.5 mg/dL (ref 8.9–10.3)
Chloride: 117 mmol/L — ABNORMAL HIGH (ref 98–111)
Creatinine, Ser: 1.76 mg/dL — ABNORMAL HIGH (ref 0.61–1.24)
GFR calc Af Amer: 41 mL/min — ABNORMAL LOW (ref >60)
GFR calc non Af Amer: 36 mL/min — ABNORMAL LOW (ref >60)
Glucose, Bld: 74 mg/dL (ref 70–99)
Potassium: 5.5 mmol/L — ABNORMAL HIGH (ref 3.5–5.1)
Sodium: 149 mmol/L — ABNORMAL HIGH (ref 135–145)
Total Bilirubin: 1.5 mg/dL — ABNORMAL HIGH (ref 0.3–1.2)
Total Protein: 6 g/dL — ABNORMAL LOW (ref 6.5–8.1)

## 2019-02-09 LAB — FERRITIN: Ferritin: 200 ng/mL (ref 24–336)

## 2019-02-09 LAB — FIBRIN DERIVATIVES D-DIMER (ARMC ONLY): Fibrin derivatives D-dimer (ARMC): 1648.28 ng/mL (FEU) — ABNORMAL HIGH (ref 0.00–499.00)

## 2019-02-09 LAB — GLUCOSE, CAPILLARY: Glucose-Capillary: 64 mg/dL — ABNORMAL LOW (ref 70–99)

## 2019-02-09 MED ORDER — DEXTROSE 50 % IV SOLN
50.0000 mL | Freq: Once | INTRAVENOUS | Status: AC
  Administered 2019-02-09: 50 mL via INTRAVENOUS
  Filled 2019-02-09: qty 50

## 2019-02-09 MED ORDER — SODIUM CHLORIDE 0.9 % IV SOLN
Freq: Once | INTRAVENOUS | Status: AC
  Administered 2019-02-09: 21:00:00 via INTRAVENOUS

## 2019-02-09 MED ORDER — DEXAMETHASONE SODIUM PHOSPHATE 10 MG/ML IJ SOLN
6.0000 mg | Freq: Once | INTRAMUSCULAR | Status: AC
  Administered 2019-02-09: 6 mg via INTRAVENOUS
  Filled 2019-02-09: qty 1

## 2019-02-09 NOTE — ED Notes (Signed)
Patient arrived on a NRB with gurgling respirations. Patient reacts with moans and movement away from pain source, but is easily soothed with voice. Patient has soft boots for decubitus protections and two bags of belongings upon arrival.

## 2019-02-09 NOTE — ED Notes (Signed)
[facility] for [employer] @2012 

## 2019-02-09 NOTE — ED Notes (Signed)
Report given to Annie RN 

## 2019-02-09 NOTE — ED Provider Notes (Signed)
[facility] Emergency Department Provider Note  ____________________________________________   I have reviewed the triage vital signs and the nursing notes.   HISTORY  Chief Complaint SOB  History limited by and level 5 caveat due to: AMS  HPI Sean Armstrong is a 78 y.o. male who presents to the emergency department today because of concern for shortness of breath and hypoxia. The patient is known COVID positive. Comes from living facility. They noticed the patient was becoming more altered over the past 24 hours. The patient was found to be significantly hypoxic. Was placed on non rebreather by EMS. Patient himself is unable to give significant history.   Records reviewed. Per medical record review patient has a history of recent admission for hypoglycemia, COVID.   Past Medical History:  Diagnosis Date  . Anemia   . Asthma   . Colon polyp   . Coronary artery disease   . Diabetes mellitus without complication ([place])   . Dysrhythmia   . Falls frequently   . FHx: SVT (supraventricular tachycardia) 1998  . GERD (gastroesophageal reflux disease)   . History of DVT (deep vein thrombosis) 2009  . History of kidney stones   . History of pulmonary embolism 08/2008  . Hyperlipidemia   . Hypertension   . Hypothyroidism   . Lumbar disc disease   . MGUS (monoclonal gammopathy of unknown significance)   . Myocardial infarction ([place])    1995  . Nephrolithiasis   . Osteoarthritis   . Parkinson's disease ([place])   . Peptic ulcer disease   . Renal insufficiency   . Rheumatoid arthritis [facility])     Patient Active Problem List   Diagnosis Date Noted  . Hypoglycemia 02/01/2019  . Hypothyroidism   . Hypertension   . Diabetes mellitus without complication ([place])   . Coronary artery disease   . Carotid stenosis 02/25/2018  . AAA (abdominal aortic aneurysm) without rupture ([place]) 09/29/2016  . PAD (peripheral artery disease) ([place]) 09/29/2016  . Rib fracture  08/08/2016  . Constipation 08/08/2016  . Deep vein thrombosis (DVT) of popliteal vein of right lower extremity ([place]) 06/18/2016  . Left leg weakness 12/05/2015  . UTI (lower urinary tract infection) 12/05/2015  . Diabetes ([place]) 12/05/2015  . Anxiety 12/05/2015  . GERD (gastroesophageal reflux disease) 12/05/2015  . HLD (hyperlipidemia) 12/05/2015  . BPH (benign prostatic hyperplasia) 12/05/2015  . A-fib ([place]) 12/05/2015  . Sepsis ([place]) 06/18/2015  . History of DVT (deep vein thrombosis) 2009    Past Surgical History:  Procedure Laterality Date  . ABLATION     av node entry  . AMPUTATION TOE Right 10/08/2018   Procedure: AMPUTATION TOE MPJ;  Surgeon: Sharlotte Alamo, DPM;  Location: [facility] ORS;  Service: Podiatry;  Laterality: Right;  . CARDIAC CATHETERIZATION    . CHOLECYSTECTOMY    . COLONOSCOPY  03/2007, 2006  . CORONARY ANGIOPLASTY     stents x 2  . CORONARY ARTERY BYPASS GRAFT    . ESOPHAGOGASTRODUODENOSCOPY (EGD) WITH PROPOFOL N/A 07/02/2015   Procedure: ESOPHAGOGASTRODUODENOSCOPY (EGD) WITH PROPOFOL;  Surgeon: Manya Silvas, MD;  Location: [facility] ENDOSCOPY;  Service: Endoscopy;  Laterality: N/A;  . EYE SURGERY    . JOINT REPLACEMENT    . NASAL/SINUS ENDOSCOPY    . REPLACEMENT TOTAL KNEE BILATERAL    . TOTAL HIP ARTHROPLASTY Right     Prior to Admission medications   Medication Sig Start Date End Date Taking? Authorizing Provider  apixaban (ELIQUIS) 5 MG TABS tablet Take 1  tablet (5 mg total) by mouth 2 (two) times daily. 12/07/15   Hillary Bow, MD  atenolol (TENORMIN) 25 MG tablet Take 0.5 tablets (12.5 mg total) by mouth daily. Patient taking differently: Take 25 mg by mouth daily.  12/07/15   Hillary Bow, MD  atorvastatin (LIPITOR) 80 MG tablet Take 1 tablet (80 mg total) by mouth daily at 6 PM. 12/07/15   Hillary Bow, MD  benzonatate (TESSALON) 100 MG capsule Take 1 capsule (100 mg total) by mouth 3 (three) times daily. 02/04/19   Fritzi Mandes, MD  carbidopa-levodopa  (SINEMET IR) 25-100 MG tablet Take 1 tablet by mouth 4 (four) times daily.    [provider]  cetirizine (ZYRTEC) 10 MG tablet Take 10 mg by mouth daily.    [provider]  cyanocobalamin (,VITAMIN B-12,) 1000 MCG/ML injection Inject 1,000 mcg into the muscle every 30 (thirty) days.    [provider]  fluticasone (FLONASE) 50 MCG/ACT nasal spray Place 2 sprays into both nostrils daily.    [provider]  gabapentin (NEURONTIN) 100 MG capsule Take 100 mg by mouth daily.     [provider]  guaiFENesin (ROBITUSSIN) 100 MG/5ML SOLN Take 5 mLs (100 mg total) by mouth every 4 (four) hours as needed for cough or to loosen phlegm. 02/04/19   Fritzi Mandes, MD  hydroxychloroquine (PLAQUENIL) 200 MG tablet Take 200 mg by mouth 2 (two) times daily.    [provider]  hydroxypropyl methylcellulose / hypromellose (ISOPTO TEARS / GONIOVISC) 2.5 % ophthalmic solution Place 1 drop into both eyes 3 (three) times daily as needed for dry eyes.    [provider]  Multiple Vitamins-Minerals (CENTRUM SILVER PO) Take 1 tablet by mouth daily.     [provider]  nystatin-triamcinolone ointment (MYCOLOG) Apply 1 application topically 2 (two) times daily. 01/12/19   Billey Co, MD  oxyCODONE-acetaminophen (PERCOCET) 5-325 MG tablet Take 1 tablet by mouth every 6 (six) hours as needed for moderate pain or severe pain. 02/04/19   Fritzi Mandes, MD  polyethylene glycol powder (GLYCOLAX/MIRALAX) powder 2 cap fulls in a full glass of water, three times a day, for 5 days. Patient not taking: Reported on 09/29/2018 08/07/16   Carrie Mew, MD  potassium chloride SA (K-DUR,KLOR-CON) 20 MEQ tablet Take 20 mEq by mouth 2 (two) times daily.     [provider]  predniSONE (DELTASONE) 5 MG tablet Take 5 mg by mouth daily with breakfast.     [provider]  sodium chloride (OCEAN) 0.65 % SOLN nasal spray Place 1 spray into both nostrils  as needed for congestion.    [provider]  sucralfate (CARAFATE) 1 g tablet Take 1 g by mouth 2 (two) times daily.    [provider]  tamsulosin (FLOMAX) 0.4 MG CAPS capsule Take 0.4 mg by mouth daily after breakfast.     [provider]  traZODone (DESYREL) 50 MG tablet Take 75 mg by mouth at bedtime.    [provider]    Allergies Gentamycin [gentamicin], Iodinated diagnostic agents, Meloxicam, Metrizamide, Penicillin v potassium, Celecoxib, Ciprofloxacin, Hydrocodone, and Propranolol  Family History  Problem Relation Age of Onset  . Heart attack Father   . Diabetes Father   . Heart attack Brother   . Prostate cancer Neg Hx   . Bladder Cancer Neg Hx   . Kidney cancer Neg Hx     Social History Social History   Tobacco Use  . Smoking status:  Former Smoker    Types: Cigarettes  . Smokeless tobacco: Current User    Types: Chew  . Tobacco comment: quit smoking cigerates 1995  Substance Use Topics  . Alcohol use: No    Alcohol/week: 0.0 standard drinks  . Drug use: No    Review of Systems Unable to obtain reliable ROS secondary to altered mental status.  ____________________________________________   PHYSICAL EXAM:  VITAL SIGNS: ED Triage Vitals [02/09/19 1909]  Enc Vitals Group     BP 128/71     Pulse Rate 88     Resp (!) 26     Temp      Temp src      SpO2 97 %   Constitutional: Somnolent. Slightly awakens to verbal stimuli.  Eyes: Conjunctivae are normal.  ENT      Head: Normocephalic and atraumatic.      Nose: No congestion/rhinnorhea.      Mouth/Throat: Mucous membranes are moist.      Neck: No stridor. Hematological/Lymphatic/Immunilogical: No cervical lymphadenopathy. Cardiovascular: Irregularly irregular rhythm.  No murmurs, rubs, or gallops.  Respiratory: Normal respiratory effort without tachypnea nor retractions. Breath sounds are clear and equal bilaterally. No wheezes/rales/rhonchi. Gastrointestinal:  Tachypnea  Genitourinary: Deferred Musculoskeletal: Normal range of motion in all extremities. No lower extremity edema. Neurologic:  Somnolent. Awakens to verbal stimuli.  Skin:  Skin is warm, dry and intact. No rash noted. ____________________________________________    LABS (pertinent positives/negatives)  CBC wbc 8.8, hgb 8.7, plt 195 CMP na 149, k 5.5, cl 117, cr 1.76 ____________________________________________   EKG  I, Nance Pear, attending physician, personally viewed and interpreted this EKG  EKG Time: 1913 Rate: 93 Rhythm: atrial fibrillation Axis: right axis deviation Intervals: qtc 440 QRS: nonspecific intraventricular conduction delay, q waves V1, V2, V3 ST changes: no st elevation Impression: abnormal ekg   ____________________________________________    RADIOLOGY  CXR Multifocal pneumonia  ____________________________________________   PROCEDURES  Procedures  ____________________________________________   INITIAL IMPRESSION / ASSESSMENT AND PLAN / ED COURSE  Pertinent labs & imaging results that were available during my care of the patient were reviewed by me and considered in my medical decision making (see chart for details).   Patient presented to the emergency department today from living facility because of concerns for significant shortness of breath and altered mental status in setting of Covid positive status.  Patient was on nonrebreather on a presentation the emergency department.  He is tachypneic.  He is somnolent.  Patient did come with a MOST form the did have comfort measures checked.  I discussed with the son Christia Reading and daughter-in-law over the phone.  At this point they do not want patient to be intubated.  They would like further measures up to that especially in terms of keeping the patient comfortable.  Will plan on admission.  Did discuss with [facility].   ____________________________________________   FINAL  CLINICAL IMPRESSION(S) / ED DIAGNOSES  Final diagnoses:  Hypoxia  SOB (shortness of breath)  COVID-19     Note: This dictation was prepared with Dragon dictation. Any transcriptional errors that result from this process are unintentional     Nance Pear, MD 02/09/19 2041

## 2019-02-09 NOTE — ED Triage Notes (Signed)
Pt arrives via [facility], called out for SOB. Pt is from [facility]. +COVID. Staff advised over 24 decline in mental status and resp efforts. Sats have been in the 70's. HR 90's. 98/58 manually. Sats 96% NRB. Left AC IV. Pt has MOST form.

## 2019-02-09 NOTE — ED Notes (Signed)
Patient was repositioned on stretcher. Patient prefers not to have blanket on left leg. Patient pulls it off when blanket is repositioned. Patient is 98% on NRB. Gurgling respirations have diminished somewhat. Belongings are at bedside.

## 2019-02-10 DIAGNOSIS — R0902 Hypoxemia: Secondary | ICD-10-CM | POA: Diagnosis not present

## 2019-02-10 MED ORDER — MORPHINE SULFATE (PF) 2 MG/ML IV SOLN
2.0000 mg | Freq: Once | INTRAVENOUS | Status: AC
  Administered 2019-02-10: 2 mg via INTRAVENOUS
  Filled 2019-02-10: qty 1

## 2019-02-10 NOTE — ED Notes (Signed)
This nurse noted that the EKG and O2 sats were disconnected on the monitor at the nursing station. Entered room to find patient tugging at his mask. Pts eyes were open but he did not orient to voice when spoken to. Pt calmed and allowed for the replacement of his leads and O2 sensor. Pt resting

## 2019-02-10 NOTE — ED Notes (Signed)
Called [facility] for update on transfer, no beds available at this time  [phone]

## 2019-02-10 NOTE — ED Notes (Addendum)
Pt resting comfortably at this time. Gurgling respirations still present at this time. Respirations appear labored at this time.

## 2019-02-10 NOTE — ED Notes (Addendum)
Pt repositioned onto right side by this rn and sam rn. Pt dry at this time.

## 2019-02-10 NOTE — ED Notes (Signed)
On entry to patient's room pt had removed non-rebreather. Non-rebreather placed back on patient and O2 sats at 100%. Pt also repositioned in bed.

## 2019-02-10 NOTE — ED Notes (Signed)
On entry to room patient had removed non-rebreather, patient placed back on by this RN O2 sats now 100%.

## 2019-02-10 NOTE — ED Notes (Signed)
Upon entry to room pt had removed gown and non-rebreather. Pt placed back into gown and non-rebreather placed back on pt O2 100%. Pt repositioned onto right side.Pt dry- no urine output to condom catheter.

## 2019-02-10 NOTE — ED Provider Notes (Signed)
-----------------------------------------   6:33 AM on 02/10/2019 -----------------------------------------  Patient remains in the ED awaiting transfer to [facility].  Remains on nonrebreather oxygen.  Remains DNI; comfort measures.   Paulette Blanch, MD 02/10/19 [phone]

## 2019-02-10 NOTE — ED Provider Notes (Signed)
-----------------------------------------   12:37 PM on 02/10/2019 -----------------------------------------  Patient continues to have respiratory difficulty.  Patient is tachypneic with audible rhonchi, still satting 100% on nonrebreather but with increased difficulty.  MOST form confirms patient is a DO NOT RESUSCITATE, we will continue to provide supplemental oxygen and supportive care for the patient.  We continue to await [facility] transfer for the patient.   Harvest Dark, MD 02/10/19 [phone]

## 2019-02-10 NOTE — ED Provider Notes (Signed)
-----------------------------------------   11:24 PM on 02/10/2019 -----------------------------------------  Patient remains on NRB. DNR/DNI. Comfort measures. Awaiting transfer to [place].    Lilia Pro., MD 02/10/19 [phone]

## 2019-02-10 NOTE — ED Notes (Addendum)
Pt repositioned onto L side at this time. Pt with wounds noted to sacral area. Pt cleaned of urine and stool incontinence. Condom cath placed to patient at this time. This RN placed pillows under R calf at this time due pressure ulcer forming. Pt noted to be alert, able to nod confirmations to questions to this RN.

## 2019-02-10 NOTE — ED Notes (Signed)
Pt's Son Rigdon Recchia updated on pt status and plan of care.

## 2019-02-10 NOTE — ED Notes (Signed)
Patient appears more comfortable while resting in bed. Pt no longer pulling at leads or at mask. Pt resting in bed at this time. Will continue to monitor for further changes in patient condition.

## 2019-02-10 NOTE — ED Notes (Addendum)
Pt cleaned and dried by this rn and sam rn. Urine noted in condom cath bag and on bedsheets. Bed changed. Mittens taken off patient hands at this time for patient comfort.

## 2019-02-10 NOTE — ED Notes (Signed)
This RN to bedside, pt with noted gurgling and labored respirations. This RN discussed with Dr. Beather Arbour mittens due to patient pulling off his NRB and EKG leads. Mittens placed on patient. Pt repositioned and covered up with blanket by this RN. Pt noted to continue to be non-verbal, with labored gurgling respirations as verbalized in report.

## 2019-02-10 NOTE — ED Notes (Signed)
Pt repositioned at this time. Pt dry.  

## 2019-02-10 NOTE — ED Notes (Signed)
Pt repositioned in bed and rolled onto left side by this RN and Health visitor. Pt dry at this time.

## 2019-02-10 NOTE — ED Notes (Signed)
Pt responsitioned at this time. Pt resting comfortably

## 2019-02-10 NOTE — ED Notes (Signed)
Entered patient's room and patient had taken off non-rebreather and pulled out his IV. Pt had blood on gown and floor. Pt cleaned up. New gown and mittens placed on patient's hands. Pt pulled up in bed and repositioned onto his left side. Pt's brief changed and pt given warm blankets. Yellow fall risk socks placed on pt's feet.

## 2019-02-10 NOTE — ED Notes (Signed)
Pt dry and this time and repositioned by Inocente Salles and Deneise Lever RN's.

## 2019-02-11 ENCOUNTER — Inpatient Hospital Stay (HOSPITAL_COMMUNITY)
Admission: AD | Admit: 2019-02-11 | Discharge: 2019-02-15 | DRG: 871 | Disposition: E | Payer: Medicare Other | Source: Other Acute Inpatient Hospital | Attending: Internal Medicine | Admitting: Internal Medicine

## 2019-02-11 DIAGNOSIS — Z515 Encounter for palliative care: Secondary | ICD-10-CM | POA: Diagnosis present

## 2019-02-11 DIAGNOSIS — J1289 Other viral pneumonia: Secondary | ICD-10-CM

## 2019-02-11 DIAGNOSIS — I959 Hypotension, unspecified: Secondary | ICD-10-CM | POA: Diagnosis present

## 2019-02-11 DIAGNOSIS — M069 Rheumatoid arthritis, unspecified: Secondary | ICD-10-CM | POA: Diagnosis present

## 2019-02-11 DIAGNOSIS — Z96653 Presence of artificial knee joint, bilateral: Secondary | ICD-10-CM | POA: Diagnosis present

## 2019-02-11 DIAGNOSIS — M519 Unspecified thoracic, thoracolumbar and lumbosacral intervertebral disc disorder: Secondary | ICD-10-CM | POA: Diagnosis present

## 2019-02-11 DIAGNOSIS — J80 Acute respiratory distress syndrome: Secondary | ICD-10-CM | POA: Diagnosis present

## 2019-02-11 DIAGNOSIS — R0902 Hypoxemia: Secondary | ICD-10-CM | POA: Diagnosis not present

## 2019-02-11 DIAGNOSIS — U071 COVID-19: Secondary | ICD-10-CM | POA: Diagnosis present

## 2019-02-11 DIAGNOSIS — K219 Gastro-esophageal reflux disease without esophagitis: Secondary | ICD-10-CM | POA: Diagnosis present

## 2019-02-11 DIAGNOSIS — I10 Essential (primary) hypertension: Secondary | ICD-10-CM | POA: Diagnosis present

## 2019-02-11 DIAGNOSIS — I2583 Coronary atherosclerosis due to lipid rich plaque: Secondary | ICD-10-CM

## 2019-02-11 DIAGNOSIS — I252 Old myocardial infarction: Secondary | ICD-10-CM

## 2019-02-11 DIAGNOSIS — I4891 Unspecified atrial fibrillation: Secondary | ICD-10-CM | POA: Diagnosis not present

## 2019-02-11 DIAGNOSIS — Z880 Allergy status to penicillin: Secondary | ICD-10-CM

## 2019-02-11 DIAGNOSIS — Z886 Allergy status to analgesic agent status: Secondary | ICD-10-CM

## 2019-02-11 DIAGNOSIS — E1122 Type 2 diabetes mellitus with diabetic chronic kidney disease: Secondary | ICD-10-CM | POA: Diagnosis present

## 2019-02-11 DIAGNOSIS — Z96641 Presence of right artificial hip joint: Secondary | ICD-10-CM | POA: Diagnosis present

## 2019-02-11 DIAGNOSIS — Z7901 Long term (current) use of anticoagulants: Secondary | ICD-10-CM

## 2019-02-11 DIAGNOSIS — E875 Hyperkalemia: Secondary | ICD-10-CM

## 2019-02-11 DIAGNOSIS — M1990 Unspecified osteoarthritis, unspecified site: Secondary | ICD-10-CM | POA: Diagnosis present

## 2019-02-11 DIAGNOSIS — Z951 Presence of aortocoronary bypass graft: Secondary | ICD-10-CM

## 2019-02-11 DIAGNOSIS — I129 Hypertensive chronic kidney disease with stage 1 through stage 4 chronic kidney disease, or unspecified chronic kidney disease: Secondary | ICD-10-CM | POA: Diagnosis present

## 2019-02-11 DIAGNOSIS — E119 Type 2 diabetes mellitus without complications: Secondary | ICD-10-CM

## 2019-02-11 DIAGNOSIS — E785 Hyperlipidemia, unspecified: Secondary | ICD-10-CM | POA: Diagnosis present

## 2019-02-11 DIAGNOSIS — E039 Hypothyroidism, unspecified: Secondary | ICD-10-CM | POA: Diagnosis present

## 2019-02-11 DIAGNOSIS — Z66 Do not resuscitate: Secondary | ICD-10-CM | POA: Diagnosis present

## 2019-02-11 DIAGNOSIS — Z8249 Family history of ischemic heart disease and other diseases of the circulatory system: Secondary | ICD-10-CM

## 2019-02-11 DIAGNOSIS — Z86711 Personal history of pulmonary embolism: Secondary | ICD-10-CM

## 2019-02-11 DIAGNOSIS — Z888 Allergy status to other drugs, medicaments and biological substances status: Secondary | ICD-10-CM

## 2019-02-11 DIAGNOSIS — Z79899 Other long term (current) drug therapy: Secondary | ICD-10-CM

## 2019-02-11 DIAGNOSIS — A4189 Other specified sepsis: Principal | ICD-10-CM | POA: Diagnosis present

## 2019-02-11 DIAGNOSIS — J1282 Pneumonia due to coronavirus disease 2019: Secondary | ICD-10-CM

## 2019-02-11 DIAGNOSIS — G9341 Metabolic encephalopathy: Secondary | ICD-10-CM | POA: Diagnosis present

## 2019-02-11 DIAGNOSIS — R0602 Shortness of breath: Secondary | ICD-10-CM | POA: Diagnosis present

## 2019-02-11 DIAGNOSIS — N1831 Chronic kidney disease, stage 3a: Secondary | ICD-10-CM | POA: Diagnosis present

## 2019-02-11 DIAGNOSIS — I2510 Atherosclerotic heart disease of native coronary artery without angina pectoris: Secondary | ICD-10-CM | POA: Diagnosis present

## 2019-02-11 DIAGNOSIS — R6520 Severe sepsis without septic shock: Secondary | ICD-10-CM | POA: Diagnosis present

## 2019-02-11 DIAGNOSIS — Z833 Family history of diabetes mellitus: Secondary | ICD-10-CM

## 2019-02-11 DIAGNOSIS — E870 Hyperosmolality and hypernatremia: Secondary | ICD-10-CM | POA: Diagnosis present

## 2019-02-11 DIAGNOSIS — N179 Acute kidney failure, unspecified: Secondary | ICD-10-CM | POA: Diagnosis present

## 2019-02-11 DIAGNOSIS — Z7189 Other specified counseling: Secondary | ICD-10-CM | POA: Diagnosis not present

## 2019-02-11 DIAGNOSIS — Z955 Presence of coronary angioplasty implant and graft: Secondary | ICD-10-CM

## 2019-02-11 DIAGNOSIS — Z91041 Radiographic dye allergy status: Secondary | ICD-10-CM

## 2019-02-11 DIAGNOSIS — Z885 Allergy status to narcotic agent status: Secondary | ICD-10-CM

## 2019-02-11 DIAGNOSIS — G20 Parkinson's disease: Secondary | ICD-10-CM | POA: Diagnosis present

## 2019-02-11 DIAGNOSIS — Z86718 Personal history of other venous thrombosis and embolism: Secondary | ICD-10-CM

## 2019-02-11 DIAGNOSIS — J9601 Acute respiratory failure with hypoxia: Secondary | ICD-10-CM | POA: Diagnosis present

## 2019-02-11 DIAGNOSIS — A419 Sepsis, unspecified organism: Secondary | ICD-10-CM | POA: Diagnosis present

## 2019-02-11 DIAGNOSIS — Z881 Allergy status to other antibiotic agents status: Secondary | ICD-10-CM

## 2019-02-11 DIAGNOSIS — I480 Paroxysmal atrial fibrillation: Secondary | ICD-10-CM | POA: Diagnosis present

## 2019-02-11 DIAGNOSIS — F419 Anxiety disorder, unspecified: Secondary | ICD-10-CM | POA: Diagnosis present

## 2019-02-11 DIAGNOSIS — E860 Dehydration: Secondary | ICD-10-CM | POA: Diagnosis present

## 2019-02-11 LAB — ABO/RH: ABO/RH(D): A POS

## 2019-02-11 MED ORDER — DEXTROSE-NACL 5-0.45 % IV SOLN
INTRAVENOUS | Status: DC
  Administered 2019-02-11: 08:00:00 via INTRAVENOUS

## 2019-02-11 MED ORDER — MORPHINE SULFATE (PF) 2 MG/ML IV SOLN
2.0000 mg | Freq: Once | INTRAVENOUS | Status: AC
  Administered 2019-02-11: 2 mg via INTRAVENOUS

## 2019-02-11 MED ORDER — HEPARIN SODIUM (PORCINE) 5000 UNIT/ML IJ SOLN
5000.0000 [IU] | Freq: Three times a day (TID) | INTRAMUSCULAR | Status: DC
  Administered 2019-02-11 – 2019-02-12 (×4): 5000 [IU] via SUBCUTANEOUS
  Filled 2019-02-11 (×4): qty 1

## 2019-02-11 MED ORDER — SODIUM CHLORIDE 0.9 % IV SOLN
200.0000 mg | Freq: Once | INTRAVENOUS | Status: AC
  Administered 2019-02-11: 200 mg via INTRAVENOUS
  Filled 2019-02-11: qty 40

## 2019-02-11 MED ORDER — MORPHINE SULFATE (PF) 2 MG/ML IV SOLN
2.0000 mg | INTRAVENOUS | Status: DC | PRN
  Administered 2019-02-11 – 2019-02-12 (×6): 2 mg via INTRAVENOUS
  Filled 2019-02-11 (×6): qty 1

## 2019-02-11 MED ORDER — DEXTROSE 5 % AND 0.45 % NACL IV BOLUS
250.0000 mL | INTRAVENOUS | Status: AC | PRN
  Administered 2019-02-11 (×2): 250 mL via INTRAVENOUS

## 2019-02-11 MED ORDER — SODIUM CHLORIDE 0.9 % IV SOLN
100.0000 mg | INTRAVENOUS | Status: DC
  Administered 2019-02-12: 100 mg via INTRAVENOUS
  Filled 2019-02-11: qty 100

## 2019-02-11 MED ORDER — MORPHINE SULFATE (PF) 2 MG/ML IV SOLN: 2.0000 mg | INTRAVENOUS | Status: DC | PRN

## 2019-02-11 MED ORDER — METHYLPREDNISOLONE SODIUM SUCC 40 MG IJ SOLR
40.0000 mg | Freq: Two times a day (BID) | INTRAMUSCULAR | Status: DC
  Administered 2019-02-11 – 2019-02-12 (×3): 40 mg via INTRAVENOUS
  Filled 2019-02-11 (×3): qty 1

## 2019-02-11 NOTE — Progress Notes (Signed)
MD in to see pt. Most form not signed by MD. Notified family (son) to update on pt status and to verify treatment moving forward.

## 2019-02-11 NOTE — Progress Notes (Signed)
Received call from MD. Reported pt status. Discrepancy in pt DNR  paperwork no doctor signature. Per MD pt might need to be transfer to ICU. Awaitng MD to see Pt. Pt O2 stable at 98% with NRB and 15 HFNC.

## 2019-02-11 NOTE — ED Notes (Signed)
Pt brief changed before transport.

## 2019-02-11 NOTE — H&P (Addendum)
History and Physical    Sean Armstrong SWH:[id number] DOB: September 15, 1940 DOA: 01/29/2019  I have briefly reviewed the patient's prior medical records in [place]  PCP: Albina Billet, MD  Patient coming from: [place] [facility] emergency room  Chief Complaint: Shortness of breath  HPI: Sean Armstrong is a 78 y.o. male with medical history significant of paroxysmal A. fib, Parkinson's disease, hypothyroidism, hypertension, hyperlipidemia, chronic kidney disease T3a, rheumatoid arthritis presented to the emergency room with shortness of breath, found to be profoundly hypoxic with chest x-ray showed bilateral infiltrates and required a nonrebreather.  Patient evaluated stat at bedside given unresponsiveness, and on my evaluation patient is moaning in response to deep sternal rub but requires a nonrebreather and appears encephalopathic on so history obtained per chart review, apparently he lives in [facility] and had a 24-hour decline in mental status with worsening breathing, requiring a nonrebreather and also becoming hypotensive with a blood pressure in the 90s.  ED Course: In the emergency room he was febrile to 100.6, tachypneic breathing into the upper 20s, initially normotensive but then hypotensive, requiring 15 L high flow/nonrebreather.  Chest x-ray showed multifocal pneumonia.  Patient has a MOST form not to be transferred to another healthcare institution if comfort care measures could be provided at current location, however he eventually was transferred to [facility].  Review of Systems: Unable to obtain review of systems due to encephalopathy  Past Medical History:  Diagnosis Date  . Anemia   . Asthma   . Colon polyp   . Coronary artery disease   . Diabetes mellitus without complication ([place])   . Dysrhythmia   . Falls frequently   . FHx: SVT (supraventricular tachycardia) 1998  . GERD (gastroesophageal reflux disease)   . History of DVT (deep vein thrombosis) 2009  . History  of kidney stones   . History of pulmonary embolism 08/2008  . Hyperlipidemia   . Hypertension   . Hypothyroidism   . Lumbar disc disease   . MGUS (monoclonal gammopathy of unknown significance)   . Myocardial infarction ([place])    1995  . Nephrolithiasis   . Osteoarthritis   . Parkinson's disease ([place])   . Peptic ulcer disease   . Renal insufficiency   . Rheumatoid arthritis [facility])     Past Surgical History:  Procedure Laterality Date  . ABLATION     av node entry  . AMPUTATION TOE Right 10/08/2018   Procedure: AMPUTATION TOE MPJ;  Surgeon: Sharlotte Alamo, DPM;  Location: [facility] ORS;  Service: Podiatry;  Laterality: Right;  . CARDIAC CATHETERIZATION    . CHOLECYSTECTOMY    . COLONOSCOPY  03/2007, 2006  . CORONARY ANGIOPLASTY     stents x 2  . CORONARY ARTERY BYPASS GRAFT    . ESOPHAGOGASTRODUODENOSCOPY (EGD) WITH PROPOFOL N/A 07/02/2015   Procedure: ESOPHAGOGASTRODUODENOSCOPY (EGD) WITH PROPOFOL;  Surgeon: Manya Silvas, MD;  Location: [facility] ENDOSCOPY;  Service: Endoscopy;  Laterality: N/A;  . EYE SURGERY    . JOINT REPLACEMENT    . NASAL/SINUS ENDOSCOPY    . REPLACEMENT TOTAL KNEE BILATERAL    . TOTAL HIP ARTHROPLASTY Right      reports that he has quit smoking. His smoking use included cigarettes. His smokeless tobacco use includes chew. He reports that he does not drink alcohol or use drugs.  Allergies  Allergen Reactions  . Gentamycin [Gentamicin] Other (See Comments)    Reaction:  Caused kidney issues for pt   . Iodinated Diagnostic Agents  Other reaction(s): Other (See Comments) "Burning sensation throughout whole body"  . Meloxicam     Other reaction(s): Other (See Comments) GI UPSET  . Metrizamide     Other reaction(s): Other (See Comments) "Burning sensation throughout whole body"  . Penicillin V Potassium   . Celecoxib Nausea Only    Other reaction(s): Other (See Comments) GI upset  . Ciprofloxacin Nausea Only  . Hydrocodone Nausea Only  . Propranolol  Palpitations    Family History  Problem Relation Age of Onset  . Heart attack Father   . Diabetes Father   . Heart attack Brother   . Prostate cancer Neg Hx   . Bladder Cancer Neg Hx   . Kidney cancer Neg Hx     Prior to Admission medications   Medication Sig Start Date End Date Taking? Authorizing Provider  apixaban (ELIQUIS) 5 MG TABS tablet Take 1 tablet (5 mg total) by mouth 2 (two) times daily. 12/07/15   Hillary Bow, MD  atenolol (TENORMIN) 25 MG tablet Take 0.5 tablets (12.5 mg total) by mouth daily. Patient taking differently: Take 25 mg by mouth daily.  12/07/15   Hillary Bow, MD  atorvastatin (LIPITOR) 80 MG tablet Take 1 tablet (80 mg total) by mouth daily at 6 PM. 12/07/15   Hillary Bow, MD  benzonatate (TESSALON) 100 MG capsule Take 1 capsule (100 mg total) by mouth 3 (three) times daily. 02/04/19   Fritzi Mandes, MD  carbidopa-levodopa (SINEMET IR) 25-100 MG tablet Take 1 tablet by mouth 4 (four) times daily.    [provider]  cetirizine (ZYRTEC) 10 MG tablet Take 10 mg by mouth daily.    [provider]  cyanocobalamin (,VITAMIN B-12,) 1000 MCG/ML injection Inject 1,000 mcg into the muscle every 30 (thirty) days.    [provider]  fluticasone (FLONASE) 50 MCG/ACT nasal spray Place 2 sprays into both nostrils daily.    [provider]  gabapentin (NEURONTIN) 100 MG capsule Take 100 mg by mouth daily.     [provider]  guaiFENesin (ROBITUSSIN) 100 MG/5ML SOLN Take 5 mLs (100 mg total) by mouth every 4 (four) hours as needed for cough or to loosen phlegm. 02/04/19   Fritzi Mandes, MD  hydroxychloroquine (PLAQUENIL) 200 MG tablet Take 200 mg by mouth 2 (two) times daily.    [provider]  hydroxypropyl methylcellulose / hypromellose (ISOPTO TEARS / GONIOVISC) 2.5 % ophthalmic solution Place 1 drop into both eyes 3 (three) times daily as needed for dry eyes.    [provider]  Multiple Vitamins-Minerals  (CENTRUM SILVER PO) Take 1 tablet by mouth daily.     [provider]  nystatin-triamcinolone ointment (MYCOLOG) Apply 1 application topically 2 (two) times daily. 01/12/19   Billey Co, MD  oxyCODONE-acetaminophen (PERCOCET) 5-325 MG tablet Take 1 tablet by mouth every 6 (six) hours as needed for moderate pain or severe pain. 02/04/19   Fritzi Mandes, MD  potassium chloride SA (K-DUR,KLOR-CON) 20 MEQ tablet Take 20 mEq by mouth 2 (two) times daily.     [provider]  predniSONE (DELTASONE) 5 MG tablet Take 5 mg by mouth daily with breakfast.     [provider]  sodium chloride (OCEAN) 0.65 % SOLN nasal spray Place 1 spray into both nostrils as needed for congestion.    [provider]  sucralfate (CARAFATE) 1 g tablet Take 1 g by mouth 2 (two) times daily.    [provider]  tamsulosin (FLOMAX) 0.4  MG CAPS capsule Take 0.4 mg by mouth daily after breakfast.     [provider]  traZODone (DESYREL) 50 MG tablet Take 75 mg by mouth at bedtime.    [provider]    Physical Exam: Vitals:   02/01/2019 0647 01/17/2019 0700 01/26/2019 0800  BP: (!) 82/64 (!) 80/58 (!) 88/43  Pulse: (!) 49 88 92  Resp: (!) 34 (!) 34 (!) 40  Temp: 99.7 F (37.6 C)  99.5 F (37.5 C)  TempSrc: Axillary  Axillary  SpO2: (!) 78% 92% 93%   Constitutional: Unresponsive Eyes: No scleral icterus ENMT: Dry mucous membranes Neck: normal, supple Respiratory: Rhonchi diffusely bilaterally, no wheezing, coarse breath sounds.  Shallow respirations Cardiovascular: Regular rate and rhythm, no murmurs appreciated, trace lower extremity edema Abdomen: No apparent tenderness, bowel sounds positive Musculoskeletal: no clubbing / cyanosis.  Skin: No rashes seen Neurologic: Unresponsive, does not follow commands Psychiatric: Obtunded  Labs on Admission: I have personally reviewed following labs and imaging studies  CBC: Recent Labs  Lab 02/09/19 1926  WBC  8.8  NEUTROABS 8.3*  HGB 8.7*  HCT 30.2*  MCV 82.1  PLT 837   Basic Metabolic Panel: Recent Labs  Lab 02/09/19 1926  NA 149*  K 5.5*  CL 117*  CO2 22  GLUCOSE 74  BUN 55*  CREATININE 1.76*  CALCIUM 9.5   Liver Function Tests: Recent Labs  Lab 02/09/19 1926  AST 115*  ALT 15  ALKPHOS 45  BILITOT 1.5*  PROT 6.0*  ALBUMIN 2.2*   Coagulation Profile: No results for input(s): INR, PROTIME in the last 168 hours. BNP (last 3 results) No results for input(s): PROBNP in the last 8760 hours. CBG: Recent Labs  Lab 02/04/19 1556 02/09/19 1927  GLUCAP 154* 64*   Thyroid Function Tests: No results for input(s): TSH, T4TOTAL, FREET4, T3FREE, THYROIDAB in the last 72 hours. Urine analysis:    Component Value Date/Time   COLORURINE YELLOW (A) 02/01/2019 1107   APPEARANCEUR CLEAR (A) 02/01/2019 1107   APPEARANCEUR Cloudy (A) 01/12/2019 1541   LABSPEC 1.015 02/01/2019 1107   LABSPEC 1.018 06/23/2014 1229   PHURINE 5.0 02/01/2019 1107   GLUCOSEU NEGATIVE 02/01/2019 1107   GLUCOSEU 50 mg/dL 06/23/2014 1229   HGBUR NEGATIVE 02/01/2019 1107   BILIRUBINUR NEGATIVE 02/01/2019 1107   BILIRUBINUR Negative 01/12/2019 1541   BILIRUBINUR Negative 06/23/2014 1229   KETONESUR NEGATIVE 02/01/2019 1107   PROTEINUR NEGATIVE 02/01/2019 1107   NITRITE NEGATIVE 02/01/2019 1107   LEUKOCYTESUR NEGATIVE 02/01/2019 1107   LEUKOCYTESUR Trace 06/23/2014 1229     Radiological Exams on Admission: Dg Chest Portable 1 View  Result Date: 02/09/2019 CLINICAL DATA:  Shortness of breath. COVID pneumonia. Decline in mental status and respiratory effort. EXAM: PORTABLE CHEST 1 VIEW COMPARISON:  01/10/2018 FINDINGS: Bilateral airspace opacities may reflect multilobar bilateral pneumonia or noncardiogenic edema. Atherosclerotic calcification of the aortic arch. Upper normal heart size. No blunting of the costophrenic angles. Mild thoracic spondylosis. IMPRESSION: 1. Bilateral airspace opacities may  reflect multilobar pneumonia or noncardiogenic edema. 2.  Aortic Atherosclerosis (ICD10-I70.0). Electronically Signed   By: Van Clines M.D.   On: 02/09/2019 19:47    EKG: Independently reviewed.  A. fib  Assessment/Plan Active Problems:   Sepsis ([place])   Anxiety   HLD (hyperlipidemia)   A-fib (HCC)   Hypothyroidism   Hypertension   Diabetes mellitus without complication ([place])   Coronary artery disease   History of DVT (deep vein thrombosis)   COVID-19  Hypernatremia   Hyperkalemia   Acute hypoxemic respiratory failure (HCC)   Acute respiratory distress syndrome (ARDS) due to COVID-19 virus [facility])   Principal Problem Acute hypoxic respiratory failure due to COVID-19 pneumonia / ARDS -Patient admitted to the hospital, he tested positive for the SARS-CoV-2 on 02/01/2019 -Initiate fluids, remdesivir, steroids however he may not survive this hospitalization as he is poorly responsive and was profoundly hypoxic for an unknown period of time  Active Problems Goals of care -Called patient's son Lajuane Leatham [phone] and confirmed DNR/DNI status with him as well as confirmed the MOST form that as per patient's wishes if he is to fail medical treatment he wishes to be transition to comfort -Family agrees with fluids, antivirals but no transfer to the ICU and they wish that comfort measures are in place as they do not want him to suffer at any given point.  Will add as needed morphine for air hunger  Acute metabolic encephalopathy -In the setting of critical illness/sepsis  Sepsis due to COVID-19 -Support as above, he met criteria for sepsis with fever, tachypnea, hypotension, endorgan damage with acute kidney injury  Acute kidney injury on chronic kidney disease stage IIIa -Creatinine elevated to 1.7 from baseline of about 1.0-1.2, provide fluids, clinically appears dehydrated  Hypernatremia -Likely in the setting of dehydration, monitor sodium levels with fluids   Hyperkalemia -Related to acute kidney injury, monitor  Paroxysmal A. fib -Supportive treatment at this point, currently is rate controlled.  Hold Eliquis as he is strict n.p.o., not clear candidate for anticoagulation at this point  Parkinson's disease -Hold oral agents  History of DVT -Heparin subcu for DVT prophylaxis for now  Type 2 diabetes mellitus -On oral agents at home, currently n.p.o.  Hypertension -Currently hypotensive, will provide fluids  DVT prophylaxis: heparin  Code Status: DNR/DNI  Family Communication: d/w son over the phone Disposition Plan: TBD, high likelihood of in hospital death Bed Type: MedSurg Consults called: None Obs/Inp: Inpatient  Marzetta Board, MD, PhD [facility]  Contact via [email or web address]  02/13/2019, 9:13 AM

## 2019-02-11 NOTE — Progress Notes (Signed)
MD updated with pt's latest BP and he was made aware that the patient's lungs sounded wet. Fluids stopped for right now to see how BP does.

## 2019-02-11 NOTE — ED Notes (Signed)
Pt unable to sign for consent, pt family verbalized consent for transport with MD. No further questions at this time.

## 2019-02-11 NOTE — Progress Notes (Signed)
Pt just arrive via [facility]. Pt unresponsive. Pt O2 78% on NRB at 15 liters. MD aware of pt arrival. Added HFNC 15 liters to improve O2 pt status went to 93% Not able to complete admission navigators. Pt Most form is incomplete no MD signature. Awaiting call back from MD. Will continue to monitor pt.

## 2019-02-11 NOTE — ED Notes (Signed)
Pt repositioned in bed by this RN and Anda Kraft, Therapist, sports. Brief changed and pillows repositioned to pt's comfort. Pt oxygen currently 94% on non-rebreather.

## 2019-02-12 DIAGNOSIS — Z515 Encounter for palliative care: Secondary | ICD-10-CM

## 2019-02-12 DIAGNOSIS — Z7189 Other specified counseling: Secondary | ICD-10-CM

## 2019-02-12 LAB — CBC WITH DIFFERENTIAL/PLATELET
Abs Immature Granulocytes: 0.08 10*3/uL — ABNORMAL HIGH (ref 0.00–0.07)
Basophils Absolute: 0 10*3/uL (ref 0.0–0.1)
Basophils Relative: 0 %
Eosinophils Absolute: 0 10*3/uL (ref 0.0–0.5)
Eosinophils Relative: 0 %
HCT: 30.2 % — ABNORMAL LOW (ref 39.0–52.0)
Hemoglobin: 8.5 g/dL — ABNORMAL LOW (ref 13.0–17.0)
Immature Granulocytes: 2 %
Lymphocytes Relative: 9 %
Lymphs Abs: 0.4 10*3/uL — ABNORMAL LOW (ref 0.7–4.0)
MCH: 23.5 pg — ABNORMAL LOW (ref 26.0–34.0)
MCHC: 28.1 g/dL — ABNORMAL LOW (ref 30.0–36.0)
MCV: 83.7 fL (ref 80.0–100.0)
Monocytes Absolute: 0.1 10*3/uL (ref 0.1–1.0)
Monocytes Relative: 3 %
Neutro Abs: 3.6 10*3/uL (ref 1.7–7.7)
Neutrophils Relative %: 86 %
Platelets: 200 10*3/uL (ref 150–400)
RBC: 3.61 MIL/uL — ABNORMAL LOW (ref 4.22–5.81)
RDW: 17.1 % — ABNORMAL HIGH (ref 11.5–15.5)
WBC: 4.2 10*3/uL (ref 4.0–10.5)
nRBC: 2.1 % — ABNORMAL HIGH (ref 0.0–0.2)

## 2019-02-12 LAB — COMPREHENSIVE METABOLIC PANEL
ALT: 29 U/L (ref 0–44)
AST: 72 U/L — ABNORMAL HIGH (ref 15–41)
Albumin: <1 g/dL — ABNORMAL LOW (ref 3.5–5.0)
Alkaline Phosphatase: 18 U/L — ABNORMAL LOW (ref 38–126)
Anion gap: 7 (ref 5–15)
BUN: 99 mg/dL — ABNORMAL HIGH (ref 8–23)
CO2: 19 mmol/L — ABNORMAL LOW (ref 22–32)
Calcium: 8 mg/dL — ABNORMAL LOW (ref 8.9–10.3)
Chloride: 129 mmol/L — ABNORMAL HIGH (ref 98–111)
Creatinine, Ser: 2.46 mg/dL — ABNORMAL HIGH (ref 0.61–1.24)
GFR calc Af Amer: 28 mL/min — ABNORMAL LOW (ref >60)
GFR calc non Af Amer: 24 mL/min — ABNORMAL LOW (ref >60)
Glucose, Bld: 115 mg/dL — ABNORMAL HIGH (ref 70–99)
Potassium: 6.2 mmol/L — ABNORMAL HIGH (ref 3.5–5.1)
Sodium: 155 mmol/L — ABNORMAL HIGH (ref 135–145)
Total Bilirubin: 1.4 mg/dL — ABNORMAL HIGH (ref 0.3–1.2)
Total Protein: <3 g/dL — ABNORMAL LOW (ref 6.5–8.1)

## 2019-02-12 MED ORDER — MORPHINE 100MG IN NS 100ML (1MG/ML) PREMIX INFUSION: 1.0000 mg/h | INTRAVENOUS | Status: DC

## 2019-02-12 MED ORDER — FUROSEMIDE 10 MG/ML IJ SOLN
40.0000 mg | Freq: Once | INTRAMUSCULAR | Status: AC
  Administered 2019-02-12: 40 mg via INTRAVENOUS
  Filled 2019-02-12: qty 4

## 2019-02-12 MED ORDER — MORPHINE 100MG IN NS 100ML (1MG/ML) PREMIX INFUSION
1.0000 mg/h | INTRAVENOUS | Status: DC
  Administered 2019-02-12: 1 mg/h via INTRAVENOUS
  Filled 2019-02-12: qty 100

## 2019-02-12 MED ORDER — SCOPOLAMINE 1 MG/3DAYS TD PT72
1.0000 | MEDICATED_PATCH | TRANSDERMAL | Status: DC
  Administered 2019-02-12: 1.5 mg via TRANSDERMAL
  Filled 2019-02-12: qty 1

## 2019-02-15 NOTE — Progress Notes (Signed)
PROGRESS NOTE  Sean Armstrong [id number] DOB: 13-May-1940 DOA: 01/18/2019 PCP: Albina Billet, MD   LOS: 1 day   Brief Narrative / Interim history: 78 y.o. male with medical history significant of paroxysmal A. fib, Parkinson's disease, hypothyroidism, hypertension, hyperlipidemia, chronic kidney disease T3a, rheumatoid arthritis presented to the emergency room with shortness of breath, found to be profoundly hypoxic with chest x-ray showed bilateral infiltrates and required a nonrebreather.  Since admission he has been mostly unresponsive  Subjective / 24h Interval events: Unresponsive, opens eyes with sternal rub but does not talk and drifts back asleep  Assessment & Plan: Active Problems:   Sepsis ([place])   Anxiety   HLD (hyperlipidemia)   A-fib (HCC)   Hypothyroidism   Hypertension   Diabetes mellitus without complication ([place])   Coronary artery disease   History of DVT (deep vein thrombosis)   COVID-19   Hypernatremia   Hyperkalemia   Acute hypoxemic respiratory failure (HCC)   Acute respiratory distress syndrome (ARDS) due to COVID-19 virus ([place])   Pneumonia due to COVID-19 virus   Acute metabolic encephalopathy   Principal Problem Principal Problem Acute hypoxic respiratory failure due to COVID-19 pneumonia / ARDS -Patient admitted to the hospital, he tested positive for the SARS-CoV-2 on 02/01/2019 -Initially started on remdesivir, steroids, fluids, however after discussion with the family will transition to comfort care Goals of care -Discussed again with patient's family today that patient does not seem to be responding to treatment, his kidney function is getting worse and he is not having any meaningful recovery.  Patient does have a MOST form in which he wishes for comfort only, and family is ready to fulfill his wishes.  Discontinue nonessential medication, placed on full comfort, initiate morphine infusion and adjust for air hunger/discomfort  Active problems  Acute metabolic encephalopathy -In the setting of critical illness/sepsis Sepsis due to COVID-19 -Now comfort measures in place Acute kidney injury on chronic kidney disease stage IIIa -Worsening renal failure, anuric Hypernatremia -Worsening despite fluids, now comfort measures Hyperkalemia -Worsening Paroxysmal A. fib -Now comfort measures Parkinson's disease History of DVT Type 2 diabetes mellitus Hypertension   Scheduled Meds: . heparin  5,000 Units Subcutaneous Q8H  . methylPREDNISolone (SOLU-MEDROL) injection  40 mg Intravenous Q12H   Continuous Infusions: . remdesivir 100 mg in NS 250 mL 100 mg (02-20-19 0952)   PRN Meds:.morphine injection  DVT prophylaxis: comfort Code Status: DNR Family Communication: d/w son over the phone Disposition Plan: anticipate in-hospital death   Consultants:  None   Procedures:  None   Microbiology: None   Antimicrobials: None    Objective: Vitals:   02/20/2019 0400 02-20-2019 0600 2019/02/20 0730 February 20, 2019 0822  BP: (!) 84/55 (!) 109/57 (!) 105/55   Pulse: 83 82 82   Resp: 20 20 (!) 21   Temp:   99 F (37.2 C)   TempSrc:   Axillary   SpO2: 93% 95% 99% 99%    Intake/Output Summary (Last 24 hours) at 02-20-19 1020 Last data filed at 02/04/2019 1600 Gross per 24 hour  Intake 450 ml  Output 0 ml  Net 450 ml   There were no vitals filed for this visit.  Examination:  Constitutional: Obtunded Eyes: No scleral icterus ENMT: Mucous membranes are dry Neck: normal, supple Respiratory: Coarse breath sounds bilaterally, gurgling sounds throughout Cardiovascular: Regular rate and rhythm, no murmurs, no edema Abdomen: Soft, positive bowel sounds Skin: No rashes seen Neurologic: Obtunded   Data Reviewed: I have independently reviewed following  labs and imaging studies   CBC: Recent Labs  Lab 02/09/19 1926 03/14/2019 0510  WBC 8.8 4.2  NEUTROABS 8.3* 3.6  HGB 8.7* 8.5*  HCT 30.2* 30.2*  MCV 82.1 83.7  PLT 195  [id number]   Basic Metabolic Panel: Recent Labs  Lab 02/09/19 1926 Mar 14, 2019 0510  NA 149* 155*  K 5.5* 6.2*  CL 117* 129*  CO2 22 19*  GLUCOSE 74 115*  BUN 55* 99*  CREATININE 1.76* 2.46*  CALCIUM 9.5 8.0*   GFR: Estimated Creatinine Clearance: 23.2 mL/min (A) (by C-G formula based on SCr of 2.46 mg/dL (H)). Liver Function Tests: Recent Labs  Lab 02/09/19 1926 14-Mar-2019 0510  AST 115* 72*  ALT 15 29  ALKPHOS 45 18*  BILITOT 1.5* 1.4*  PROT 6.0* <3.0*  ALBUMIN 2.2* <1.0*   No results for input(s): LIPASE, AMYLASE in the last 168 hours. No results for input(s): AMMONIA in the last 168 hours. Coagulation Profile: No results for input(s): INR, PROTIME in the last 168 hours. Cardiac Enzymes: No results for input(s): CKTOTAL, CKMB, CKMBINDEX, TROPONINI in the last 168 hours. BNP (last 3 results) No results for input(s): PROBNP in the last 8760 hours. HbA1C: No results for input(s): HGBA1C in the last 72 hours. CBG: Recent Labs  Lab 02/09/19 1927  GLUCAP 64*   Lipid Profile: No results for input(s): CHOL, HDL, LDLCALC, TRIG, CHOLHDL, LDLDIRECT in the last 72 hours. Thyroid Function Tests: No results for input(s): TSH, T4TOTAL, FREET4, T3FREE, THYROIDAB in the last 72 hours. Anemia Panel: Recent Labs    02/09/19 1926  FERRITIN 200   Urine analysis:    Component Value Date/Time   COLORURINE YELLOW (A) 02/01/2019 1107   APPEARANCEUR CLEAR (A) 02/01/2019 1107   APPEARANCEUR Cloudy (A) 01/12/2019 1541   LABSPEC 1.015 02/01/2019 1107   LABSPEC 1.018 06/23/2014 1229   PHURINE 5.0 02/01/2019 1107   GLUCOSEU NEGATIVE 02/01/2019 1107   GLUCOSEU 50 mg/dL 06/23/2014 1229   HGBUR NEGATIVE 02/01/2019 1107   [place] 02/01/2019 1107   BILIRUBINUR Negative 01/12/2019 1541   BILIRUBINUR Negative 06/23/2014 [place] 02/01/2019 1107   PROTEINUR NEGATIVE 02/01/2019 1107   NITRITE NEGATIVE 02/01/2019 1107   LEUKOCYTESUR NEGATIVE 02/01/2019 1107    LEUKOCYTESUR Trace 06/23/2014 1229   Sepsis Labs: Invalid input(s): PROCALCITONIN, LACTICIDVEN  No results found for this or any previous visit (from the past 240 hour(s)).    Radiology Studies: No results found.   Marzetta Board, MD, PhD [facility]  Contact via  [email or web address]  [place] P: [phone] F: [phone]

## 2019-02-15 NOTE — Progress Notes (Signed)
WASTE Wasted 20 mg IV morphine with Fredrich Romans RN charge nurse

## 2019-02-15 NOTE — Death Summary Note (Signed)
Death Summary  Sean Armstrong [id number] DOB: November 23, 1940 DOA: March 13, 2019  PCP: Albina Billet, MD  Admit date: 13-Mar-2019 Date of Death: 14-Mar-2019 Time of Death: around 8 pm Notification: Albina Billet, MD notified of death of March 15, 2019   History of present illness:  Per admitting MD HPI: Sean Armstrong is a 78 y.o. male with medical history significant of paroxysmal A. fib, Parkinson's disease, hypothyroidism, hypertension, hyperlipidemia, chronic kidney disease T3a, rheumatoid arthritis presented to the emergency room with shortness of breath, found to be profoundly hypoxic with chest x-ray showed bilateral infiltrates and required a nonrebreather.  Patient evaluated stat at bedside given unresponsiveness, and on my evaluation patient is moaning in response to deep sternal rub but requires a nonrebreather and appears encephalopathic on so history obtained per chart review, apparently he lives in [facility] and had a 24-hour decline in mental status with worsening breathing, requiring a nonrebreather and also becoming hypotensive with a blood pressure in the 90s. ED Course: In the emergency room he was febrile to 100.6, tachypneic breathing into the upper 20s, initially normotensive but then hypotensive, requiring 15 L high flow/nonrebreather.  Chest x-ray showed multifocal pneumonia.  Patient has a MOST form not to be transferred to another healthcare institution if comfort care measures could be provided at current location, however he eventually was transferred to [facility].  Final Diagnoses:  Principal Problem Acute hypoxic respiratory failure due to COVID-19 pneumonia / ARDS -Patient admitted to the hospital with profound hypoxia, he previously tested positive for the SARS-CoV-2 just before this hospitalization, on 02/01/2019.  He was initially started on remdesivir, steroids, fluids, however has not responded to treatment and after discussion with family he was transitioned to comfort  care Active problems Acute metabolic encephalopathy  Sepsis due to COVID-19  Acute kidney injury on chronic kidney disease stage IIIa Hypernatremia  Hyperkalemia  Paroxysmal A. fib Parkinson's disease History of DVT Type 2 diabetes mellitus Hypertension   The results of significant diagnostics from this hospitalization (including imaging, microbiology, ancillary and laboratory) are listed below for reference.    Significant Diagnostic Studies: Dg Chest 1 View  Result Date: 02/01/2019 CLINICAL DATA:  Weakness.  Hypoglycemia. EXAM: CHEST  1 VIEW COMPARISON:  01/10/2018 FINDINGS: Stable borderline heart size. Stable vascular pedicle widening. Coronary stents are noted along the left heart border. History of multiple falls. No evidence of acute fracture. Remote lateral right eighth rib fracture. Probable trace right pleural effusion. No air bronchogram, edema, or pneumothorax. IMPRESSION: Possible trace right pleural effusion. Electronically Signed   By: Monte Fantasia M.D.   On: 02/01/2019 11:24   Dg Chest Portable 1 View  Result Date: 02/09/2019 CLINICAL DATA:  Shortness of breath. COVID pneumonia. Decline in mental status and respiratory effort. EXAM: PORTABLE CHEST 1 VIEW COMPARISON:  01/10/2018 FINDINGS: Bilateral airspace opacities may reflect multilobar bilateral pneumonia or noncardiogenic edema. Atherosclerotic calcification of the aortic arch. Upper normal heart size. No blunting of the costophrenic angles. Mild thoracic spondylosis. IMPRESSION: 1. Bilateral airspace opacities may reflect multilobar pneumonia or noncardiogenic edema. 2.  Aortic Atherosclerosis (ICD10-I70.0). Electronically Signed   By: Van Clines M.D.   On: 02/09/2019 19:47    Microbiology: No results found for this or any previous visit (from the past 240 hour(s)).   Labs: Basic Metabolic Panel: Recent Labs  Lab 02/09/19 1926 March 14, 2019 0510  NA 149* 155*  K 5.5* 6.2*  CL 117* 129*  CO2 22 19*    GLUCOSE 74 115*  BUN 55*  99*  CREATININE 1.76* 2.46*  CALCIUM 9.5 8.0*   Liver Function Tests: Recent Labs  Lab 02/09/19 1926 03-05-2019 0510  AST 115* 72*  ALT 15 29  ALKPHOS 45 18*  BILITOT 1.5* 1.4*  PROT 6.0* <3.0*  ALBUMIN 2.2* <1.0*   No results for input(s): LIPASE, AMYLASE in the last 168 hours. No results for input(s): AMMONIA in the last 168 hours. CBC: Recent Labs  Lab 02/09/19 1926 2019/03/05 0510  WBC 8.8 4.2  NEUTROABS 8.3* 3.6  HGB 8.7* 8.5*  HCT 30.2* 30.2*  MCV 82.1 83.7  PLT 195 200   Cardiac Enzymes: No results for input(s): CKTOTAL, CKMB, CKMBINDEX, TROPONINI in the last 168 hours. D-Dimer No results for input(s): DDIMER in the last 72 hours. BNP: Invalid input(s): POCBNP CBG: Recent Labs  Lab 02/09/19 1927  GLUCAP 64*   Anemia work up No results for input(s): VITAMINB12, FOLATE, FERRITIN, TIBC, IRON, RETICCTPCT in the last 72 hours. Urinalysis    Component Value Date/Time   COLORURINE YELLOW (A) 02/01/2019 1107   APPEARANCEUR CLEAR (A) 02/01/2019 1107   APPEARANCEUR Cloudy (A) 01/12/2019 1541   LABSPEC 1.015 02/01/2019 1107   LABSPEC 1.018 06/23/2014 1229   PHURINE 5.0 02/01/2019 1107   GLUCOSEU NEGATIVE 02/01/2019 1107   GLUCOSEU 50 mg/dL 06/23/2014 1229   HGBUR NEGATIVE 02/01/2019 1107   BILIRUBINUR NEGATIVE 02/01/2019 1107   BILIRUBINUR Negative 01/12/2019 1541   BILIRUBINUR Negative 06/23/2014 1229   KETONESUR NEGATIVE 02/01/2019 1107   PROTEINUR NEGATIVE 02/01/2019 1107   NITRITE NEGATIVE 02/01/2019 1107   LEUKOCYTESUR NEGATIVE 02/01/2019 1107   LEUKOCYTESUR Trace 06/23/2014 1229   Sepsis Labs Invalid input(s): PROCALCITONIN,  WBC,  LACTICIDVEN     SIGNED:  Marzetta Board, MD  [facility] 02/13/2019, 10:26 AM Pager   If 7PM-7AM, please contact night-coverage [email or web address] Password [email or web address]

## 2019-02-15 DEATH — deceased

## 2019-02-28 ENCOUNTER — Ambulatory Visit (INDEPENDENT_AMBULATORY_CARE_PROVIDER_SITE_OTHER): Payer: Medicare Other | Admitting: Vascular Surgery

## 2019-02-28 ENCOUNTER — Encounter (INDEPENDENT_AMBULATORY_CARE_PROVIDER_SITE_OTHER): Payer: Medicare Other

## 2019-09-05 IMAGING — CT CT ABD-PELV W/O CM
2 of 4 series · 16 of 46 positions shown, 18 images · non-contrast
Comparison: CT scan of August 08, 2016.

CLINICAL DATA: Constipation, acute generalized abdominal pain.

EXAM:
CT ABDOMEN AND PELVIS WITHOUT CONTRAST
TECHNIQUE: Multidetector CT imaging of the abdomen and pelvis was performed
following the standard protocol without IV contrast.

[Series 2: routine abd/pel wo · axial · 0.85mm/px · z∈[-524,-104]mm · 13 of 94 slices shown, 15 images]
[im 5/94  soft-tissue]
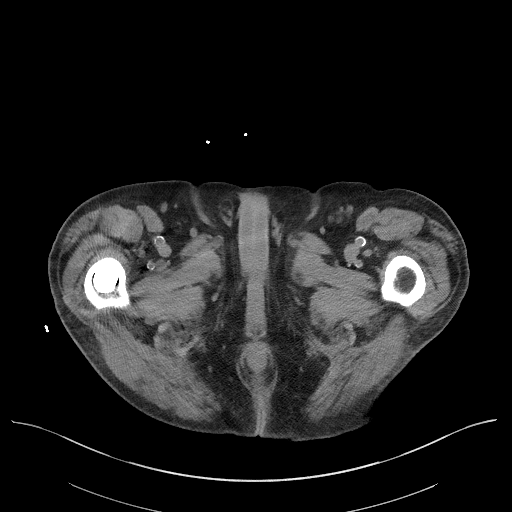
[im 5/94  bone]
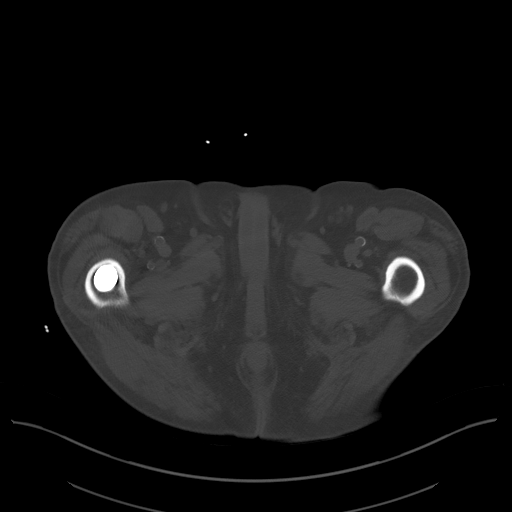
[im 13/94  soft-tissue]
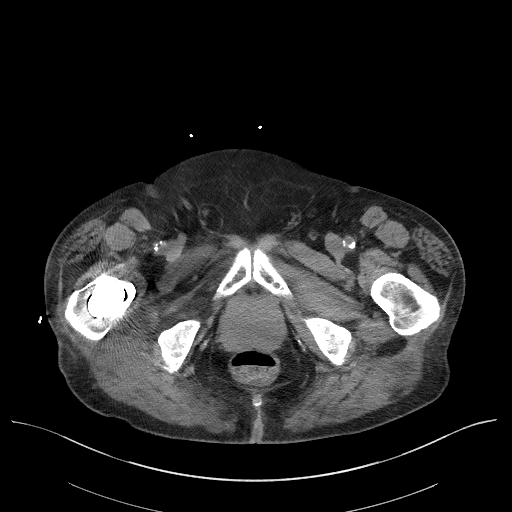
[im 21/94  soft-tissue]
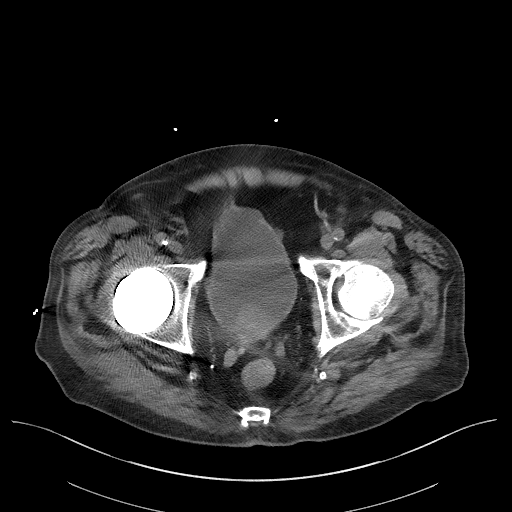
[im 25/94  soft-tissue]
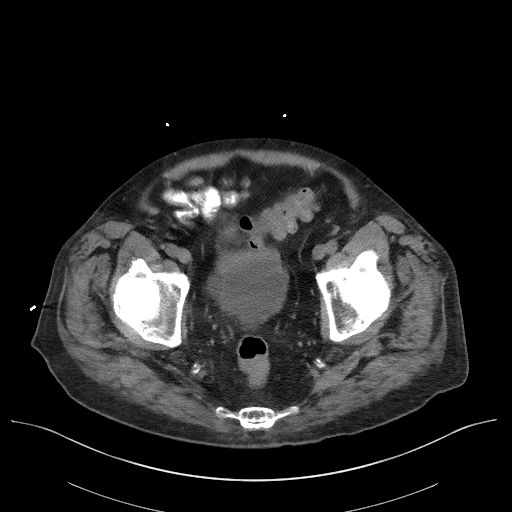
[im 33/94  soft-tissue]
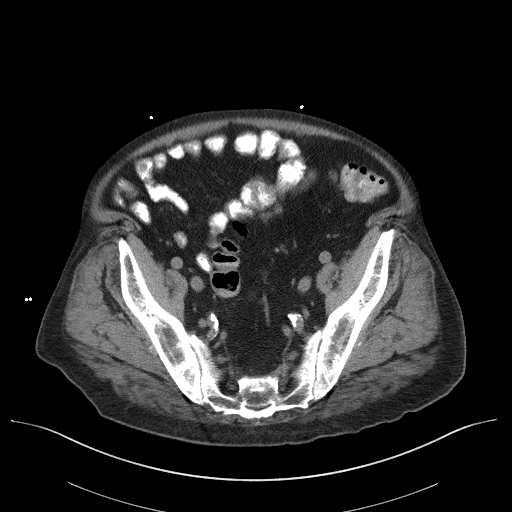
[im 41/94  soft-tissue]
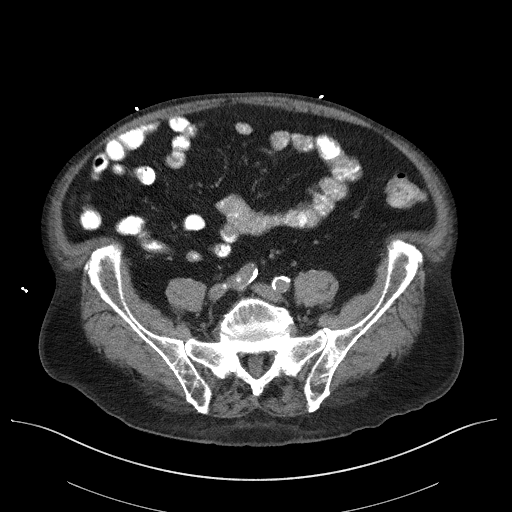
[im 49/94  soft-tissue]
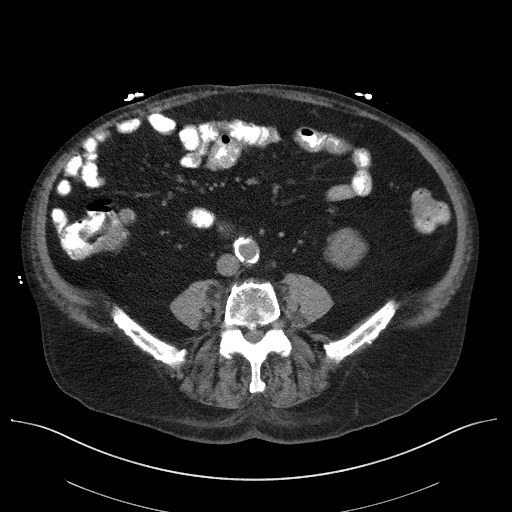
[im 53/94  soft-tissue]
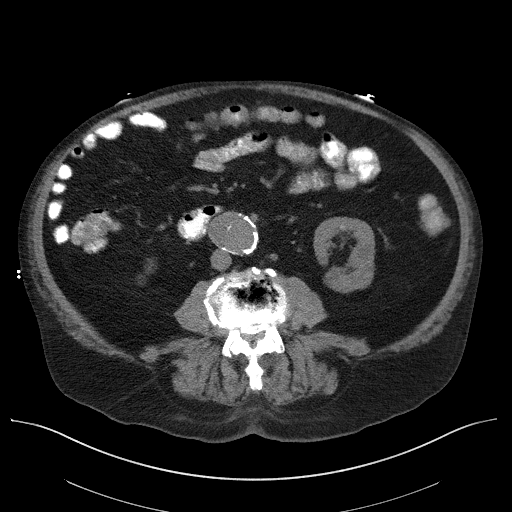
[im 61/94  soft-tissue]
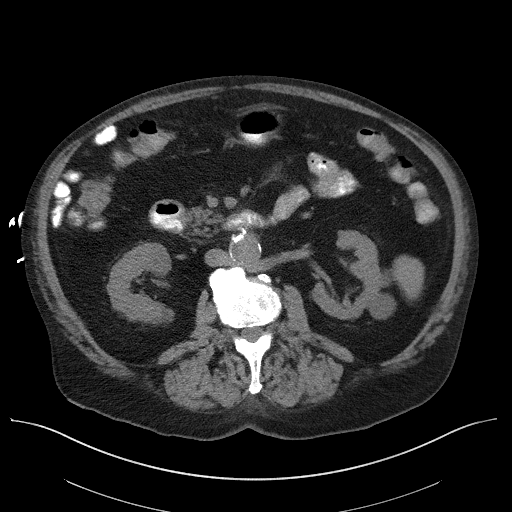
[im 61/94  bone]
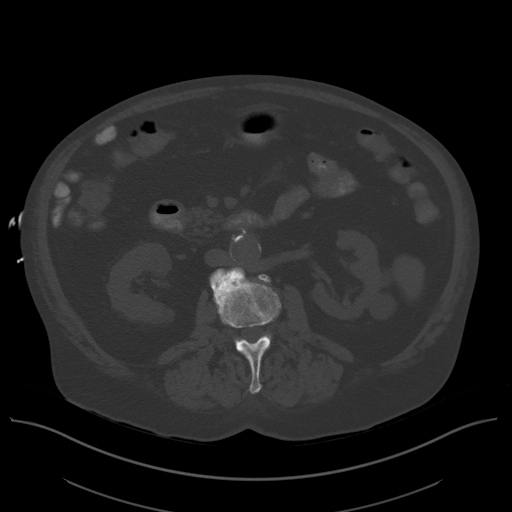
[im 69/94  soft-tissue]
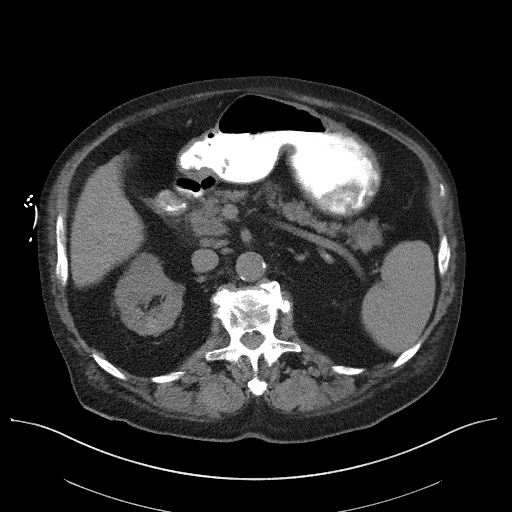
[im 73/94  soft-tissue]
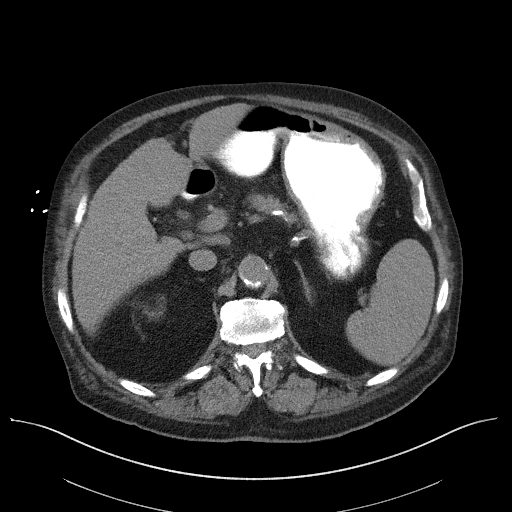
[im 81/94  soft-tissue]
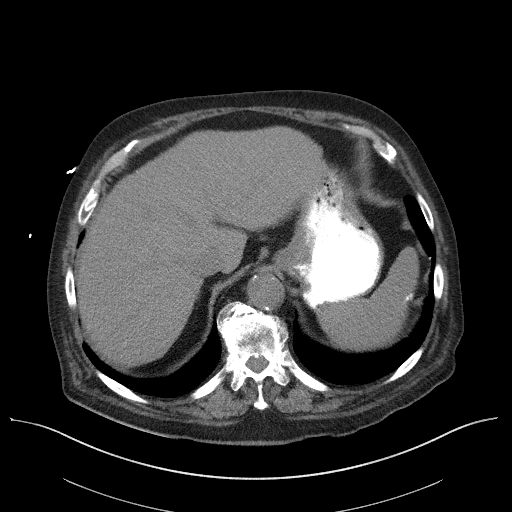
[im 89/94  soft-tissue]
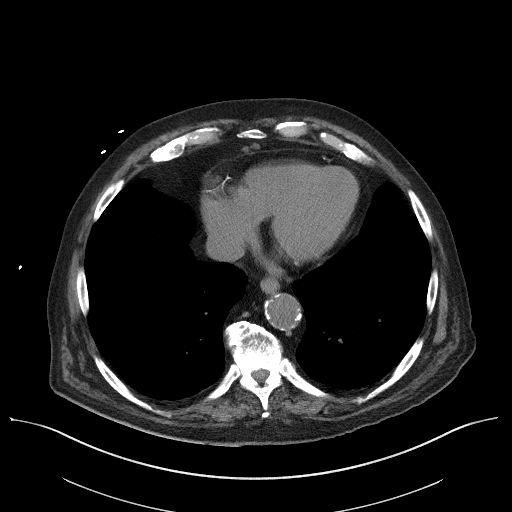

[Series 5: coronal st · coronal · 0.84mm/px · 3 of 107 slices shown]
[im 36/107  soft-tissue]
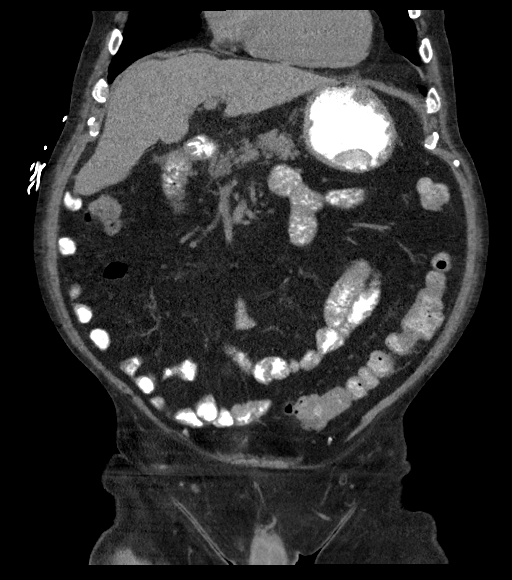
[im 48/107  soft-tissue]
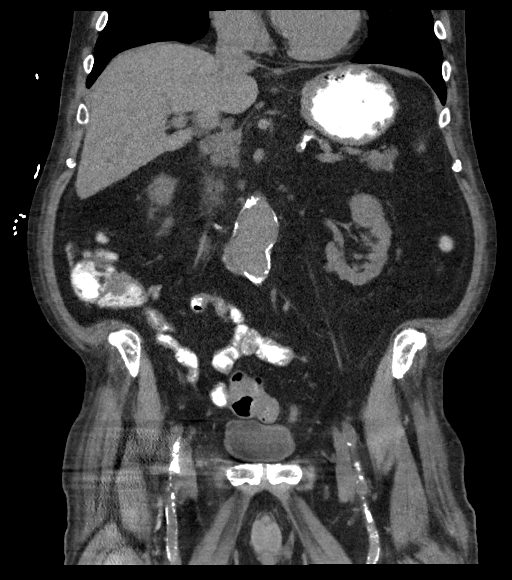
[im 59/107  soft-tissue]
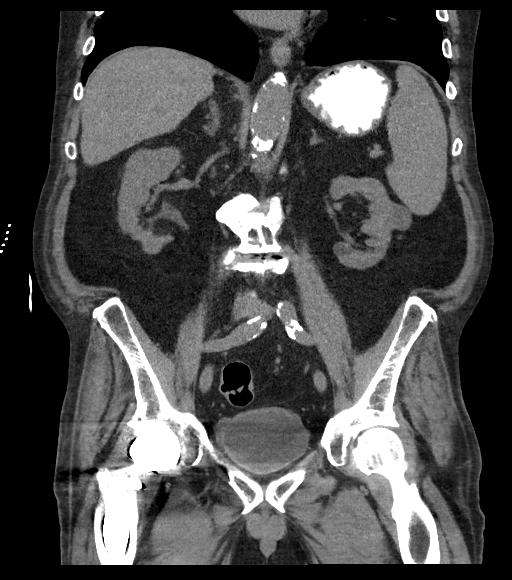

[16 of 46 positions shown; findings below may reference images not displayed]

FINDINGS: Lower chest: No acute abnormality.

Hepatobiliary: Status post cholecystectomy. Nodular hepatic contours
are noted concerning for hepatic cirrhosis. No focal abnormality is
noted on these unenhanced images.

Pancreas: Stable 1.8 cm cystic lesions seen in pancreatic tail. No
inflammation or ductal dilatation is noted.

Spleen: Normal in size without focal abnormality.

Adrenals/Urinary Tract: Adrenal glands appear normal. Stable
bilateral renal cysts and cortical scarring is noted. Nonobstructive
right renal calculus is noted. No hydronephrosis or renal
obstruction is noted. No ureteral calculi are noted. Multiple small
bladder diverticula are noted.

Stomach/Bowel: Stomach is within normal limits. Appendix appears
normal. No evidence of bowel wall thickening, distention, or
inflammatory changes. No significant stool burden is noted.

Vascular/Lymphatic: Atherosclerosis of abdominal aorta is noted.
cm infrarenal abdominal aortic aneurysm is noted. No significant
adenopathy is noted.

Reproductive: Moderate prostatic enlargement is noted.

Other: No abdominal wall hernia or abnormality. No abdominopelvic
ascites.

Musculoskeletal: No acute or significant osseous findings.
IMPRESSION: Findings consistent with hepatic cirrhosis.

Stable 1.8 cm cystic lesion seen in pancreatic tail. Recommend
follow up pre and post contrast MRI/MRCP or pancreatic protocol CT
in 1 year. This recommendation follows ACR consensus guidelines:
Management of Incidental Pancreatic Cysts: A White Paper of the ACR
Incidental Findings Committee. [HOSPITAL] [id number];[DATE].

Nonobstructive right renal calculus. No hydronephrosis or renal
obstruction is noted.

Stable prostatic enlargement.

Aortic atherosclerosis.

Stable 4.1 cm infrarenal abdominal aortic aneurysm. Recommend
followup by ultrasound in 1 year. This recommendation follows ACR
consensus guidelines: White Paper of the ACR Incidental Findings
Committee II on Vascular Findings. [HOSPITAL] [id number];
[DATE].

No acute abnormality seen in the abdomen or pelvis.
